# Patient Record
Sex: Female | Born: 1970 | Race: Black or African American | Hispanic: No | Marital: Single | Smoking: Current every day smoker
Health system: Southern US, Community
[De-identification: ages and names within clinical notes are randomized; demographics above are authoritative.]

## PROBLEM LIST (undated history)

## (undated) DIAGNOSIS — J449 Chronic obstructive pulmonary disease, unspecified: Secondary | ICD-10-CM

## (undated) DIAGNOSIS — K8020 Calculus of gallbladder without cholecystitis without obstruction: Secondary | ICD-10-CM

## (undated) DIAGNOSIS — F909 Attention-deficit hyperactivity disorder, unspecified type: Secondary | ICD-10-CM

## (undated) DIAGNOSIS — G629 Polyneuropathy, unspecified: Secondary | ICD-10-CM

## (undated) DIAGNOSIS — E781 Pure hyperglyceridemia: Secondary | ICD-10-CM

## (undated) DIAGNOSIS — F4310 Post-traumatic stress disorder, unspecified: Secondary | ICD-10-CM

## (undated) DIAGNOSIS — F32A Depression, unspecified: Secondary | ICD-10-CM

## (undated) DIAGNOSIS — F329 Major depressive disorder, single episode, unspecified: Secondary | ICD-10-CM

## (undated) DIAGNOSIS — F319 Bipolar disorder, unspecified: Secondary | ICD-10-CM

## (undated) DIAGNOSIS — E119 Type 2 diabetes mellitus without complications: Secondary | ICD-10-CM

## (undated) DIAGNOSIS — F419 Anxiety disorder, unspecified: Secondary | ICD-10-CM

## (undated) DIAGNOSIS — E785 Hyperlipidemia, unspecified: Secondary | ICD-10-CM

## (undated) HISTORY — DX: Post-traumatic stress disorder, unspecified: F43.10

## (undated) HISTORY — DX: Attention-deficit hyperactivity disorder, unspecified type: F90.9

---

## 1997-09-12 ENCOUNTER — Inpatient Hospital Stay (HOSPITAL_COMMUNITY): Admission: AD | Admit: 1997-09-12 | Discharge: 1997-09-12 | Payer: Self-pay | Admitting: Obstetrics

## 1998-03-04 ENCOUNTER — Inpatient Hospital Stay (HOSPITAL_COMMUNITY): Admission: AD | Admit: 1998-03-04 | Discharge: 1998-03-08 | Payer: Self-pay | Admitting: Obstetrics

## 1998-03-31 ENCOUNTER — Observation Stay (HOSPITAL_COMMUNITY): Admission: AD | Admit: 1998-03-31 | Discharge: 1998-04-01 | Payer: Self-pay | Admitting: Obstetrics

## 1998-04-13 ENCOUNTER — Inpatient Hospital Stay (HOSPITAL_COMMUNITY): Admission: AD | Admit: 1998-04-13 | Discharge: 1998-04-13 | Payer: Self-pay | Admitting: Obstetrics & Gynecology

## 1998-04-15 ENCOUNTER — Inpatient Hospital Stay (HOSPITAL_COMMUNITY): Admission: AD | Admit: 1998-04-15 | Discharge: 1998-04-17 | Payer: Self-pay | Admitting: Obstetrics

## 1998-05-22 ENCOUNTER — Emergency Department (HOSPITAL_COMMUNITY): Admission: EM | Admit: 1998-05-22 | Discharge: 1998-05-22 | Payer: Self-pay | Admitting: Emergency Medicine

## 1998-05-22 ENCOUNTER — Inpatient Hospital Stay (HOSPITAL_COMMUNITY): Admission: AD | Admit: 1998-05-22 | Discharge: 1998-05-25 | Payer: Self-pay | Admitting: Obstetrics

## 1998-05-23 ENCOUNTER — Encounter: Payer: Self-pay | Admitting: Obstetrics

## 1998-07-18 ENCOUNTER — Encounter: Admission: RE | Admit: 1998-07-18 | Discharge: 1998-07-18 | Payer: Self-pay | Admitting: Family Medicine

## 1998-09-12 ENCOUNTER — Encounter: Admission: RE | Admit: 1998-09-12 | Discharge: 1998-09-12 | Payer: Self-pay | Admitting: Family Medicine

## 2004-04-03 ENCOUNTER — Inpatient Hospital Stay (HOSPITAL_COMMUNITY): Admission: AD | Admit: 2004-04-03 | Discharge: 2004-04-03 | Payer: Self-pay | Admitting: Obstetrics & Gynecology

## 2004-04-14 ENCOUNTER — Ambulatory Visit: Payer: Self-pay | Admitting: Family Medicine

## 2004-04-29 ENCOUNTER — Ambulatory Visit: Payer: Self-pay | Admitting: Sports Medicine

## 2004-04-30 ENCOUNTER — Other Ambulatory Visit: Admission: RE | Admit: 2004-04-30 | Discharge: 2004-04-30 | Payer: Self-pay | Admitting: Family Medicine

## 2004-05-06 ENCOUNTER — Ambulatory Visit (HOSPITAL_COMMUNITY): Admission: RE | Admit: 2004-05-06 | Discharge: 2004-05-06 | Payer: Self-pay | Admitting: Family Medicine

## 2004-05-30 ENCOUNTER — Ambulatory Visit: Payer: Self-pay | Admitting: Sports Medicine

## 2004-06-17 ENCOUNTER — Ambulatory Visit: Payer: Self-pay | Admitting: Sports Medicine

## 2004-07-10 ENCOUNTER — Ambulatory Visit: Payer: Self-pay | Admitting: Family Medicine

## 2004-07-16 ENCOUNTER — Ambulatory Visit: Payer: Self-pay

## 2004-07-17 ENCOUNTER — Inpatient Hospital Stay (HOSPITAL_COMMUNITY): Admission: AD | Admit: 2004-07-17 | Discharge: 2004-07-17 | Payer: Self-pay | Admitting: *Deleted

## 2004-08-04 ENCOUNTER — Inpatient Hospital Stay (HOSPITAL_COMMUNITY): Admission: AD | Admit: 2004-08-04 | Discharge: 2004-08-04 | Payer: Self-pay | Admitting: Obstetrics and Gynecology

## 2004-08-04 ENCOUNTER — Ambulatory Visit: Payer: Self-pay | Admitting: Family Medicine

## 2004-09-02 ENCOUNTER — Observation Stay (HOSPITAL_COMMUNITY): Admission: AD | Admit: 2004-09-02 | Discharge: 2004-09-03 | Payer: Self-pay | Admitting: Family Medicine

## 2004-09-09 ENCOUNTER — Ambulatory Visit: Payer: Self-pay | Admitting: Family Medicine

## 2004-09-18 ENCOUNTER — Ambulatory Visit: Payer: Self-pay | Admitting: Sports Medicine

## 2004-10-09 ENCOUNTER — Ambulatory Visit: Payer: Self-pay | Admitting: Family Medicine

## 2004-10-09 ENCOUNTER — Inpatient Hospital Stay (HOSPITAL_COMMUNITY): Admission: AD | Admit: 2004-10-09 | Discharge: 2004-10-11 | Payer: Self-pay | Admitting: Obstetrics & Gynecology

## 2005-01-02 ENCOUNTER — Ambulatory Visit: Payer: Self-pay | Admitting: Sports Medicine

## 2005-11-11 ENCOUNTER — Encounter: Payer: Self-pay | Admitting: Obstetrics

## 2005-12-11 ENCOUNTER — Encounter (INDEPENDENT_AMBULATORY_CARE_PROVIDER_SITE_OTHER): Payer: Self-pay | Admitting: *Deleted

## 2005-12-11 LAB — CONVERTED CEMR LAB

## 2006-02-08 ENCOUNTER — Ambulatory Visit: Payer: Self-pay | Admitting: *Deleted

## 2006-02-08 ENCOUNTER — Inpatient Hospital Stay (HOSPITAL_COMMUNITY): Admission: AD | Admit: 2006-02-08 | Discharge: 2006-02-08 | Payer: Self-pay | Admitting: Family Medicine

## 2006-06-18 ENCOUNTER — Encounter (INDEPENDENT_AMBULATORY_CARE_PROVIDER_SITE_OTHER): Payer: Self-pay | Admitting: *Deleted

## 2006-06-18 ENCOUNTER — Ambulatory Visit: Payer: Self-pay | Admitting: Obstetrics & Gynecology

## 2006-06-18 ENCOUNTER — Inpatient Hospital Stay (HOSPITAL_COMMUNITY): Admission: AD | Admit: 2006-06-18 | Discharge: 2006-06-19 | Payer: Self-pay | Admitting: Obstetrics & Gynecology

## 2006-07-19 ENCOUNTER — Ambulatory Visit: Payer: Self-pay | Admitting: Family Medicine

## 2006-09-10 ENCOUNTER — Encounter (INDEPENDENT_AMBULATORY_CARE_PROVIDER_SITE_OTHER): Payer: Self-pay | Admitting: *Deleted

## 2006-10-07 ENCOUNTER — Ambulatory Visit: Payer: Self-pay | Admitting: Family Medicine

## 2006-12-28 ENCOUNTER — Ambulatory Visit: Payer: Self-pay | Admitting: Family Medicine

## 2007-03-28 ENCOUNTER — Ambulatory Visit: Payer: Self-pay | Admitting: Family Medicine

## 2007-04-08 ENCOUNTER — Emergency Department (HOSPITAL_COMMUNITY): Admission: EM | Admit: 2007-04-08 | Discharge: 2007-04-08 | Payer: Self-pay | Admitting: Emergency Medicine

## 2007-04-11 ENCOUNTER — Telehealth: Payer: Self-pay | Admitting: *Deleted

## 2007-04-12 ENCOUNTER — Ambulatory Visit: Payer: Self-pay | Admitting: Family Medicine

## 2007-04-12 DIAGNOSIS — S46819A Strain of other muscles, fascia and tendons at shoulder and upper arm level, unspecified arm, initial encounter: Secondary | ICD-10-CM | POA: Insufficient documentation

## 2007-04-19 ENCOUNTER — Encounter: Payer: Self-pay | Admitting: *Deleted

## 2007-06-27 ENCOUNTER — Ambulatory Visit: Payer: Self-pay | Admitting: Family Medicine

## 2007-09-20 ENCOUNTER — Ambulatory Visit: Payer: Self-pay | Admitting: Family Medicine

## 2008-07-22 ENCOUNTER — Emergency Department (HOSPITAL_COMMUNITY): Admission: EM | Admit: 2008-07-22 | Discharge: 2008-07-22 | Payer: Self-pay | Admitting: Emergency Medicine

## 2008-09-26 ENCOUNTER — Emergency Department (HOSPITAL_COMMUNITY): Admission: EM | Admit: 2008-09-26 | Discharge: 2008-09-26 | Payer: Self-pay | Admitting: Emergency Medicine

## 2008-10-16 ENCOUNTER — Telehealth: Payer: Self-pay | Admitting: Family Medicine

## 2008-10-17 ENCOUNTER — Telehealth: Payer: Self-pay | Admitting: Family Medicine

## 2008-10-18 ENCOUNTER — Ambulatory Visit: Payer: Self-pay | Admitting: Family Medicine

## 2008-10-18 DIAGNOSIS — F329 Major depressive disorder, single episode, unspecified: Secondary | ICD-10-CM

## 2008-10-18 DIAGNOSIS — R05 Cough: Secondary | ICD-10-CM

## 2008-10-18 DIAGNOSIS — F17200 Nicotine dependence, unspecified, uncomplicated: Secondary | ICD-10-CM

## 2008-10-18 DIAGNOSIS — R059 Cough, unspecified: Secondary | ICD-10-CM | POA: Insufficient documentation

## 2009-04-25 ENCOUNTER — Emergency Department (HOSPITAL_COMMUNITY): Admission: EM | Admit: 2009-04-25 | Discharge: 2009-04-25 | Payer: Self-pay | Admitting: Emergency Medicine

## 2009-05-06 ENCOUNTER — Other Ambulatory Visit: Payer: Self-pay

## 2009-05-06 ENCOUNTER — Other Ambulatory Visit: Payer: Self-pay | Admitting: Emergency Medicine

## 2009-05-06 ENCOUNTER — Ambulatory Visit: Payer: Self-pay | Admitting: Family Medicine

## 2009-05-06 DIAGNOSIS — R109 Unspecified abdominal pain: Secondary | ICD-10-CM | POA: Insufficient documentation

## 2009-05-07 ENCOUNTER — Inpatient Hospital Stay (HOSPITAL_COMMUNITY): Admission: AD | Admit: 2009-05-07 | Discharge: 2009-05-10 | Payer: Self-pay | Admitting: Psychiatry

## 2009-05-07 ENCOUNTER — Other Ambulatory Visit: Payer: Self-pay | Admitting: Emergency Medicine

## 2009-05-07 ENCOUNTER — Ambulatory Visit: Payer: Self-pay | Admitting: Psychiatry

## 2009-05-09 ENCOUNTER — Encounter: Payer: Self-pay | Admitting: Family Medicine

## 2009-05-13 ENCOUNTER — Encounter: Payer: Self-pay | Admitting: *Deleted

## 2009-06-02 ENCOUNTER — Emergency Department (HOSPITAL_COMMUNITY): Admission: EM | Admit: 2009-06-02 | Discharge: 2009-06-02 | Payer: Self-pay | Admitting: Emergency Medicine

## 2009-07-31 ENCOUNTER — Inpatient Hospital Stay (HOSPITAL_COMMUNITY): Admission: AD | Admit: 2009-07-31 | Discharge: 2009-08-01 | Payer: Self-pay | Admitting: Obstetrics & Gynecology

## 2010-03-26 ENCOUNTER — Emergency Department (HOSPITAL_COMMUNITY): Admission: EM | Admit: 2010-03-26 | Discharge: 2010-03-26 | Payer: Self-pay | Admitting: Emergency Medicine

## 2010-03-28 ENCOUNTER — Telehealth: Payer: Self-pay | Admitting: *Deleted

## 2010-03-28 ENCOUNTER — Emergency Department (HOSPITAL_COMMUNITY): Admission: EM | Admit: 2010-03-28 | Discharge: 2010-03-28 | Payer: Self-pay | Admitting: Family Medicine

## 2010-04-02 ENCOUNTER — Encounter: Payer: Self-pay | Admitting: Family Medicine

## 2010-04-02 ENCOUNTER — Ambulatory Visit: Payer: Self-pay | Admitting: Family Medicine

## 2010-04-02 DIAGNOSIS — R5383 Other fatigue: Secondary | ICD-10-CM

## 2010-04-02 DIAGNOSIS — E049 Nontoxic goiter, unspecified: Secondary | ICD-10-CM | POA: Insufficient documentation

## 2010-04-02 DIAGNOSIS — R5381 Other malaise: Secondary | ICD-10-CM

## 2010-04-04 ENCOUNTER — Telehealth: Payer: Self-pay | Admitting: *Deleted

## 2010-04-04 LAB — CONVERTED CEMR LAB
ALT: 22 units/L (ref 0–35)
AST: 20 units/L (ref 0–37)
CO2: 23 meq/L (ref 19–32)
Calcium: 9.4 mg/dL (ref 8.4–10.5)
Chloride: 106 meq/L (ref 96–112)
Platelets: 215 10*3/uL (ref 150–400)
RDW: 15.1 % (ref 11.5–15.5)
Sodium: 140 meq/L (ref 135–145)
TSH: 2.195 microintl units/mL (ref 0.350–4.500)
Total Protein: 6.5 g/dL (ref 6.0–8.3)
WBC: 6 10*3/uL (ref 4.0–10.5)
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
## 2010-08-12 NOTE — Progress Notes (Signed)
  Phone Note Outgoing Call   Call placed by: Loralee Pacas CMA,  April 04, 2010 3:36 PM Summary of Call: called and informed pt of nl tsh results and she can repeat in 6 months  Initial call taken by: Loralee Pacas CMA,  April 04, 2010 3:42 PM  Follow-up for Phone Call        thanks

## 2010-09-28 LAB — COMPREHENSIVE METABOLIC PANEL
ALT: 39 U/L — ABNORMAL HIGH (ref 0–35)
BUN: 9 mg/dL (ref 6–23)
CO2: 26 mEq/L (ref 19–32)
Calcium: 9.6 mg/dL (ref 8.4–10.5)
Creatinine, Ser: 1.12 mg/dL (ref 0.4–1.2)
GFR calc non Af Amer: 54 mL/min — ABNORMAL LOW (ref >60)
Glucose, Bld: 99 mg/dL (ref 70–99)
Sodium: 138 mEq/L (ref 135–145)
Total Protein: 6.7 g/dL (ref 6.0–8.3)

## 2010-09-28 LAB — DIFFERENTIAL
Eosinophils Absolute: 0.1 10*3/uL (ref 0.0–0.7)
Lymphocytes Relative: 28 % (ref 12–46)
Lymphs Abs: 2.9 10*3/uL (ref 0.7–4.0)
Monocytes Relative: 9 % (ref 3–12)
Neutro Abs: 6.2 10*3/uL (ref 1.7–7.7)
Neutrophils Relative %: 61 % (ref 43–77)

## 2010-09-28 LAB — WET PREP, GENITAL: Clue Cells Wet Prep HPF POC: NONE SEEN

## 2010-09-28 LAB — CBC
HCT: 37 % (ref 36.0–46.0)
Hemoglobin: 12 g/dL (ref 12.0–15.0)
MCHC: 32.5 g/dL (ref 30.0–36.0)
MCV: 89.2 fL (ref 78.0–100.0)
RBC: 4.15 MIL/uL (ref 3.87–5.11)
RDW: 16.2 % — ABNORMAL HIGH (ref 11.5–15.5)

## 2010-09-28 LAB — URINALYSIS, ROUTINE W REFLEX MICROSCOPIC
Bilirubin Urine: NEGATIVE
Ketones, ur: NEGATIVE mg/dL
Nitrite: POSITIVE — AB
Urobilinogen, UA: 0.2 mg/dL (ref 0.0–1.0)
pH: 5.5 (ref 5.0–8.0)

## 2010-09-28 LAB — GC/CHLAMYDIA PROBE AMP, GENITAL
Chlamydia, DNA Probe: NEGATIVE
GC Probe Amp, Genital: NEGATIVE

## 2010-09-28 LAB — POCT PREGNANCY, URINE: Preg Test, Ur: NEGATIVE

## 2010-09-28 LAB — URINE MICROSCOPIC-ADD ON

## 2010-10-16 LAB — RAPID URINE DRUG SCREEN, HOSP PERFORMED
Amphetamines: NOT DETECTED
Barbiturates: POSITIVE — AB
Cocaine: NOT DETECTED
Opiates: NOT DETECTED
Tetrahydrocannabinol: POSITIVE — AB

## 2010-10-16 LAB — BASIC METABOLIC PANEL
BUN: 11 mg/dL (ref 6–23)
CO2: 26 mEq/L (ref 19–32)
Calcium: 9.2 mg/dL (ref 8.4–10.5)
Chloride: 106 mEq/L (ref 96–112)
Creatinine, Ser: 1 mg/dL (ref 0.4–1.2)
GFR calc Af Amer: >60 mL/min (ref >60)
GFR calc non Af Amer: >60 mL/min (ref >60)
Glucose, Bld: 109 mg/dL — ABNORMAL HIGH (ref 70–99)
Potassium: 3.9 mEq/L (ref 3.5–5.1)
Sodium: 138 mEq/L (ref 135–145)

## 2010-10-16 LAB — CBC
HCT: 34.6 % — ABNORMAL LOW (ref 36.0–46.0)
Hemoglobin: 11.7 g/dL — ABNORMAL LOW (ref 12.0–15.0)
MCHC: 33.9 g/dL (ref 30.0–36.0)
MCV: 92.3 fL (ref 78.0–100.0)
Platelets: 246 10*3/uL (ref 150–400)
RBC: 3.75 MIL/uL — ABNORMAL LOW (ref 3.87–5.11)
RDW: 14.7 % (ref 11.5–15.5)
WBC: 6.6 10*3/uL (ref 4.0–10.5)

## 2010-10-16 LAB — DIFFERENTIAL
Basophils Absolute: 0.1 10*3/uL (ref 0.0–0.1)
Basophils Relative: 1 % (ref 0–1)
Eosinophils Absolute: 0.1 10*3/uL (ref 0.0–0.7)
Eosinophils Relative: 2 % (ref 0–5)
Lymphocytes Relative: 47 % — ABNORMAL HIGH (ref 12–46)
Lymphs Abs: 3.1 10*3/uL (ref 0.7–4.0)
Monocytes Absolute: 0.7 10*3/uL (ref 0.1–1.0)
Monocytes Relative: 11 % (ref 3–12)
Neutro Abs: 2.6 10*3/uL (ref 1.7–7.7)
Neutrophils Relative %: 40 % — ABNORMAL LOW (ref 43–77)

## 2010-10-23 LAB — DIFFERENTIAL
Basophils Relative: 0 % (ref 0–1)
Lymphocytes Relative: 12 % (ref 12–46)
Lymphs Abs: 1.7 10*3/uL (ref 0.7–4.0)
Monocytes Relative: 9 % (ref 3–12)
Neutro Abs: 11.5 10*3/uL — ABNORMAL HIGH (ref 1.7–7.7)
Neutrophils Relative %: 80 % — ABNORMAL HIGH (ref 43–77)

## 2010-10-23 LAB — URINALYSIS, ROUTINE W REFLEX MICROSCOPIC
Glucose, UA: NEGATIVE mg/dL
Protein, ur: 30 mg/dL — AB

## 2010-10-23 LAB — COMPREHENSIVE METABOLIC PANEL
ALT: 30 U/L (ref 0–35)
AST: 26 U/L (ref 0–37)
Albumin: 3.9 g/dL (ref 3.5–5.2)
Calcium: 9.3 mg/dL (ref 8.4–10.5)
GFR calc Af Amer: >60 mL/min (ref >60)
Sodium: 136 mEq/L (ref 135–145)
Total Protein: 7.1 g/dL (ref 6.0–8.3)

## 2010-10-23 LAB — CBC
MCHC: 34 g/dL (ref 30.0–36.0)
Platelets: 169 10*3/uL (ref 150–400)
RDW: 14.6 % (ref 11.5–15.5)

## 2010-10-23 LAB — URINE MICROSCOPIC-ADD ON

## 2010-10-23 LAB — POCT PREGNANCY, URINE: Preg Test, Ur: NEGATIVE

## 2010-11-28 NOTE — Discharge Summary (Signed)
NAME:  Stacy Pierce, Stacy Pierce NO.:  [id number]   MEDICAL RECORD NO.:  [id number]          PATIENT TYPE:  INP   LOCATION:  9151                          FACILITY:  [facility]   PHYSICIAN:  Conni Elliot, M.D.DATE OF BIRTH:  1971-05-16   DATE OF ADMISSION:  09/02/2004  DATE OF DISCHARGE:  09/03/2004                                 DISCHARGE SUMMARY   ADMISSION DIAGNOSES:  38.  40 year old gravida 4, para 3-0-0-3, at 35-3/7 weeks with cough,      vomiting, and diarrhea.  2.  Positive tobacco use.  3.  Failed one-hour Glucola.  4.  History of placenta previa.   DISCHARGE DIAGNOSES:  34.  40 year old gravida 4, para 3-0-0-3, at 35-4/7 weeks with resolved      vomiting and diarrhea.  2.  Acute bronchitis.  3.  Tobacco abuse.  4.  Failed one-hour Glucola.  5.  History of placenta previa.  6.  Reassuring fetal heart rate tracing.   DISCHARGE MEDICATIONS:  1.  Zithromax 250 mg one p.o. daily x4 more days.  2.  Prenatal vitamins one p.o. daily.   HISTORY OF PRESENT ILLNESS:  Ms. Stacy Pierce were admitted to [facility]  with symptoms of acute bronchitis with a history of tobacco use as well as  vomiting and diarrhea.  She had greater than 80 ketones in her urine, both  before and after 2 liters of IV fluids.  She was admitted for rehydration  and antibiotics.   HOSPITAL COURSE:  Problem 1.  Acute bronchitis.  Her chest x-ray was within  normal limits.  Her lung examination did show rhonchi bilaterally.  She was  not hypoxic.  She was started on 500 mg of IV Zithromax and she received  that dose once.  She remained afebrile throughout her hospitalization.   Problem 2.  Vomiting and diarrhea.  She did come in obvious dehydrated.  Her  ketones resolved after IV fluids for about 12 hours.  Prior to her discharge  on hospital day #1, she was taking solid p.o. without nausea or vomiting.   Problem 3.  History of a failed one-hour Glucola.  She is to have a three-  hour  scheduled.  She should be recommended an ADA diet to follow regardless  of whether or not she has the three-hour test since her one-hour was so  high.   Problem 4.  Tobacco use.  She was recommended smoking cessation.  The  patient states that she does want to try.   Problem 5.  History of placenta previa.  An ultrasound was not done today  since she had no complaints of vaginal bleeding, loss of fluid, or  contractions.  This will also need to be followed up as an outpatient.   CONDITION ON DISCHARGE:  The patient was discharged to home in stable  condition.   DISCHARGE INSTRUCTIONS:  The patient was told of the above regimen as well  as smoking cessation and low carbohydrate diet.  She was told to make an  appointment with Hansel Feinstein, M.D. if she does not already have one  scheduled.      LC/MEDQ  D:  09/03/2004  T:  09/04/2004  Job:  [id number]   cc:   Hansel Feinstein, M.D.

## 2011-04-17 ENCOUNTER — Emergency Department (HOSPITAL_COMMUNITY): Payer: Self-pay

## 2011-04-17 ENCOUNTER — Emergency Department (HOSPITAL_COMMUNITY)
Admission: EM | Admit: 2011-04-17 | Discharge: 2011-04-18 | Disposition: A | Discharge status: Home / Self Care | Payer: Self-pay | Attending: Emergency Medicine | Admitting: Emergency Medicine

## 2011-04-17 ENCOUNTER — Emergency Department (HOSPITAL_COMMUNITY)
Admission: EM | Admit: 2011-04-17 | Discharge: 2011-04-17 | Disposition: A | Discharge status: Home / Self Care | Payer: Self-pay | Attending: Emergency Medicine | Admitting: Emergency Medicine

## 2011-04-17 DIAGNOSIS — J40 Bronchitis, not specified as acute or chronic: Secondary | ICD-10-CM | POA: Insufficient documentation

## 2011-04-17 DIAGNOSIS — R05 Cough: Secondary | ICD-10-CM | POA: Insufficient documentation

## 2011-04-17 DIAGNOSIS — R509 Fever, unspecified: Secondary | ICD-10-CM | POA: Insufficient documentation

## 2011-04-17 DIAGNOSIS — R062 Wheezing: Secondary | ICD-10-CM | POA: Insufficient documentation

## 2011-04-17 DIAGNOSIS — R059 Cough, unspecified: Secondary | ICD-10-CM | POA: Insufficient documentation

## 2011-04-17 DIAGNOSIS — R0602 Shortness of breath: Secondary | ICD-10-CM | POA: Insufficient documentation

## 2011-08-14 ENCOUNTER — Other Ambulatory Visit: Payer: Self-pay | Admitting: Family Medicine

## 2011-11-07 HISTORY — PX: CHOLECYSTECTOMY: SHX55

## 2011-11-08 ENCOUNTER — Emergency Department (HOSPITAL_COMMUNITY): Payer: Medicaid Other

## 2011-11-08 ENCOUNTER — Emergency Department (HOSPITAL_COMMUNITY)
Admission: EM | Admit: 2011-11-08 | Discharge: 2011-11-08 | Disposition: A | Discharge status: Home / Self Care | Payer: Medicaid Other | Attending: Emergency Medicine | Admitting: Emergency Medicine

## 2011-11-08 ENCOUNTER — Encounter (HOSPITAL_COMMUNITY): Payer: Self-pay | Admitting: *Deleted

## 2011-11-08 DIAGNOSIS — K8020 Calculus of gallbladder without cholecystitis without obstruction: Secondary | ICD-10-CM | POA: Insufficient documentation

## 2011-11-08 DIAGNOSIS — R079 Chest pain, unspecified: Secondary | ICD-10-CM | POA: Insufficient documentation

## 2011-11-08 DIAGNOSIS — J449 Chronic obstructive pulmonary disease, unspecified: Secondary | ICD-10-CM | POA: Insufficient documentation

## 2011-11-08 DIAGNOSIS — R1011 Right upper quadrant pain: Secondary | ICD-10-CM | POA: Insufficient documentation

## 2011-11-08 DIAGNOSIS — J4489 Other specified chronic obstructive pulmonary disease: Secondary | ICD-10-CM | POA: Insufficient documentation

## 2011-11-08 DIAGNOSIS — K810 Acute cholecystitis: Secondary | ICD-10-CM

## 2011-11-08 LAB — DIFFERENTIAL
Eosinophils Absolute: 0.1 10*3/uL (ref 0.0–0.7)
Eosinophils Relative: 1 % (ref 0–5)
Lymphs Abs: 3.4 10*3/uL (ref 0.7–4.0)
Monocytes Relative: 9 % (ref 3–12)

## 2011-11-08 LAB — TROPONIN I: Troponin I: <0.3 ng/mL (ref <0.30)

## 2011-11-08 LAB — COMPREHENSIVE METABOLIC PANEL
BUN: 8 mg/dL (ref 6–23)
Calcium: 9.6 mg/dL (ref 8.4–10.5)
Creatinine, Ser: 0.82 mg/dL (ref 0.50–1.10)
GFR calc Af Amer: >90 mL/min (ref >90)
Glucose, Bld: 117 mg/dL — ABNORMAL HIGH (ref 70–99)
Total Protein: 7 g/dL (ref 6.0–8.3)

## 2011-11-08 LAB — LIPASE, BLOOD: Lipase: 27 U/L (ref 11–59)

## 2011-11-08 LAB — CBC
MCHC: 35.5 g/dL (ref 30.0–36.0)
RDW: 14 % (ref 11.5–15.5)

## 2011-11-08 LAB — CK TOTAL AND CKMB (NOT AT ARMC)
Relative Index: 1 (ref 0.0–2.5)
Total CK: 191 U/L — ABNORMAL HIGH (ref 7–177)

## 2011-11-08 MED ORDER — OXYCODONE-ACETAMINOPHEN 5-325 MG PO TABS: 2.0000 | ORAL_TABLET | ORAL | Status: DC | PRN

## 2011-11-08 MED ORDER — NICOTINE 21 MG/24HR TD PT24
21.0000 mg | MEDICATED_PATCH | Freq: Once | TRANSDERMAL | Status: DC
  Administered 2011-11-08: 21 mg via TRANSDERMAL

## 2011-11-08 MED ORDER — LORAZEPAM 2 MG/ML IJ SOLN
1.0000 mg | Freq: Once | INTRAMUSCULAR | Status: AC
  Administered 2011-11-08: 1 mg via INTRAVENOUS

## 2011-11-08 MED ORDER — ONDANSETRON HCL 4 MG/2ML IJ SOLN
INTRAMUSCULAR | Status: AC
  Administered 2011-11-08: 4 mg via INTRAVENOUS
  Filled 2011-11-08: qty 2

## 2011-11-08 MED ORDER — ALBUTEROL SULFATE (5 MG/ML) 0.5% IN NEBU
2.5000 mg | INHALATION_SOLUTION | Freq: Once | RESPIRATORY_TRACT | Status: AC
  Administered 2011-11-08: 2.5 mg via RESPIRATORY_TRACT
  Filled 2011-11-08: qty 0.5

## 2011-11-08 MED ORDER — HYDROMORPHONE HCL PF 1 MG/ML IJ SOLN
INTRAMUSCULAR | Status: AC
  Administered 2011-11-08: 0.5 mg via INTRAVENOUS
  Filled 2011-11-08: qty 1

## 2011-11-08 MED ORDER — HYDROMORPHONE HCL PF 1 MG/ML IJ SOLN
0.5000 mg | Freq: Once | INTRAMUSCULAR | Status: AC
  Administered 2011-11-08: 0.5 mg via INTRAVENOUS

## 2011-11-08 MED ORDER — ONDANSETRON HCL 4 MG PO TABS: 4.0000 mg | ORAL_TABLET | Freq: Four times a day (QID) | ORAL | Status: AC

## 2011-11-08 MED ORDER — NICOTINE 21 MG/24HR TD PT24
MEDICATED_PATCH | TRANSDERMAL | Status: AC
  Filled 2011-11-08: qty 1

## 2011-11-08 MED ORDER — LORAZEPAM 2 MG/ML IJ SOLN
INTRAMUSCULAR | Status: AC
  Filled 2011-11-08: qty 1

## 2011-11-08 MED ORDER — ONDANSETRON HCL 4 MG/2ML IJ SOLN
4.0000 mg | Freq: Once | INTRAMUSCULAR | Status: AC
  Administered 2011-11-08: 4 mg via INTRAVENOUS
  Filled 2011-11-08: qty 2

## 2011-11-08 MED ORDER — HYDROMORPHONE HCL PF 1 MG/ML IJ SOLN
1.0000 mg | Freq: Once | INTRAMUSCULAR | Status: AC
  Administered 2011-11-08: 1 mg via INTRAVENOUS
  Filled 2011-11-08: qty 1

## 2011-11-08 MED ORDER — ONDANSETRON HCL 4 MG/2ML IJ SOLN
4.0000 mg | Freq: Once | INTRAMUSCULAR | Status: AC
  Administered 2011-11-08: 4 mg via INTRAVENOUS

## 2011-11-08 NOTE — ED Notes (Signed)
Pt called out.  RN went into room and patient started yelling loudly at staff saying she needed to go smoke a cigarette and her mouth was dry. Explained to patient reasoning for keeping NPO and no smoking policy, offered nicotine patch and mouth swabs.  Pt continuing to yell at staff. Dr. Norlene Campbell into room and spoke with patient.  New orders for nicotine patch and ativan.  Gave pt mouth swabs and discussed plan of care.

## 2011-11-08 NOTE — Discharge Instructions (Signed)
Biliary Colic  Biliary colic is a steady or irregular pain in the upper abdomen. It is usually under the right side of the rib cage. It happens when gallstones interfere with the normal flow of bile from the gallbladder. Bile is a liquid that helps to digest fats. Bile is made in the liver and stored in the gallbladder. When you eat a meal, bile passes from the gallbladder through the cystic duct and the common bile duct into the small intestine. There, it mixes with partially digested food. If a gallstone blocks either of these ducts, the normal flow of bile is blocked. The muscle cells in the bile duct contract forcefully to try to move the stone. This causes the pain of biliary colic.  SYMPTOMS   A person with biliary colic usually complains of pain in the upper abdomen. This pain can be:   In the center of the upper abdomen just below the breastbone.   In the upper-right part of the abdomen, near the gallbladder and liver.   Spread back toward the right shoulder blade.   Nausea and vomiting.   The pain usually occurs after eating.   Biliary colic is usually triggered by the digestive system's demand for bile. The demand for bile is high after fatty meals. Symptoms can also occur when a person who has been fasting suddenly eats a very large meal. Most episodes of biliary colic pass after 1 to 5 hours. After the most intense pain passes, your abdomen may continue to ache mildly for about 24 hours.  DIAGNOSIS  After you describe your symptoms, your caregiver will perform a physical exam. He or she will pay attention to the upper right portion of your belly (abdomen). This is the area of your liver and gallbladder. An ultrasound will help your caregiver look for gallstones. Specialized scans of the gallbladder may also be done. Blood tests may be done, especially if you have fever or if your pain persists. PREVENTION  Biliary colic can be prevented by controlling the risk factors for gallstones.  Some of these risk factors, such as heredity, increasing age, and pregnancy are a normal part of life. Obesity and a high-fat diet are risk factors you can change through a healthy lifestyle. Women going through menopause who take hormone replacement therapy (estrogen) are also more likely to develop biliary colic. TREATMENT   Pain medication may be prescribed.   You may be encouraged to eat a fat-free diet.   If the first episode of biliary colic is severe, or episodes of colic keep retuning, surgery to remove the gallbladder (cholecystectomy) is usually recommended. This procedure can be done through small incisions using an instrument called a laparoscope. The procedure often requires a brief stay in the hospital. Some people can leave the hospital the same day. It is the most widely used treatment in people troubled by painful gallstones. It is effective and safe, with no complications in more than 90% of cases.   If surgery cannot be done, medication that dissolves gallstones may be used. This medication is expensive and can take months or years to work. Only small stones will dissolve.   Rarely, medication to dissolve gallstones is combined with a procedure called shock-wave lithotripsy. This procedure uses carefully aimed shock waves to break up gallstones. In many people treated with this procedure, gallstones form again within a few years.  PROGNOSIS  If gallstones block your cystic duct or common bile duct, you are at risk for repeated episodes of  biliary colic. There is also a 25% chance that you will develop a gallbladder infection(acute cholecystitis), or some other complication of gallstones within 10 to 20 years. If you have surgery, schedule it at a time that is convenient for you and at a time when you are not sick. HOME CARE INSTRUCTIONS   Drink plenty of clear fluids.   Avoid fatty, greasy or fried foods, or any foods that make your pain worse.   Take medications as directed.    SEEK MEDICAL CARE IF:   You develop a fever over 100.5 F (38.1 C).   Your pain gets worse over time.   You develop nausea that prevents you from eating and drinking.   You develop vomiting.  SEEK IMMEDIATE MEDICAL CARE IF:   You have continuous or severe belly (abdominal) pain which is not relieved with medications.   You develop nausea and vomiting which is not relieved with medications.   You have symptoms of biliary colic and you suddenly develop a fever and shaking chills. This may signal cholecystitis. Call your caregiver immediately.   You develop a yellow color to your skin or the white part of your eyes (jaundice).  Document Released: 11/30/2005 Document Revised: 06/18/2011 Document Reviewed: 02/09/2008 [facility] Patient Information 2012 [place], [place].  Cholelithiasis Cholelithiasis (also called gallstones) is a form of gallbladder disease where gallstones form in your gallbladder. The gallbladder is a non-essential organ that stores bile made in the liver, which helps digest fats. Gallstones begin as small crystals and slowly grow into stones. Gallstone pain occurs when the gallbladder spasms, and a gallstone is blocking the duct. Pain can also occur when a stone passes out of the duct.  Women are more likely to develop gallstones than men. Other factors that increase the risk of gallbladder disease are:  Having multiple pregnancies. Physicians sometimes advise removing diseased gallbladders before future pregnancies.   Obesity.   Diets heavy in fried foods and fat.   Increasing age (older than 90).   Prolonged use of medications containing female hormones.   Diabetes mellitus.   Rapid weight loss.   Family history of gallstones (heredity).  SYMPTOMS  Feeling sick to your stomach (nauseous).   Abdominal pain.   Yellowing of the skin (jaundice).   Sudden pain. It may persist from several minutes to several hours.   Worsening pain with deep breathing or  when jarred.   Fever.   Tenderness to the touch.  In some cases, when gallstones do not move into the bile duct, people have no pain or symptoms. These are called "silent" gallstones. TREATMENT In severe cases, emergency surgery may be required. HOME CARE INSTRUCTIONS   Only take over-the-counter or prescription medicines for pain, discomfort, or fever as directed by your caregiver.   Follow a low-fat diet until seen again. Fat causes the gallbladder to contract, which can result in pain.   Follow up as instructed. Attacks are almost always recurrent and surgery is usually required for permanent treatment.  SEEK IMMEDIATE MEDICAL CARE IF:   Your pain increases and is not controlled by medications.   You have an oral temperature above 102 F (38.9 C), not controlled by medication.   You develop nausea and vomiting.  MAKE SURE YOU:   Understand these instructions.   Will watch your condition.   Will get help right away if you are not doing well or get worse.  Document Released: 06/25/2005 Document Revised: 06/18/2011 Document Reviewed: 08/28/2010 [facility] Patient Information 2012 [place], [place].  Laparoscopic Cholecystectomy Laparoscopic cholecystectomy is surgery to remove the gallbladder. The gallbladder is located slightly to the right of center in the abdomen, behind the liver. It is a concentrating and storage sac for the bile produced in the liver. Bile aids in the digestion and absorption of fats. Gallbladder disease (cholecystitis) is an inflammation of your gallbladder. This condition is usually caused by a buildup of gallstones (cholelithiasis) in your gallbladder. Gallstones can block the flow of bile, resulting in inflammation and pain. In severe cases, emergency surgery may be required. When emergency surgery is not required, you will have time to prepare for the procedure. Laparoscopic surgery is an alternative to open surgery. Laparoscopic surgery usually has a  shorter recovery time. Your common bile duct may also need to be examined and explored. Your caregiver will discuss this with you if he or she feels this should be done. If stones are found in the common bile duct, they may be removed. LET YOUR CAREGIVER KNOW ABOUT:  Allergies to food or medicine.   Medicines taken, including vitamins, herbs, eyedrops, over-the-counter medicines, and creams.   Use of steroids (by mouth or creams).   Previous problems with anesthetics or numbing medicines.   History of bleeding problems or blood clots.   Previous surgery.   Other health problems, including diabetes and kidney problems.   Possibility of pregnancy, if this applies.  RISKS AND COMPLICATIONS All surgery is associated with risks. Some problems that may occur following this procedure include:  Infection.   Damage to the common bile duct, nerves, arteries, veins, or other internal organs such as the stomach or intestines.   Bleeding.   A stone may remain in the common bile duct.  BEFORE THE PROCEDURE  Do not take aspirin for 3 days prior to surgery or blood thinners for 1 week prior to surgery.   Do not eat or drink anything after midnight the night before surgery.   Let your caregiver know if you develop a cold or other infectious problem prior to surgery.   You should be present 60 minutes before the procedure or as directed.  PROCEDURE  You will be given medicine that makes you sleep (general anesthetic). When you are asleep, your surgeon will make several small cuts (incisions) in your abdomen. One of these incisions is used to insert a small, lighted scope (laparoscope) into the abdomen. The laparoscope helps the surgeon see into your abdomen. Carbon dioxide gas will be pumped into your abdomen. The gas allows more room for the surgeon to perform your surgery. Other operating instruments are inserted through the other incisions. Laparoscopic procedures may not be appropriate  when:  There is major scarring from previous surgery.   The gallbladder is extremely inflamed.   There are bleeding disorders or unexpected cirrhosis of the liver.   A pregnancy is near term.   Other conditions make the laparoscopic procedure impossible.  If your surgeon feels it is not safe to continue with a laparoscopic procedure, he or she will perform an open abdominal procedure. In this case, the surgeon will make an incision to open the abdomen. This gives the surgeon a larger view and field to work within. This may allow the surgeon to perform procedures that sometimes cannot be performed with a laparoscope alone. Open surgery has a longer recovery time. AFTER THE PROCEDURE  You will be taken to the recovery area where a nurse will watch and check your progress.   You may be allowed to go  home the same day.   Do not resume physical activities until directed by your caregiver.   You may resume a normal diet and activities as directed.  Document Released: 06/29/2005 Document Revised: 06/18/2011 Document Reviewed: 12/12/2010 [facility] Patient Information 2012 [place], [place].

## 2011-11-08 NOTE — ED Notes (Signed)
Pt called rn into room, emesis x1 noted and c/o chest pain. Currently awaiting EDP eval at this time

## 2011-11-08 NOTE — ED Provider Notes (Addendum)
History     CSN: [id number]  Arrival date & time 11/08/11  0040   First MD Initiated Contact with Patient 11/08/11 0054      Chief Complaint  Patient presents with  . Chest Pain    (Consider location/radiation/quality/duration/timing/severity/associated sxs/prior treatment) HPI 41 year old female presents to emergency department with complaint of one month of intermittent chest back and upper abdomen pain. Patient reports symptoms initially started after eating a barbecue a spicy peppers. Patient has had diaphoresis and nausea after eating along with the pain. Pain is mainly in right upper quadrant, radiates into her right shoulder and across her chest. Patient denies previous history of same. No fevers patient has had shortness of breath, but has history of COPD and is a smoker. No prior surgeries no family history of heart disease. Patient denies any leg swelling, not on birth control pills, no history of PE or DVT History reviewed. No pertinent past medical history.  History reviewed. No pertinent past surgical history.  No family history on file.  History  Substance Use Topics  . Smoking status: Current Everyday Smoker  . Smokeless tobacco: Not on file  . Alcohol Use: Yes    OB History    Grav Para Term Preterm Abortions TAB SAB Ect Mult Living                  Review of Systems  All other systems reviewed and are negative.    Allergies  Amoxicillin  Home Medications  No current outpatient prescriptions on file.  BP 120/84  Pulse 73  Temp(Src) 97.8 F (36.6 C) (Oral)  Resp 22  SpO2 99%  Physical Exam  Nursing note and vitals reviewed. Constitutional: She is oriented to person, place, and time. She appears well-developed and well-nourished.  HENT:  Head: Normocephalic and atraumatic.  Nose: Nose normal.  Mouth/Throat: Oropharynx is clear and moist.  Eyes: Conjunctivae and EOM are normal. Pupils are equal, round, and reactive to light.  Neck: Normal  range of motion. Neck supple. No JVD present. No tracheal deviation present. No thyromegaly present.  Cardiovascular: Normal rate, regular rhythm, normal heart sounds and intact distal pulses.  Exam reveals no gallop and no friction rub.   No murmur heard. Pulmonary/Chest: Effort normal. No stridor. No respiratory distress. She has wheezes. She has no rales. She exhibits no tenderness.  Abdominal: Soft. Bowel sounds are normal. She exhibits no distension and no mass. There is tenderness (significant tenderness in right upper quadrant with out rebound or guarding, tenderness in epigastrium as well). There is no rebound and no guarding.  Musculoskeletal: Normal range of motion. She exhibits no edema and no tenderness.  Lymphadenopathy:    She has no cervical adenopathy.  Neurological: She is oriented to person, place, and time. She exhibits normal muscle tone. Coordination normal.  Skin: Skin is dry. No rash noted. No erythema. No pallor.  Psychiatric: She has a normal mood and affect. Her behavior is normal. Judgment and thought content normal.    ED Course  Procedures (including critical care time)  Labs Reviewed  DIFFERENTIAL - Abnormal; Notable for the following:    Lymphocytes Relative 47 (*)    All other components within normal limits  COMPREHENSIVE METABOLIC PANEL - Abnormal; Notable for the following:    Potassium 3.1 (*)    Glucose, Bld 117 (*)    GFR calc non Af Amer 87 (*)    All other components within normal limits  CK TOTAL AND CKMB - Abnormal;  Notable for the following:    Total CK 191 (*)    All other components within normal limits  TROPONIN I  CBC  LIPASE, BLOOD   US Abdomen Complete  11/08/2011  *RADIOLOGY REPORT*  Clinical Data:  Chest pain, nausea, vomiting.  COMPLETE ABDOMINAL ULTRASOUND  Comparison:  None.  Findings:  Gallbladder:  Multiple echogenic foci without shadowing.  There is mild gallbladder wall thickening at 4 mm.  No pericholecystic fluid identified.   Per the sonographer, positive sonographic Murphy's sign.  Common bile duct:  Measures 2-3 mm, within normal limits.  Liver:  There is a 1 cm echogenic focus within the left hepatic lobe.  There appears to be increased through transmission. Otherwise, the liver demonstrates homogeneous echogenicity.  IVC:  Appears normal.  Pancreas:  No focal abnormality seen.  Spleen:  Within normal limits, measuring 5 cm.  Right Kidney:  Measures 11.2 cm.  No hydronephrosis or focal lesion.  Left Kidney:  Measures 10.3 cm.  No hydronephrosis or focal lesion.  Abdominal aorta:  No aneurysm identified.  The bifurcation is obscured by overlying bowel gas artifact.  IMPRESSION: Multiple echogenic foci within the gallbladder, may represent tumefactive sludge or nonshadowing stones.  There is gallbladder wall thickening and positive sonographic Murphy's sign, raising concern for acute cholecystitis.  Correlate with clinical presentation and lab values.  If clinically warranted, a HIDA scan could be performed.  Original Report Authenticated By: Waneta Martins, M.D.     Date: 11/08/2011  Rate: 67  Rhythm: normal sinus rhythm and sinus arrhythmia  QRS Axis: normal  Intervals: normal  ST/T Wave abnormalities: normal  Conduction Disutrbances:none  Narrative Interpretation:   Old EKG Reviewed: none available  1. Cholelithiases       MDM  41 year old female with right upper quadrant pain with what sounds to be referred pain in her chest and back. Suspect gallbladder disease. Will get labs, right upper quadrant ultrasound. Patient with wheezing on exam history of COPD we'll also check chest x-ray     3:57 AM Discussed case with Dr. Carolynne Edouard on call for surgery given ultrasound findings. He will see the patient in consult  Olivia Mackie, MD 11/08/11 0615  6:42 AM Pt seen by surgery, offered admission and surgery for her cholelithiasis.  Pt refusing, will leave AMA.  Will f/u outpatient with general surgery.  Olivia Mackie, MD 11/08/11 [phone]

## 2011-11-08 NOTE — ED Notes (Signed)
Patient transported to X-ray 

## 2011-11-08 NOTE — Consult Note (Signed)
Reason for Consult:abd pain Referring Physician: Dr. Jill Pierce is an 41 y.o. female.  HPI: 41 yo bf presents with ruq abd pain that she has been experiencing off and on for the past 2-3 weeks. She has had nausea but no vomiting. She has had intermittent sweats since she was 41 years old. U/Pierce showed stones and sludge in gallbladder and some wall thickening. She feels better now and does not want to stay to have surgery  History reviewed. No pertinent past medical history.  History reviewed. No pertinent past surgical history.  No family history on file.  Social History:  reports that she has been smoking.  She does not have any smokeless tobacco history on file. She reports that she drinks alcohol. Her drug history not on file.  Allergies:  Allergies  Allergen Reactions  . Amoxicillin Other (See Comments)    unknown    Medications: I have reviewed the patient'Pierce current medications.  Results for orders placed during the hospital encounter of 11/08/11 (from the past 48 hour(Pierce))  DIFFERENTIAL     Status: Abnormal   Collection Time   11/08/11 12:58 AM      Component Value Range Comment   Neutrophils Relative 43  43 - 77 (%)    Neutro Abs 3.1  1.7 - 7.7 (K/uL)    Lymphocytes Relative 47 (*) 12 - 46 (%)    Lymphs Abs 3.4  0.7 - 4.0 (K/uL)    Monocytes Relative 9  3 - 12 (%)    Monocytes Absolute 0.6  0.1 - 1.0 (K/uL)    Eosinophils Relative 1  0 - 5 (%)    Eosinophils Absolute 0.1  0.0 - 0.7 (K/uL)    Basophils Relative 0  0 - 1 (%)    Basophils Absolute 0.0  0.0 - 0.1 (K/uL)   COMPREHENSIVE METABOLIC PANEL     Status: Abnormal   Collection Time   11/08/11 12:58 AM      Component Value Range Comment   Sodium 136  135 - 145 (mEq/L)    Potassium 3.1 (*) 3.5 - 5.1 (mEq/L)    Chloride 101  96 - 112 (mEq/L)    CO2 24  19 - 32 (mEq/L)    Glucose, Bld 117 (*) 70 - 99 (mg/dL)    BUN 8  6 - 23 (mg/dL)    Creatinine, Ser 1.61  0.50 - 1.10 (mg/dL)    Calcium 9.6  8.4 - 10.5  (mg/dL)    Total Protein 7.0  6.0 - 8.3 (g/dL)    Albumin 3.9  3.5 - 5.2 (g/dL)    AST 15  0 - 37 (U/L)    ALT 8  0 - 35 (U/L)    Alkaline Phosphatase 89  39 - 117 (U/L)    Total Bilirubin 0.3  0.3 - 1.2 (mg/dL)    GFR calc non Af Amer 87 (*) >90 (mL/min)    GFR calc Af Amer >90  >90 (mL/min)   CK TOTAL AND CKMB     Status: Abnormal   Collection Time   11/08/11 12:58 AM      Component Value Range Comment   Total CK 191 (*) 7 - 177 (U/L)    CK, MB 2.0  0.3 - 4.0 (ng/mL)    Relative Index 1.0  0.0 - 2.5    TROPONIN I     Status: Normal   Collection Time   11/08/11 12:58 AM      Component  Value Range Comment   Troponin I <0.30  <0.30 (ng/mL)   CBC     Status: Normal   Collection Time   11/08/11 12:58 AM      Component Value Range Comment   WBC 7.2  4.0 - 10.5 (K/uL)    RBC 4.43  3.87 - 5.11 (MIL/uL)    Hemoglobin 13.7  12.0 - 15.0 (g/dL)    HCT 16.1  09.6 - 04.5 (%)    MCV 87.1  78.0 - 100.0 (fL)    MCH 30.9  26.0 - 34.0 (pg)    MCHC 35.5  30.0 - 36.0 (g/dL)    RDW 40.9  81.1 - 91.4 (%)    Platelets 190  150 - 400 (K/uL)   LIPASE, BLOOD     Status: Normal   Collection Time   11/08/11 12:58 AM      Component Value Range Comment   Lipase 27  11 - 59 (U/L)     Dg Chest 2 View  11/08/2011  *RADIOLOGY REPORT*  Clinical Data: Chest pain  CHEST - 2 VIEW  Comparison: 04/17/2011  Findings: Lungs are clear.  Cardiomediastinal contours within normal limits.  No pleural effusion or pneumothorax.  No acute osseous abnormality.  IMPRESSION: No acute process identified.  Original Report Authenticated By: Waneta Martins, M.D.   US Abdomen Complete  11/08/2011  *RADIOLOGY REPORT*  Clinical Data:  Chest pain, nausea, vomiting.  COMPLETE ABDOMINAL ULTRASOUND  Comparison:  None.  Findings:  Gallbladder:  Multiple echogenic foci without shadowing.  There is mild gallbladder wall thickening at 4 mm.  No pericholecystic fluid identified.  Per the sonographer, positive sonographic Murphy'Pierce sign.   Common bile duct:  Measures 2-3 mm, within normal limits.  Liver:  There is a 1 cm echogenic focus within the left hepatic lobe.  There appears to be increased through transmission. Otherwise, the liver demonstrates homogeneous echogenicity.  IVC:  Appears normal.  Pancreas:  No focal abnormality seen.  Spleen:  Within normal limits, measuring 5 cm.  Right Kidney:  Measures 11.2 cm.  No hydronephrosis or focal lesion.  Left Kidney:  Measures 10.3 cm.  No hydronephrosis or focal lesion.  Abdominal aorta:  No aneurysm identified.  The bifurcation is obscured by overlying bowel gas artifact.  IMPRESSION: Multiple echogenic foci within the gallbladder, may represent tumefactive sludge or nonshadowing stones.  There is gallbladder wall thickening and positive sonographic Murphy'Pierce sign, raising concern for acute cholecystitis.  Correlate with clinical presentation and lab values.  If clinically warranted, a HIDA scan could be performed.  Original Report Authenticated By: Waneta Martins, M.D.    Review of Systems  Constitutional: Positive for diaphoresis.  HENT: Negative.   Eyes: Negative.   Respiratory: Negative.   Cardiovascular: Negative.   Gastrointestinal: Positive for nausea and abdominal pain.  Genitourinary: Negative.   Musculoskeletal: Negative.   Skin: Negative.   Neurological: Negative.   Endo/Heme/Allergies: Negative.   Psychiatric/Behavioral: Negative.    Blood pressure 120/84, pulse 73, temperature 97.8 F (36.6 C), temperature source Oral, resp. rate 22, SpO2 99.00%. Physical Exam  Constitutional: She is oriented to person, place, and time. She appears well-developed and well-nourished.  HENT:  Head: Normocephalic and atraumatic.  Eyes: Conjunctivae and EOM are normal. Pupils are equal, round, and reactive to light.  Neck: Normal range of motion. Neck supple.  Cardiovascular: Normal rate, regular rhythm and normal heart sounds.   Respiratory: Effort normal and breath sounds  normal.  GI: Soft. Bowel sounds  are normal.       Mild tenderness RUQ. No guarding or mass  Musculoskeletal: Normal range of motion.  Neurological: She is alert and oriented to person, place, and time.  Skin: Skin is warm and dry.  Psychiatric: She has a normal mood and affect. Her behavior is normal.    Assessment/Plan: Possible cholecystitis with cholelithiasis. LFT'Pierce are normal. WBC normal. I have offered her to stay and have surgery to remove gallbladder. She will not stay so we will try to arrange this as an outpatient. I have discussed with her the risks and benefits of surgery including some of the technical aspects. All her questions were answered. She agrees to call if her symptoms worsen. She may need urine drug screen prior to surgery.  Stacy Pierce,Stacy Pierce 11/08/2011, 6:36 AM

## 2011-11-08 NOTE — ED Notes (Signed)
Patient transported to Ultrasound 

## 2011-11-08 NOTE — ED Notes (Signed)
The pt says she has had mid-chest pain for 3 weeks .  Worse today with nausea and sob

## 2011-11-09 ENCOUNTER — Inpatient Hospital Stay (HOSPITAL_COMMUNITY)
Admission: EM | Admit: 2011-11-09 | Discharge: 2011-11-10 | DRG: 419 | Disposition: A | Discharge status: Home / Self Care | Payer: Medicaid Other | Attending: General Surgery | Admitting: General Surgery

## 2011-11-09 ENCOUNTER — Emergency Department (HOSPITAL_COMMUNITY): Payer: Medicaid Other

## 2011-11-09 ENCOUNTER — Encounter (HOSPITAL_COMMUNITY): Admission: EM | Disposition: A | Discharge status: Home / Self Care | Payer: Self-pay | Source: Home / Self Care

## 2011-11-09 ENCOUNTER — Inpatient Hospital Stay (HOSPITAL_COMMUNITY): Payer: Medicaid Other

## 2011-11-09 ENCOUNTER — Emergency Department (HOSPITAL_COMMUNITY): Payer: Medicaid Other | Admitting: Anesthesiology

## 2011-11-09 ENCOUNTER — Encounter (HOSPITAL_COMMUNITY): Payer: Self-pay | Admitting: *Deleted

## 2011-11-09 ENCOUNTER — Encounter (HOSPITAL_COMMUNITY): Payer: Self-pay | Admitting: Anesthesiology

## 2011-11-09 DIAGNOSIS — K8000 Calculus of gallbladder with acute cholecystitis without obstruction: Secondary | ICD-10-CM

## 2011-11-09 DIAGNOSIS — K819 Cholecystitis, unspecified: Secondary | ICD-10-CM

## 2011-11-09 DIAGNOSIS — K810 Acute cholecystitis: Secondary | ICD-10-CM | POA: Diagnosis present

## 2011-11-09 HISTORY — DX: Depression, unspecified: F32.A

## 2011-11-09 HISTORY — PX: CHOLECYSTECTOMY: SHX55

## 2011-11-09 HISTORY — DX: Anxiety disorder, unspecified: F41.9

## 2011-11-09 HISTORY — DX: Calculus of gallbladder without cholecystitis without obstruction: K80.20

## 2011-11-09 HISTORY — DX: Major depressive disorder, single episode, unspecified: F32.9

## 2011-11-09 LAB — CBC
Hemoglobin: 14 g/dL (ref 12.0–15.0)
MCH: 30.6 pg (ref 26.0–34.0)
MCHC: 34.6 g/dL (ref 30.0–36.0)
Platelets: 193 10*3/uL (ref 150–400)
RBC: 4.57 MIL/uL (ref 3.87–5.11)

## 2011-11-09 LAB — URINE MICROSCOPIC-ADD ON

## 2011-11-09 LAB — COMPREHENSIVE METABOLIC PANEL
ALT: 9 U/L (ref 0–35)
AST: 12 U/L (ref 0–37)
Alkaline Phosphatase: 98 U/L (ref 39–117)
CO2: 23 mEq/L (ref 19–32)
Calcium: 9.4 mg/dL (ref 8.4–10.5)
Glucose, Bld: 132 mg/dL — ABNORMAL HIGH (ref 70–99)
Potassium: 3.2 mEq/L — ABNORMAL LOW (ref 3.5–5.1)
Sodium: 135 mEq/L (ref 135–145)
Total Protein: 7.1 g/dL (ref 6.0–8.3)

## 2011-11-09 LAB — URINALYSIS, ROUTINE W REFLEX MICROSCOPIC
Glucose, UA: NEGATIVE mg/dL
Leukocytes, UA: NEGATIVE
Specific Gravity, Urine: 1.015 (ref 1.005–1.030)
pH: 6 (ref 5.0–8.0)

## 2011-11-09 SURGERY — LAPAROSCOPIC CHOLECYSTECTOMY
Anesthesia: General | Wound class: Clean Contaminated

## 2011-11-09 MED ORDER — SUCCINYLCHOLINE CHLORIDE 20 MG/ML IJ SOLN
INTRAMUSCULAR | Status: DC | PRN
  Administered 2011-11-09: 100 mg via INTRAVENOUS

## 2011-11-09 MED ORDER — KETOROLAC TROMETHAMINE 30 MG/ML IJ SOLN
INTRAMUSCULAR | Status: AC
  Filled 2011-11-09: qty 1

## 2011-11-09 MED ORDER — NICOTINE 14 MG/24HR TD PT24
14.0000 mg | MEDICATED_PATCH | Freq: Every day | TRANSDERMAL | Status: DC
  Administered 2011-11-09 – 2011-11-10 (×3): 14 mg via TRANSDERMAL
  Filled 2011-11-09 (×2): qty 1

## 2011-11-09 MED ORDER — KETOROLAC TROMETHAMINE 30 MG/ML IJ SOLN: 15.0000 mg | Freq: Once | INTRAMUSCULAR | Status: DC | PRN

## 2011-11-09 MED ORDER — IOHEXOL 300 MG/ML  SOLN
INTRAMUSCULAR | Status: DC | PRN
  Administered 2011-11-09: 8 mL via INTRAVENOUS

## 2011-11-09 MED ORDER — HYDROMORPHONE HCL PF 1 MG/ML IJ SOLN
0.2500 mg | INTRAMUSCULAR | Status: DC | PRN
  Administered 2011-11-09: 0.5 mg via INTRAVENOUS
  Administered 2011-11-09: 0.25 mg via INTRAVENOUS
  Administered 2011-11-09: 0.5 mg via INTRAVENOUS
  Administered 2011-11-09: 0.25 mg via INTRAVENOUS

## 2011-11-09 MED ORDER — ACETAMINOPHEN 10 MG/ML IV SOLN: 1000.0000 mg | Freq: Once | INTRAVENOUS | Status: DC | PRN

## 2011-11-09 MED ORDER — SODIUM CHLORIDE 0.9 % IV BOLUS (SEPSIS)
1000.0000 mL | Freq: Once | INTRAVENOUS | Status: AC
  Administered 2011-11-09: 1000 mL via INTRAVENOUS

## 2011-11-09 MED ORDER — ALBUTEROL SULFATE (5 MG/ML) 0.5% IN NEBU
5.0000 mg | INHALATION_SOLUTION | Freq: Once | RESPIRATORY_TRACT | Status: AC
  Administered 2011-11-09: 5 mg via RESPIRATORY_TRACT
  Filled 2011-11-09: qty 1

## 2011-11-09 MED ORDER — HYDROCODONE-ACETAMINOPHEN 5-325 MG PO TABS
1.0000 | ORAL_TABLET | ORAL | Status: DC | PRN
  Administered 2011-11-09 – 2011-11-10 (×4): 2 via ORAL
  Filled 2011-11-09 (×4): qty 2

## 2011-11-09 MED ORDER — ENOXAPARIN SODIUM 40 MG/0.4ML ~~LOC~~ SOLN
40.0000 mg | SUBCUTANEOUS | Status: DC
  Filled 2011-11-09 (×2): qty 0.4

## 2011-11-09 MED ORDER — CIPROFLOXACIN IN D5W 400 MG/200ML IV SOLN
400.0000 mg | Freq: Two times a day (BID) | INTRAVENOUS | Status: DC
  Administered 2011-11-09 – 2011-11-10 (×2): 400 mg via INTRAVENOUS
  Filled 2011-11-09 (×3): qty 200

## 2011-11-09 MED ORDER — PROPOFOL 10 MG/ML IV EMUL
INTRAVENOUS | Status: DC | PRN
  Administered 2011-11-09: 140 mg via INTRAVENOUS

## 2011-11-09 MED ORDER — ONDANSETRON HCL 4 MG/2ML IJ SOLN
4.0000 mg | Freq: Once | INTRAMUSCULAR | Status: AC
  Administered 2011-11-09: 4 mg via INTRAVENOUS
  Filled 2011-11-09: qty 2

## 2011-11-09 MED ORDER — ACETAMINOPHEN 10 MG/ML IV SOLN
1000.0000 mg | Freq: Four times a day (QID) | INTRAVENOUS | Status: DC
  Administered 2011-11-09: 1000 mg via INTRAVENOUS

## 2011-11-09 MED ORDER — KCL IN DEXTROSE-NACL 20-5-0.45 MEQ/L-%-% IV SOLN
INTRAVENOUS | Status: DC
  Administered 2011-11-09 – 2011-11-10 (×2): via INTRAVENOUS
  Filled 2011-11-09: qty 1000

## 2011-11-09 MED ORDER — LACTATED RINGERS IV SOLN
INTRAVENOUS | Status: DC | PRN
  Administered 2011-11-09 (×2): via INTRAVENOUS

## 2011-11-09 MED ORDER — LIDOCAINE-EPINEPHRINE 1 %-1:100000 IJ SOLN
INTRAMUSCULAR | Status: DC | PRN
  Administered 2011-11-09: 10 mL

## 2011-11-09 MED ORDER — HYDROMORPHONE HCL PF 1 MG/ML IJ SOLN
INTRAMUSCULAR | Status: AC
  Filled 2011-11-09: qty 1

## 2011-11-09 MED ORDER — BUPIVACAINE HCL (PF) 0.25 % IJ SOLN
INTRAMUSCULAR | Status: DC | PRN
  Administered 2011-11-09: 10 mL

## 2011-11-09 MED ORDER — ONDANSETRON HCL 4 MG/2ML IJ SOLN
INTRAMUSCULAR | Status: DC | PRN
  Administered 2011-11-09 (×2): 4 mg via INTRAVENOUS

## 2011-11-09 MED ORDER — NEOSTIGMINE METHYLSULFATE 1 MG/ML IJ SOLN
INTRAMUSCULAR | Status: DC | PRN
  Administered 2011-11-09: 3 mg via INTRAVENOUS

## 2011-11-09 MED ORDER — ONDANSETRON HCL 4 MG/2ML IJ SOLN: 4.0000 mg | Freq: Once | INTRAMUSCULAR | Status: DC | PRN

## 2011-11-09 MED ORDER — HYDROMORPHONE HCL PF 1 MG/ML IJ SOLN
0.5000 mg | Freq: Once | INTRAMUSCULAR | Status: AC
  Administered 2011-11-09: 0.5 mg via INTRAVENOUS
  Filled 2011-11-09: qty 1

## 2011-11-09 MED ORDER — MORPHINE SULFATE 4 MG/ML IJ SOLN
4.0000 mg | Freq: Once | INTRAMUSCULAR | Status: DC
  Filled 2011-11-09: qty 1

## 2011-11-09 MED ORDER — ONDANSETRON HCL 4 MG/2ML IJ SOLN
4.0000 mg | INTRAMUSCULAR | Status: DC | PRN
  Administered 2011-11-09: 4 mg via INTRAVENOUS
  Filled 2011-11-09: qty 2

## 2011-11-09 MED ORDER — KETOROLAC TROMETHAMINE 30 MG/ML IJ SOLN
30.0000 mg | Freq: Once | INTRAMUSCULAR | Status: AC
  Administered 2011-11-09: 30 mg via INTRAVENOUS

## 2011-11-09 MED ORDER — ROCURONIUM BROMIDE 100 MG/10ML IV SOLN
INTRAVENOUS | Status: DC | PRN
  Administered 2011-11-09: 20 mg via INTRAVENOUS

## 2011-11-09 MED ORDER — SODIUM CHLORIDE 0.9 % IR SOLN
Status: DC | PRN
  Administered 2011-11-09: 1000 mL

## 2011-11-09 MED ORDER — CIPROFLOXACIN IN D5W 400 MG/200ML IV SOLN
400.0000 mg | INTRAVENOUS | Status: AC
  Administered 2011-11-09: 400 mg via INTRAVENOUS
  Filled 2011-11-09: qty 200

## 2011-11-09 MED ORDER — MIDAZOLAM HCL 5 MG/5ML IJ SOLN
INTRAMUSCULAR | Status: DC | PRN
  Administered 2011-11-09: 2 mg via INTRAVENOUS

## 2011-11-09 MED ORDER — POTASSIUM CHLORIDE 2 MEQ/ML IV SOLN
INTRAVENOUS | Status: DC
  Administered 2011-11-09: 16:00:00 via INTRAVENOUS
  Filled 2011-11-09 (×4): qty 1000

## 2011-11-09 MED ORDER — SODIUM CHLORIDE 0.9 % IR SOLN
Status: DC | PRN
  Administered 2011-11-09: 2000 mL

## 2011-11-09 MED ORDER — HYDROMORPHONE HCL PF 1 MG/ML IJ SOLN
1.0000 mg | Freq: Once | INTRAMUSCULAR | Status: AC
  Administered 2011-11-09: 1 mg via INTRAVENOUS
  Filled 2011-11-09: qty 1

## 2011-11-09 MED ORDER — FENTANYL CITRATE 0.05 MG/ML IJ SOLN
INTRAMUSCULAR | Status: DC | PRN
  Administered 2011-11-09: 150 ug via INTRAVENOUS
  Administered 2011-11-09 (×2): 50 ug via INTRAVENOUS

## 2011-11-09 MED ORDER — LACTATED RINGERS IV SOLN
INTRAVENOUS | Status: DC
  Administered 2011-11-09: 11:00:00 via INTRAVENOUS

## 2011-11-09 MED ORDER — GLYCOPYRROLATE 0.2 MG/ML IJ SOLN
INTRAMUSCULAR | Status: DC | PRN
  Administered 2011-11-09: .6 mg via INTRAVENOUS

## 2011-11-09 MED ORDER — LIDOCAINE HCL (CARDIAC) 20 MG/ML IV SOLN
INTRAVENOUS | Status: DC | PRN
  Administered 2011-11-09: 50 mg via INTRAVENOUS

## 2011-11-09 MED ORDER — MORPHINE SULFATE 2 MG/ML IJ SOLN
2.0000 mg | INTRAMUSCULAR | Status: DC | PRN
  Administered 2011-11-10: 2 mg via INTRAVENOUS
  Filled 2011-11-09: qty 1

## 2011-11-09 MED ORDER — ALBUTEROL SULFATE (5 MG/ML) 0.5% IN NEBU
2.5000 mg | INHALATION_SOLUTION | RESPIRATORY_TRACT | Status: DC | PRN
  Administered 2011-11-10: 2.5 mg via RESPIRATORY_TRACT
  Filled 2011-11-09: qty 0.5

## 2011-11-09 MED ORDER — MORPHINE SULFATE 2 MG/ML IJ SOLN: 1.0000 mg | INTRAMUSCULAR | Status: DC | PRN

## 2011-11-09 SURGICAL SUPPLY — 51 items
APPLIER CLIP ROT 10 11.4 M/L (STAPLE) ×2
BLADE SURG ROTATE 9660 (MISCELLANEOUS) IMPLANT
CANISTER SUCTION 2500CC (MISCELLANEOUS) ×2 IMPLANT
CATH REDDICK CHOLANGI 4FR 50CM (CATHETERS) ×2 IMPLANT
CHLORAPREP W/TINT 26ML (MISCELLANEOUS) ×4 IMPLANT
CLIP APPLIE ROT 10 11.4 M/L (STAPLE) ×1 IMPLANT
CLOTH BEACON ORANGE TIMEOUT ST (SAFETY) ×2 IMPLANT
COVER SURGICAL LIGHT HANDLE (MISCELLANEOUS) ×2 IMPLANT
DECANTER SPIKE VIAL GLASS SM (MISCELLANEOUS) IMPLANT
DERMABOND ADVANCED (GAUZE/BANDAGES/DRESSINGS) ×4
DERMABOND ADVANCED .7 DNX12 (GAUZE/BANDAGES/DRESSINGS) ×4 IMPLANT
DRAIN CHANNEL 19F RND (DRAIN) ×2 IMPLANT
DRAPE C-ARM 42X72 X-RAY (DRAPES) ×2 IMPLANT
ELECT CAUTERY BLADE 6.4 (BLADE) ×2 IMPLANT
ELECT REM PT RETURN 9FT ADLT (ELECTROSURGICAL) ×2
ELECTRODE REM PT RTRN 9FT ADLT (ELECTROSURGICAL) ×1 IMPLANT
EVACUATOR SILICONE 100CC (DRAIN) ×2 IMPLANT
GLOVE BIO SURGEON STRL SZ 6.5 (GLOVE) ×2 IMPLANT
GLOVE BIO SURGEON STRL SZ7 (GLOVE) ×2 IMPLANT
GLOVE BIOGEL PI IND STRL 7.0 (GLOVE) ×2 IMPLANT
GLOVE BIOGEL PI IND STRL 7.5 (GLOVE) ×1 IMPLANT
GLOVE BIOGEL PI INDICATOR 7.0 (GLOVE) ×2
GLOVE BIOGEL PI INDICATOR 7.5 (GLOVE) ×1
GLOVE SURG SS PI 7.5 STRL IVOR (GLOVE) ×4 IMPLANT
GOWN PREVENTION PLUS XLARGE (GOWN DISPOSABLE) ×2 IMPLANT
GOWN STRL NON-REIN LRG LVL3 (GOWN DISPOSABLE) ×8 IMPLANT
IV CATH 14GX2 1/4 (CATHETERS) ×2 IMPLANT
KIT BASIN OR (CUSTOM PROCEDURE TRAY) ×2 IMPLANT
KIT ROOM TURNOVER OR (KITS) ×2 IMPLANT
NS IRRIG 1000ML POUR BTL (IV SOLUTION) ×2 IMPLANT
PAD ARMBOARD 7.5X6 YLW CONV (MISCELLANEOUS) ×4 IMPLANT
PENCIL BUTTON HOLSTER BLD 10FT (ELECTRODE) ×2 IMPLANT
POUCH SPECIMEN RETRIEVAL 10MM (ENDOMECHANICALS) ×2 IMPLANT
SCISSORS LAP 5X35 DISP (ENDOMECHANICALS) IMPLANT
SET IRRIG TUBING LAPAROSCOPIC (IRRIGATION / IRRIGATOR) ×2 IMPLANT
SLEEVE ENDOPATH XCEL 5M (ENDOMECHANICALS) ×2 IMPLANT
SPECIMEN JAR SMALL (MISCELLANEOUS) ×2 IMPLANT
SPONGE GAUZE 4X4 12PLY (GAUZE/BANDAGES/DRESSINGS) ×2 IMPLANT
SUT ETHILON 2 0 FS 18 (SUTURE) ×2 IMPLANT
SUT MNCRL AB 4-0 PS2 18 (SUTURE) ×4 IMPLANT
SUT VICRYL 0 UR6 27IN ABS (SUTURE) ×6 IMPLANT
TAPE CLOTH SURG 4X10 WHT LF (GAUZE/BANDAGES/DRESSINGS) ×2 IMPLANT
TOWEL OR 17X24 6PK STRL BLUE (TOWEL DISPOSABLE) ×2 IMPLANT
TOWEL OR 17X26 10 PK STRL BLUE (TOWEL DISPOSABLE) ×2 IMPLANT
TRAY FOLEY CATH 14FR (SET/KITS/TRAYS/PACK) ×2 IMPLANT
TRAY LAPAROSCOPIC (CUSTOM PROCEDURE TRAY) ×2 IMPLANT
TROCAR BALLN 12MMX100 BLUNT (TROCAR) ×2 IMPLANT
TROCAR HASSON GELL 12X100 (TROCAR) ×2 IMPLANT
TROCAR XCEL NON-BLD 11X100MML (ENDOMECHANICALS) ×2 IMPLANT
TROCAR XCEL NON-BLD 5MMX100MML (ENDOMECHANICALS) ×2 IMPLANT
WATER STERILE IRR 1000ML POUR (IV SOLUTION) IMPLANT

## 2011-11-09 NOTE — Brief Op Note (Signed)
11/09/2011  2:17 PM  PATIENT:  Stacy Pierce  41 y.o. female  PRE-OPERATIVE DIAGNOSIS:  Gallstones  POST-OPERATIVE DIAGNOSIS:  Gall Stones  PROCEDURE:  Procedure(s) (LRB): LAPAROSCOPIC CHOLECYSTECTOMY (N/A)  SURGEON:  Surgeon(s) and Role:    * Lodema Pilot, DO - Primary  PHYSICIAN ASSISTANT:   ASSISTANTS: none   ANESTHESIA:   general  EBL:  Total I/O In: 2400 [I.V.:2400] Out: 250 [Urine:250]  BLOOD ADMINISTERED:none  DRAINS: 66F blake in GB fossa   LOCAL MEDICATIONS USED:  MARCAINE    and LIDOCAINE   SPECIMEN:  Source of Specimen:  gallbladder  DISPOSITION OF SPECIMEN:  PATHOLOGY  COUNTS:  YES  TOURNIQUET:  * No tourniquets in log *  DICTATION: .Other Dictation: Dictation Number [id number]  PLAN OF CARE: Admit to inpatient   PATIENT DISPOSITION:  PACU - hemodynamically stable.   Delay start of Pharmacological VTE agent (>24hrs) due to surgical blood loss or risk of bleeding: no

## 2011-11-09 NOTE — Op Note (Signed)
NAMEMarland Kitchen  TAYLEY, MUDRICK NO.:  [id number]  MEDICAL RECORD NO.:  [id number]  LOCATION:  5124                         FACILITY:  [facility]  PHYSICIAN:  Lodema Pilot, MD       DATE OF BIRTH:  1971-05-28  DATE OF PROCEDURE:  11/09/2011 DATE OF DISCHARGE:                              OPERATIVE REPORT   PROCEDURE:  Laparoscopic cholecystectomy with intraoperative cholangiogram.  PREOPERATIVE DIAGNOSIS:  Acute cholecystitis.  POSTOP DIAGNOSIS:  Acute cholecystitis.  SURGEON:  Lodema Pilot, MD  ASSISTANT:  None.  ANESTHESIA:  General endotracheal anesthesia with 26 mL of 1% lidocaine with epinephrine and 0.25% Marcaine in a 50:50 mixture.  FLUIDS:  1400 mL crystalloid.  ESTIMATED BLOOD LOSS:  Minimal.  DRAINS:  An 19-French Blake drain in the gallbladder fossa and exited through the right lateral abdominal trocar sites.  SPECIMENS:  No contents.  COMPLICATIONS:  None apparent.  FINDINGS:  Very edematous and thickened gallbladder but normal cholangiogram with normal appearing anatomy.  INDICATION FOR PROCEDURE:  Ms. Mccown is a 41 year old female with 3- week history of right upper quadrant pain which was unresponsive at terms.  She had gallbladder wall thickening and concern for acute cholecystitis.  She was seen yesterday and offered cholecystectomy.  She went home to deal with some family issues and came back today with persistent pain.  OPERATIVE DETAILS:  Ms. Garza was seen and evaluated in preoperative area, and the risks and benefits of the procedure were again discussed in lay terms.  Informed consent was obtained.  She was on therapeutic antibiotics from the emergency room, and she has taken operating room, placed on table in supine position and general endotracheal anesthesia obtained.  Foley catheter was placed and her abdomen was prepped and draped in a standard surgical fashion.  A supraumbilical midline incision was made in the skin and  dissection carried down through subcutaneous tissue using blunt dissection.  Fascia was elevated and sharply incised and peritoneum entered directly.  An 2 Vicryl sutures were placed and a 12 mm Hasson port was placed, and the pneumoperitoneum was obtained and laparoscope was introduced.  There was no evidence of bowel injury upon entry.  Gallbladder was thickened with some ascitic type fluid in the abdomen.  An 11 mm epigastric trocar was placed under direct visualization and two 5 mm right lateral abdominal trocars were placed under direct visualization.  The gallbladder was retracted with cephalad.  Gallbladder was all with Green and very edematous and thickened and consistent with acute cholecystitis.  Unfortunately, because the edema and some of the dissection of the around the triangle of CLO was actually fairly easy.  Peritoneum was taken down using blunt dissection, and the cystic artery was easily identified in its usual anatomic position on the medial aspect of the gallbladder.  Clips were placed on the on the cystic artery, but it was not divided at this time. Cystic duct was skeletonized and a critical view of safety was obtained visualizing a single cystic duct entering the gallbladder with the liver parenchyma through the triangle of CLO with a single cystic artery coursing up onto the gallbladder.  The duct was skeletonized, a clip  was placed on the gallbladder side and cholangiogram catheter, placed through the abdominal wall through a 14-gauge angiocatheter and a small cystic ductotomy was made and a cholangiogram was performed which demonstrated a normal common bile duct with free flow of bile into the duodenum, normal right and left hepatic ducts, and no evidence of common bile duct stones.  The catheter was removed and the duct was clipped with 3 clips on the stay side, and cystic duct was transected.  The artery which was already clipped and was divided as well.   The gallbladder was removed from the gallbladder fossa using Bovie electrocautery.  Some of this was just done bluntly as it was so edematous and with it the Bovie was not very effective.  The gallbladder was then completely removed from the gallbladder fossa and placed in EndoCatch bag.  Prior to removing the gallbladder the clips were noted to be inspected and noted to be in good position.  Gallbladder fossa was hemostatic without any evidence of bleeding.  No evidence of bile and no evidence of bowel injury.  The gallbladder was removed from the umbilical fashion, although the incision and fascial incision had to be enlarged slightly to accommodate the removal.  Gallbladder was removed and passed off the table, sent to pathology for permanent sectioning. The Hasson trocar was placed back in the abdomen and the right upper quadrant was irrigated with 2 L of irrigation till the irrigation returned clear.  The clips appeared to be in good position.  The gallbladder fossa was noted be hemostatic.  The 19-French Harrison Mons drain was placed in the gallbladder fossa and exited through the right lateral abdominal wall.  It was sutured and placed with 2-0 nylon drain stitch. The right upper quadrant trocar was removed under direct visualization and the umbilical fascia was approximated with multiple 0 Vicryl sutures in open fashion, they were secured and the abdomen was reinsufflated through the epigastric trocar, and a right upper quadrant was hemostatic without any evidence of bleeding or bile or bowel injury.  The umbilical fascia closure was noted be adequate without any evidence of bowel injury and the gas was removed and the epigastric trocar was removed. She was very thin at the anterior fascia and the epigastric trocar was easily approximated as well with a single 0 Vicryl suture and the skin edges were anesthetized with 20 mL of 1% lidocaine with epinephrine and 0.25% Marcaine in a 50:50  mixture.  The skin edges were approximated with 4-0 Monocryl subcuticular suture.  Skin was washed and dried and Dermabond was applied.  All sponge and instrument counts were correct in the case.  The patient tolerated the procedure well without apparent complication.          ______________________________ Lodema Pilot, MD     BL/MEDQ  D:  11/09/2011  T:  11/09/2011  Job:  [id number]

## 2011-11-09 NOTE — ED Notes (Signed)
Pt belongings given to security to be locked up.

## 2011-11-09 NOTE — ED Notes (Signed)
OR called ready for patient upstairs, paging security to come lock up pt's belongings

## 2011-11-09 NOTE — ED Provider Notes (Signed)
History     CSN: [id number]  Arrival date & time 11/09/11  1308   First MD Initiated Contact with Patient 11/09/11 0622      Chief Complaint  Patient presents with  . Cholelithiasis    (Consider location/radiation/quality/duration/timing/severity/associated sxs/prior treatment) HPI Comments: Patient was seen in the ED yesterday for the same symptoms.  At that time an abdominal ultrasound was done which showed cholelithiasis and concern for cholecystitis.  She was evaluated by Surgery in the ED, but ended up leaving AMA.  She reports that she wanted to eat and was not allowed to eat in the ED and also had to get home to her children.  She reports that she is currently having RUQ pain radiating to her back.  Pain worse with eating.  She is nauseous, but denies vomiting.  She reports that she had a fever of 101F this morning.  She last look ibuprofen 4 hours ago.  She currently smokes and has a history of COPD.  She reports some wheezing and SOB at this time.  Patient is a 41 y.o. female presenting with abdominal pain. The history is provided by the patient and medical records.  Abdominal Pain The primary symptoms of the illness include abdominal pain, fever and nausea. The primary symptoms of the illness do not include shortness of breath, vomiting, diarrhea, hematemesis, hematochezia or dysuria.  The patient states that she believes she is currently not pregnant. The patient has not had a change in bowel habit. Additional symptoms associated with the illness include back pain. Symptoms associated with the illness do not include chills, diaphoresis, constipation, urgency, hematuria or frequency. Significant associated medical issues include gallstones.    Past Medical History  Diagnosis Date  . Gallstones   . Depression   . Anxiety     History reviewed. No pertinent past surgical history.  History reviewed. No pertinent family history.  History  Substance Use Topics  . Smoking  status: Current Everyday Smoker -- 0.5 packs/day  . Smokeless tobacco: Not on file  . Alcohol Use: Yes    OB History    Grav Para Term Preterm Abortions TAB SAB Ect Mult Living                  Review of Systems  Constitutional: Positive for fever. Negative for chills and diaphoresis.  Respiratory: Positive for wheezing. Negative for shortness of breath.   Cardiovascular: Negative for chest pain.  Gastrointestinal: Positive for nausea and abdominal pain. Negative for vomiting, diarrhea, constipation, blood in stool, hematochezia and hematemesis.  Genitourinary: Negative for dysuria, urgency, frequency and hematuria.  Musculoskeletal: Positive for back pain.  Neurological: Negative for dizziness, syncope and light-headedness.    Allergies  Amoxicillin  Home Medications   Current Outpatient Rx  Name Route Sig Dispense Refill  . ONDANSETRON HCL 4 MG PO TABS Oral Take 1 tablet (4 mg total) by mouth every 6 (six) hours. 12 tablet 0  . OXYCODONE-ACETAMINOPHEN 5-325 MG PO TABS Oral Take 2 tablets by mouth every 4 (four) hours as needed for pain. 20 tablet 0    BP 118/83  Pulse 90  Temp(Src) 98.2 F (36.8 C) (Oral)  Resp 16  SpO2 97%  Physical Exam  Nursing note and vitals reviewed. Constitutional: She appears well-developed and well-nourished. She appears distressed.       Uncomfortable appearing  HENT:  Head: Normocephalic and atraumatic.  Mouth/Throat: Oropharynx is clear and moist.  Cardiovascular: Normal rate, regular rhythm and normal heart sounds.  Pulmonary/Chest: Effort normal. No respiratory distress. She has wheezes.  Abdominal: Soft. Normal appearance and bowel sounds are normal. She exhibits no mass. There is tenderness in the right upper quadrant and epigastric area. There is no rigidity, no rebound, no guarding, no tenderness at McBurney's point and negative Murphy's sign.  Neurological: She is alert.  Skin: Skin is warm and dry. She is not diaphoretic.    Psychiatric: She has a normal mood and affect.    ED Course  Procedures (including critical care time)   Labs Reviewed  CBC  COMPREHENSIVE METABOLIC PANEL  LIPASE, BLOOD  PREGNANCY, URINE  URINALYSIS, ROUTINE W REFLEX MICROSCOPIC   Dg Chest 2 View  11/08/2011  *RADIOLOGY REPORT*  Clinical Data: Chest pain  CHEST - 2 VIEW  Comparison: 04/17/2011  Findings: Lungs are clear.  Cardiomediastinal contours within normal limits.  No pleural effusion or pneumothorax.  No acute osseous abnormality.  IMPRESSION: No acute process identified.  Original Report Authenticated By: Waneta Martins, M.D.   US Abdomen Complete  11/08/2011  *RADIOLOGY REPORT*  Clinical Data:  Chest pain, nausea, vomiting.  COMPLETE ABDOMINAL ULTRASOUND  Comparison:  None.  Findings:  Gallbladder:  Multiple echogenic foci without shadowing.  There is mild gallbladder wall thickening at 4 mm.  No pericholecystic fluid identified.  Per the sonographer, positive sonographic Murphy's sign.  Common bile duct:  Measures 2-3 mm, within normal limits.  Liver:  There is a 1 cm echogenic focus within the left hepatic lobe.  There appears to be increased through transmission. Otherwise, the liver demonstrates homogeneous echogenicity.  IVC:  Appears normal.  Pancreas:  No focal abnormality seen.  Spleen:  Within normal limits, measuring 5 cm.  Right Kidney:  Measures 11.2 cm.  No hydronephrosis or focal lesion.  Left Kidney:  Measures 10.3 cm.  No hydronephrosis or focal lesion.  Abdominal aorta:  No aneurysm identified.  The bifurcation is obscured by overlying bowel gas artifact.  IMPRESSION: Multiple echogenic foci within the gallbladder, may represent tumefactive sludge or nonshadowing stones.  There is gallbladder wall thickening and positive sonographic Murphy's sign, raising concern for acute cholecystitis.  Correlate with clinical presentation and lab values.  If clinically warranted, a HIDA scan could be performed.  Original Report  Authenticated By: Waneta Martins, M.D.     No diagnosis found.  8:08 AM Reassessed patient.  She reports that her nausea has improved.  She is requesting more pain medication.  Will order more pain medication.  She also reports that her SOB has improved.  Dr. Judd Lien discussed case with Surgery.  They report that they will come evaluate patient in the ED.   MDM  Patient returns to the ED with RUQ abdominal pain and nausea.  Abdominal ultrasound done when she was in the ED yesterday showed concern for cholecystitis.  Patient with mild leukocytosis and normal LFT's.  Patient evaluated and admitted by surgery for elective cholecystectomy.          Pascal Lux Bryant, PA-C 11/09/11 1815

## 2011-11-09 NOTE — Anesthesia Postprocedure Evaluation (Signed)
  Anesthesia Post-op Note  Patient: Stacy Pierce  Procedure(s) Performed: Procedure(s) (LRB): LAPAROSCOPIC CHOLECYSTECTOMY (N/A)  Patient Location: PACU  Anesthesia Type: General  Level of Consciousness: awake  Airway and Oxygen Therapy: Patient Spontanous Breathing  Post-op Pain: mild  Post-op Assessment: Post-op Vital signs reviewed, Patient's Cardiovascular Status Stable, Respiratory Function Stable, Patent Airway, No signs of Nausea or vomiting and Pain level controlled  Post-op Vital Signs: stable  Complications: No apparent anesthesia complications

## 2011-11-09 NOTE — H&P (Signed)
Pt seen and evaluated.  She has symptoms c/w symptomatic cholelithiasis and acute cholecystitis but PUD possible as well.  She denies reflux and says that tums does not help her symptoms.  Given focal RUQ tenderness, history, and US findings we have offered her cholecystectomy.  The risks of infection, bleeding, pain, persistent symptoms, scarring, injury to bowel or bile ducts, retained stone, diarrhea, need for additional procedures, and need for open surgery discussed with the patient. I also discussed with her the risks of negative exploration and she expressed understanding and desires to proceed with cholecystectomy.

## 2011-11-09 NOTE — ED Notes (Signed)
Patient was seen in the ED last night and diagnosed with gallstones; patient states that the EDP wanted to admit her to the hospital last night in order to perform surgery to treat her gallstones, but patient had to leave last night in order to pick up her kids.  Patient states that she has returned this morning to have the surgery; patient reporting pain upper and lower abdominal pain that radiates through her back.  Rates pain 10/10 on the numerical pain scale; describes pain as "cramping" and "aching".  Reports nausea; denies vomiting and diarrhea. Patient alert and oriented x4; PERRL present.  Upon arrival to room, patient changed into gown.  Will continue to monitor.

## 2011-11-09 NOTE — H&P (Signed)
Stacy Pierce 1971-05-15 [id number]  Reason for admission: acute cholecystitis BMW:UXLK is a 41 yo bf presents with ruq abd pain that she has been experiencing off and on for the past 2-3 weeks. She has had nausea but no vomiting. She has had intermittent sweats since she was 40 years old. U/S showed stones and sludge in gallbladder and some wall thickening. She says that all this started about 3 months ago after eating a hot pepper.  She says since then she has had intermittent pain, specifically after she eats.  She has had some reflux but Tums doesn't seem to help.  This episode started 3 days ago with pain in the RUQ radiating to her back.  She came to ED yesterday and was evaluated by Dr. Carolynne Edouard, but went home to take care of her kids.  She came back today and we have been asked to evaluate her.   History reviewed. No pertinent past medical history.   History reviewed. No pertinent past surgical history.   No family history on file.   Social History: reports that she has been smoking. She does not have any smokeless tobacco history  on file. She reports that she drinks alcohol. She smokes synthetic marijuana  Allergies:  Allergies   Allergen  Reactions   .  Amoxicillin  Other (See Comments)     unknown    Medications: I have reviewed the patient's current medications.  Results for orders placed during the hospital encounter of 11/08/11 (from the past 48 hour(s))   DIFFERENTIAL Status: Abnormal    Collection Time    11/08/11 12:58 AM   Component  Value  Range  Comment    Neutrophils Relative  43  43 - 77 (%)     Neutro Abs  3.1  1.7 - 7.7 (K/uL)     Lymphocytes Relative  47 (*)  12 - 46 (%)     Lymphs Abs  3.4  0.7 - 4.0 (K/uL)     Monocytes Relative  9  3 - 12 (%)     Monocytes Absolute  0.6  0.1 - 1.0 (K/uL)     Eosinophils Relative  1  0 - 5 (%)     Eosinophils Absolute  0.1  0.0 - 0.7 (K/uL)     Basophils Relative  0  0 - 1 (%)     Basophils Absolute  0.0  0.0 - 0.1 (K/uL)      COMPREHENSIVE METABOLIC PANEL Status: Abnormal    Collection Time    11/08/11 12:58 AM   Component  Value  Range  Comment    Sodium  136  135 - 145 (mEq/L)     Potassium  3.1 (*)  3.5 - 5.1 (mEq/L)     Chloride  101  96 - 112 (mEq/L)     CO2  24  19 - 32 (mEq/L)     Glucose, Bld  117 (*)  70 - 99 (mg/dL)     BUN  8  6 - 23 (mg/dL)     Creatinine, Ser  4.40  0.50 - 1.10 (mg/dL)     Calcium  9.6  8.4 - 10.5 (mg/dL)     Total Protein  7.0  6.0 - 8.3 (g/dL)     Albumin  3.9  3.5 - 5.2 (g/dL)     AST  15  0 - 37 (U/L)     ALT  8  0 - 35 (U/L)     Alkaline Phosphatase  89  39 - 117 (U/L)     Total Bilirubin  0.3  0.3 - 1.2 (mg/dL)     GFR calc non Af Amer  87 (*)  >90 (mL/min)     GFR calc Af Amer  >90  >90 (mL/min)    CK TOTAL AND CKMB Status: Abnormal    Collection Time    11/08/11 12:58 AM   Component  Value  Range  Comment    Total CK  191 (*)  7 - 177 (U/L)     CK, MB  2.0  0.3 - 4.0 (ng/mL)     Relative Index  1.0  0.0 - 2.5    TROPONIN I Status: Normal    Collection Time    11/08/11 12:58 AM   Component  Value  Range  Comment    Troponin I  <0.30  <0.30 (ng/mL)    CBC Status: Normal    Collection Time    11/08/11 12:58 AM   Component  Value  Range  Comment    WBC  7.2  4.0 - 10.5 (K/uL)     RBC  4.43  3.87 - 5.11 (MIL/uL)     Hemoglobin  13.7  12.0 - 15.0 (g/dL)     HCT  29.5  62.1 - 46.0 (%)     MCV  87.1  78.0 - 100.0 (fL)     MCH  30.9  26.0 - 34.0 (pg)     MCHC  35.5  30.0 - 36.0 (g/dL)     RDW  30.8  65.7 - 15.5 (%)     Platelets  190  150 - 400 (K/uL)    LIPASE, BLOOD Status: Normal    Collection Time    11/08/11 12:58 AM   Component  Value  Range  Comment    Lipase  27  11 - 59 (U/L)     Dg Chest 2 View  11/08/2011 *RADIOLOGY REPORT* Clinical Data: Chest pain CHEST - 2 VIEW Comparison: 04/17/2011 Findings: Lungs are clear. Cardiomediastinal contours within normal limits. No pleural effusion or pneumothorax. No acute osseous abnormality. IMPRESSION: No acute  process identified. Original Report Authenticated By: Waneta Martins, M.D.  US Abdomen Complete  11/08/2011 *RADIOLOGY REPORT* Clinical Data: Chest pain, nausea, vomiting. COMPLETE ABDOMINAL ULTRASOUND Comparison: None. Findings: Gallbladder: Multiple echogenic foci without shadowing. There is mild gallbladder wall thickening at 4 mm. No pericholecystic fluid identified. Per the sonographer, positive sonographic Murphy's sign. Common bile duct: Measures 2-3 mm, within normal limits. Liver: There is a 1 cm echogenic focus within the left hepatic lobe. There appears to be increased through transmission. Otherwise, the liver demonstrates homogeneous echogenicity. IVC: Appears normal. Pancreas: No focal abnormality seen. Spleen: Within normal limits, measuring 5 cm. Right Kidney: Measures 11.2 cm. No hydronephrosis or focal lesion. Left Kidney: Measures 10.3 cm. No hydronephrosis or focal lesion. Abdominal aorta: No aneurysm identified. The bifurcation is obscured by overlying bowel gas artifact. IMPRESSION: Multiple echogenic foci within the gallbladder, may represent tumefactive sludge or nonshadowing stones. There is gallbladder wall thickening and positive sonographic Murphy's sign, raising concern for acute cholecystitis. Correlate with clinical presentation and lab values. If clinically warranted, a HIDA scan could be performed. Original Report Authenticated By: Waneta Martins, M.D.   Review of Systems  Constitutional: Positive for diaphoresis.  HENT: Negative.  Eyes: Negative.  Respiratory: Negative.  Cardiovascular: Negative.  Gastrointestinal: Positive for nausea and abdominal pain.  Genitourinary: Negative.  Musculoskeletal: Negative.  Skin: Negative.  Neurological:  Negative.  Endo/Heme/Allergies: Negative.  Psychiatric/Behavioral: Negative.   Filed Vitals:   11/09/11 0719  BP: 116/81  Pulse: 90  Temp:   Resp: 18    Physical Exam  Constitutional: She is oriented to person,  place, and time. She appears well-developed and well-nourished.  HENT:  Head: Normocephalic and atraumatic.  Eyes: Conjunctivae and EOM are normal. Pupils are equal, round, and reactive to light.  Neck: Normal range of motion. Neck supple.  Cardiovascular: Normal rate, regular rhythm.  Normal s1, s2.  No murmurs, gallops, or rubs.  She has palpable radial and pedal pulses bilaterally. Respiratory: CTAB, no wheezes, rhonchi, or rales noted.  Respiratory effort is nonlabored. GI: Soft. Bowel sounds are normal. ND, tenderness RUQ with minimal voluntary guarding.  + murphy's sign.  No masses, organomegaly.  She has a small reducible umbilical hernia  Musculoskeletal: Normal range of motion.  Neurological: She is alert and oriented to person, place, and time.  Skin: Skin is warm and dry.  Psychiatric: She has a normal mood and affect. Her behavior is normal.   Assessment/Plan:  1. Possible cholecystitis with cholelithiasis.   Plan: 1. We will admit the patient and take her to the operating room to undergo a cholecystectomy.  I have d./w the patient laparoscopy vs open cholecystectomy, along with risks and complications, including but not limited to, bleeding, infection, bile leak, abscess, CBD injury, or the need for an ERCP.  I have also discussed potential outcomes with the patient.  She understands and wishes to proceed.

## 2011-11-09 NOTE — ED Notes (Signed)
Pt states she was in ED last night, dx with gallstones and was going to be admitted for surgery but could not stay.  C/o abdominal pain, back pain, and chills

## 2011-11-09 NOTE — Transfer of Care (Signed)
Immediate Anesthesia Transfer of Care Note  Patient: Stacy Pierce  Procedure(s) Performed: Procedure(s) (LRB): LAPAROSCOPIC CHOLECYSTECTOMY (N/A)  Patient Location: PACU  Anesthesia Type: General  Level of Consciousness: awake and alert   Airway & Oxygen Therapy: Patient Spontanous Breathing and Patient connected to nasal cannula oxygen  Post-op Assessment: Report given to PACU RN and Post -op Vital signs reviewed and stable  Post vital signs: Reviewed and stable  Complications: No apparent anesthesia complications

## 2011-11-09 NOTE — Anesthesia Procedure Notes (Signed)
Procedure Name: Intubation Date/Time: 11/09/2011 12:41 PM Performed by: Gwenyth Allegra Pre-anesthesia Checklist: Patient identified, Timeout performed, Emergency Drugs available, Suction available and Patient being monitored Patient Re-evaluated:Patient Re-evaluated prior to inductionOxygen Delivery Method: Circle system utilized Preoxygenation: Pre-oxygenation with 100% oxygen Intubation Type: IV induction, Rapid sequence and Cricoid Pressure applied Laryngoscope Size: Mac and 3 Grade View: Grade I Tube type: Oral Tube size: 7.5 mm Number of attempts: 1 Airway Equipment and Method: Stylet Placement Confirmation: ETT inserted through vocal cords under direct vision,  breath sounds checked- equal and bilateral and positive ETCO2 Secured at: 21 cm Tube secured with: Tape Dental Injury: Teeth and Oropharynx as per pre-operative assessment

## 2011-11-09 NOTE — ED Provider Notes (Signed)
Medical screening examination/treatment/procedure(s) were performed by non-physician practitioner and as supervising physician I was immediately available for consultation/collaboration.  Sunnie Nielsen, MD 11/09/11 2300

## 2011-11-09 NOTE — ED Notes (Signed)
Pt c/o nausea, order obtained from admitting.

## 2011-11-09 NOTE — Anesthesia Preprocedure Evaluation (Signed)
Anesthesia Evaluation  Patient identified by MRN, date of birth, ID band Patient awake    Reviewed: Allergy & Precautions, H&P , NPO status , Patient's Chart, lab work & pertinent test results, reviewed documented beta blocker date and time   Airway Mallampati: II      Dental  (+) Loose   Pulmonary  breath sounds clear to auscultation        Cardiovascular Rhythm:Regular Rate:Normal     Neuro/Psych    GI/Hepatic   Endo/Other    Renal/GU      Musculoskeletal   Abdominal   Peds  Hematology   Anesthesia Other Findings   Reproductive/Obstetrics                           Anesthesia Physical Anesthesia Plan  ASA: II  Anesthesia Plan: General   Post-op Pain Management:    Induction: Intravenous  Airway Management Planned: Oral ETT  Additional Equipment:   Intra-op Plan:   Post-operative Plan: Extubation in OR  Informed Consent: I have reviewed the patients History and Physical, chart, labs and discussed the procedure including the risks, benefits and alternatives for the proposed anesthesia with the patient or authorized representative who has indicated his/her understanding and acceptance.   Dental advisory given  Plan Discussed with:   Anesthesia Plan Comments: (Smoker/COPD Acute Cholecystitis  Plan GA)        Anesthesia Quick Evaluation

## 2011-11-10 ENCOUNTER — Encounter (HOSPITAL_COMMUNITY): Payer: Self-pay | Admitting: General Surgery

## 2011-11-10 LAB — CBC
HCT: 32.5 % — ABNORMAL LOW (ref 36.0–46.0)
Hemoglobin: 11.3 g/dL — ABNORMAL LOW (ref 12.0–15.0)
MCHC: 34.8 g/dL (ref 30.0–36.0)
RBC: 3.75 MIL/uL — ABNORMAL LOW (ref 3.87–5.11)

## 2011-11-10 LAB — COMPREHENSIVE METABOLIC PANEL
ALT: 19 U/L (ref 0–35)
AST: 28 U/L (ref 0–37)
Calcium: 8.5 mg/dL (ref 8.4–10.5)
Creatinine, Ser: 0.7 mg/dL (ref 0.50–1.10)
GFR calc Af Amer: >90 mL/min (ref >90)
Glucose, Bld: 104 mg/dL — ABNORMAL HIGH (ref 70–99)
Sodium: 138 mEq/L (ref 135–145)
Total Protein: 5.6 g/dL — ABNORMAL LOW (ref 6.0–8.3)

## 2011-11-10 MED ORDER — HYDROCODONE-ACETAMINOPHEN 5-325 MG PO TABS: 1.0000 | ORAL_TABLET | ORAL | Status: AC | PRN

## 2011-11-10 MED ORDER — SIMETHICONE 40 MG/0.6ML PO SUSP
40.0000 mg | Freq: Four times a day (QID) | ORAL | Status: DC | PRN
  Administered 2011-11-10: 40 mg via ORAL
  Filled 2011-11-10: qty 0.6

## 2011-11-10 MED FILL — Hydromorphone HCl Inj 1 MG/ML: INTRAMUSCULAR | Qty: 1 | Status: AC

## 2011-11-10 NOTE — Discharge Summary (Signed)
Patient ID: Stacy Pierce MRN: [id number] DOB/AGE: 01/25/1971 41 y.o.  Admit date: 11/09/2011 Discharge date: 11/10/2011  Procedures: laparoscopic cholecystectomy with 19 F blake drain placement  Consults: None  Reason for Admission: This is a 41 yo female who presented to the [facility] with RUQ abdominal pain and findings concerning for cholecystitis on her abd u/s.  Admission Diagnoses:  1. Acute cholecystitis  Hospital Course: The patient was admitted and taken to the OR where she underwent a lap chole.  She had a 32 F blake drain placed.  The patient tolerated the procedure well.  Her diet was advanced as tolerated postoperatively.  She did have some gas pains and was given simethicone for this.  Her drain remained serous at time of discharge.  Her pain was well controlled and she was felt stable for discharge home on POD# 1.  PE: Abd: soft, appropriately tender, JP with serous output, +BS, ND  Discharge Diagnoses:  Principal Problem:  *Acute cholecystitis s/p lap chole  Discharge Medications: Medication List  As of 11/10/2011  9:16 AM   STOP taking these medications         oxyCODONE-acetaminophen 5-325 MG per tablet         TAKE these medications         HYDROcodone-acetaminophen 5-325 MG per tablet   Commonly known as: NORCO   Take 1-2 tablets by mouth every 4 (four) hours as needed.      ondansetron 4 MG tablet   Commonly known as: ZOFRAN   Take 1 tablet (4 mg total) by mouth every 6 (six) hours.            Discharge Instructions: Follow-up Information    Follow up with Tracye Szuch DAVID, DO. Schedule an appointment as soon as possible for a visit in 3 weeks.   Contact information:   [address] [place] [address] [phone]       Follow up with Maryan Puls, CMA on 11/13/2011. (10:00am for drain removal)    Contact information:   [address] [place] [address]           Signed: Letha Cape 11/10/2011, 9:16 AM  She feels better.  No evidence of postop complication.  She should be okay for discharge today and f/u later this week in clinic for drain removal.

## 2011-11-10 NOTE — Progress Notes (Signed)
DC home with husband. Verbally understood jp drain care and dc instructions. Supplies sent home from measuring jp drainage along with teaching sheet. Demostrated care of drain.

## 2011-11-10 NOTE — Progress Notes (Signed)
UR complete 

## 2011-11-10 NOTE — Discharge Instructions (Signed)
[facility] ______[facility] LAPAROSCOPIC SURGERY: POST OP INSTRUCTIONS Always review your discharge instruction sheet given to you by the facility where your surgery was performed. IF YOU HAVE DISABILITY OR FAMILY LEAVE FORMS, YOU MUST BRING THEM TO THE OFFICE FOR PROCESSING.   DO NOT GIVE THEM TO YOUR DOCTOR.  1. A prescription for pain medication may be given to you upon discharge.  Take your pain medication as prescribed, if needed.  If narcotic pain medicine is not needed, then you may take acetaminophen (Tylenol) or ibuprofen (Advil) as needed. 2. Take your usually prescribed medications unless otherwise directed. 3. If you need a refill on your pain medication, please contact your pharmacy.  They will contact our office to request authorization. Prescriptions will not be filled after 5pm or on week-ends. 4. You should follow a light diet the first few days after arrival home, such as soup and crackers, etc.  Be sure to include lots of fluids daily. 5. Most patients will experience some swelling and bruising in the area of the incisions.  Ice packs will help.  Swelling and bruising can take several days to resolve.  6. It is common to experience some constipation if taking pain medication after surgery.  Increasing fluid intake and taking a stool softener (such as Colace) will usually help or prevent this problem from occurring.  A mild laxative (Milk of Magnesia or Miralax) should be taken according to package instructions if there are no bowel movements after 48 hours. 7. Unless discharge instructions indicate otherwise, you may remove your bandages 24-48 hours after surgery, and you may shower at that time.  You may have steri-strips (small skin tapes) in place directly over the incision.  These strips should be left on the skin for 7-10 days.  If your surgeon used skin glue on the incision, you may shower in 24 hours.  The glue will flake off over the next 2-3 weeks.  Any sutures or  staples will be removed at the office during your follow-up visit. 8. ACTIVITIES:  You may resume regular (light) daily activities beginning the next day--such as daily self-care, walking, climbing stairs--gradually increasing activities as tolerated.  You may have sexual intercourse when it is comfortable.  Refrain from any heavy lifting or straining until approved by your doctor. a. You may drive when you are no longer taking prescription pain medication, you can comfortably wear a seatbelt, and you can safely maneuver your car and apply brakes. b. RETURN TO WORK:  __________________________________________________________ 9. You should see your doctor in the office for a follow-up appointment approximately 2-3 weeks after your surgery.  Make sure that you call for this appointment within a day or two after you arrive home to insure a convenient appointment time. 10. OTHER INSTRUCTIONS: __________________________________________________________________________________________________________________________ __________________________________________________________________________________________________________________________ WHEN TO CALL YOUR DOCTOR: 1. Fever over 101.0 2. Inability to urinate 3. Continued bleeding from incision. 4. Increased pain, redness, or drainage from the incision. 5. Increasing abdominal pain  The clinic staff is available to answer your questions during regular business hours.  Please don't hesitate to call and ask to speak to one of the nurses for clinical concerns.  If you have a medical emergency, go to the nearest emergency room or call 911.  A surgeon from [facility] is always on call at the hospital. [address], [address], [place], [place]  [address] ? [address] [address], [place], [place]   [address] [phone] ? [phone] ? FAX [phone] Web site:   [email or web address]  Bulb Drain Home Care A bulb drain is a small, plastic  reservoir which creates a gentle suction. It is used to remove excess fluid from a surgical wound. The color and amount of fluid will vary. Immediately after surgery, the fluid is bright red. It may gradually change to a yellow color. When the amount decreases to about 1 or 2 tablespoons (15 to 30 cc) per 24 hours, your caregiver will usually remove it. DAILY CARE  Keep the bulb compressed at all times, except while emptying it. The compression creates suction.   Keep sites where the tubes enter the skin dry and covered with a light bandage (dressing).   Tape the tubes to your skin, 1 to 2 inches below the insertion sites, to keep from pulling on your stitches. Tubes are stitched in place and will not slip out.   Pin the bulb to your shirt (not to your pants) with a safety pin.   For the first few days after surgery, there usually is more fluid in the bulb. Empty the bulb whenever it becomes half full because the bulb does not create enough suction if it is too full. Include this amount in your 24 hour totals.   When the amount of drainage decreases, empty the bulb at the same time every day. Write down the amounts and the 24 hour totals. Your caregiver will want to know them. This helps your caregiver know when the tubes can be removed.   Once you are home, it is no longer necessary to strip the tubes as may have been done in the hospital. If there is drainage around the tube sites, change dressings and keep the area dry. If you see a clot in the tube, leave it alone. However, if the tube does not appear to be draining, let your caregiver know.  TO EMPTY THE BULB  Open the stopper to release suction.   Holding the stopper out of the way, pour drainage into the measuring cup that was sent home with you.   Measure and write down the amount. If there are 2 bulbs, note the amount of drainage from bulb 1 or bulb 2 and keep the totals separate. Your caregiver will want to know which tube is draining  more.   Compress the bulb by folding it in half.   Replace the stopper.   Check the tape that holds the tube to your skin, and pin the bulb to your shirt.  SEEK MEDICAL CARE IF:  The drainage develops a bad odor.   You have an oral temperature above 102 F (38.9 C).   The amount of drainage from your wound suddenly increases or decreases.   You accidentally pull out your drain.   You have any other questions or concerns.  MAKE SURE YOU:   Understand these instructions.   Will watch your condition.   Will get help right away if you are not doing well or get worse.  Document Released: 06/26/2000 Document Revised: 06/18/2011 Document Reviewed: 06/29/2005 [employer] Patient Information 2012 [employer]. 

## 2011-11-13 ENCOUNTER — Ambulatory Visit (INDEPENDENT_AMBULATORY_CARE_PROVIDER_SITE_OTHER): Payer: Medicaid Other | Admitting: General Surgery

## 2011-11-13 VITALS — BP 108/74 | HR 68 | Temp 99.0°F | Resp 18 | Ht 62.0 in | Wt 117.2 lb

## 2011-11-13 DIAGNOSIS — Z4889 Encounter for other specified surgical aftercare: Secondary | ICD-10-CM

## 2011-11-13 DIAGNOSIS — Z5189 Encounter for other specified aftercare: Secondary | ICD-10-CM

## 2011-11-13 NOTE — Progress Notes (Signed)
Patient came in to office for drain removal s/p laparoscopic cholecystectomy.  Prior to drain removal output 15 cc's of serosanguineous fluid.  Removed drain, placed gauze at drain site, patient tolerated well. Patient has follow up appointment 11/27/11 @ 9:45 w/Dr. Biagio Quint.  She is doing well on regular diet. Drain removed. No issues

## 2011-11-24 ENCOUNTER — Ambulatory Visit: Payer: Medicaid Other | Admitting: Family Medicine

## 2011-11-27 ENCOUNTER — Encounter (INDEPENDENT_AMBULATORY_CARE_PROVIDER_SITE_OTHER): Payer: Medicaid Other | Admitting: General Surgery

## 2011-12-04 ENCOUNTER — Encounter (INDEPENDENT_AMBULATORY_CARE_PROVIDER_SITE_OTHER): Payer: Self-pay | Admitting: General Surgery

## 2012-03-05 ENCOUNTER — Encounter (HOSPITAL_COMMUNITY): Payer: Self-pay | Admitting: *Deleted

## 2012-03-05 ENCOUNTER — Emergency Department (HOSPITAL_COMMUNITY)
Admission: EM | Admit: 2012-03-05 | Discharge: 2012-03-05 | Disposition: A | Discharge status: Home / Self Care | Payer: Medicaid Other | Attending: Emergency Medicine | Admitting: Emergency Medicine

## 2012-03-05 DIAGNOSIS — Z880 Allergy status to penicillin: Secondary | ICD-10-CM | POA: Insufficient documentation

## 2012-03-05 DIAGNOSIS — K029 Dental caries, unspecified: Secondary | ICD-10-CM | POA: Insufficient documentation

## 2012-03-05 DIAGNOSIS — K0889 Other specified disorders of teeth and supporting structures: Secondary | ICD-10-CM

## 2012-03-05 MED ORDER — CLINDAMYCIN HCL 300 MG PO CAPS: 300.0000 mg | ORAL_CAPSULE | Freq: Four times a day (QID) | ORAL | Status: AC

## 2012-03-05 MED ORDER — OXYCODONE-ACETAMINOPHEN 5-325 MG PO TABS: 1.0000 | ORAL_TABLET | ORAL | Status: AC | PRN

## 2012-03-05 NOTE — ED Notes (Signed)
Pt sitting up rocking in bed. Upset that she has not seen MD and she has been here "an hour"  Attempted to explain delay, family member at bedside.  Pt states pain is on right side, back of head x 2 days. Rates pain at 10/10 call bell at bedside

## 2012-03-05 NOTE — ED Notes (Signed)
Pt c/o HA x 2 days, photophobia.  C/o right sided HA pain.  Denies n/v, SOB, CP.

## 2012-03-05 NOTE — ED Notes (Signed)
Pt d/c'd to registration desk to look for medicaid card that she left her when she had her gallbladder surgery a few weeks ago. Family at side

## 2012-03-05 NOTE — ED Provider Notes (Signed)
History     CSN: [id number]  Arrival date & time 03/05/12  0600   First MD Initiated Contact with Patient 03/05/12 0701      Chief Complaint  Patient presents with  . Headache    (Consider location/radiation/quality/duration/timing/severity/associated sxs/prior treatment) HPI Comments: 41 y/o female with sudden onset intermittent right sided pain near her right ear worsening over the past 3 days. Tried taking ibuprofen without relief. Describes the pain as throbbing, rated 10/10 worse when she touches that side of her face and in her mouth. Cannot recall if she is having sensitivity to cold in her mouth. Denies any actual headache or ear ache. Denies visual disturbance, photophobia, lightheadedness, dizziness, fever, chills, neck pain. Does not have a dentist.  Patient is a 41 y.o. female presenting with headaches. The history is provided by the patient.  Headache  Pertinent negatives include no fever.    Past Medical History  Diagnosis Date  . Gallstones   . Depression   . Anxiety     Past Surgical History  Procedure Date  . Cholecystectomy 11/07/2011  . Cholecystectomy 11/09/2011    Procedure: LAPAROSCOPIC CHOLECYSTECTOMY;  Surgeon: Lodema Pilot, DO;  Location: [facility] OR;  Service: General;  Laterality: N/A;  Lap. Chole. with IOC    History reviewed. No pertinent family history.  History  Substance Use Topics  . Smoking status: Current Everyday Smoker -- 0.5 packs/day    Types: Cigarettes  . Smokeless tobacco: Never Used  . Alcohol Use: Yes    OB History    Grav Para Term Preterm Abortions TAB SAB Ect Mult Living                  Review of Systems  Constitutional: Negative for fever and chills.  HENT: Positive for dental problem. Negative for ear pain and neck pain.   Eyes: Negative for photophobia and visual disturbance.  Neurological: Positive for headaches (right sided head pain).    Allergies  Amoxicillin  Home Medications  No current outpatient  prescriptions on file.  BP 97/64  Temp 98.3 F (36.8 C) (Oral)  Resp 18  SpO2 100%  Physical Exam  Constitutional: She is oriented to person, place, and time. She appears well-developed and well-nourished. No distress.  HENT:  Head: Normocephalic and atraumatic. No trismus in the jaw.  Right Ear: Tympanic membrane, external ear and ear canal normal. No tenderness. No mastoid tenderness.  Left Ear: Tympanic membrane, external ear and ear canal normal. No tenderness. No mastoid tenderness.  Mouth/Throat: Oropharynx is clear and moist and mucous membranes are normal. No oral lesions. Abnormal dentition. Dental caries (right upper third molar) present. No dental abscesses.       Tenderness on right maxillary region near TMJ.  Eyes: Conjunctivae and EOM are normal. Pupils are equal, round, and reactive to light.  Neck: Normal range of motion. Neck supple. No rigidity.  Cardiovascular: Normal rate, regular rhythm and normal heart sounds.   Pulmonary/Chest: Effort normal and breath sounds normal.  Neurological: She is alert and oriented to person, place, and time. She displays a negative Romberg sign.  Skin: Skin is warm and dry. No rash noted.  Psychiatric: She has a normal mood and affect. Her speech is normal and behavior is normal.    ED Course  Procedures (including critical care time)  Labs Reviewed - No data to display No results found.   1. Dental caries   2. Pain, dental       MDM  41  y/o female with right sided pain near her ear exacerbated with palpation on right upper 3rd molar. Evidence of dental caries. Will manage pain and prescribe abx. Discussed f/u with dentist within 48 hours.        Trevor Mace, PA-C 03/05/12 [phone]

## 2012-03-06 NOTE — ED Provider Notes (Signed)
Medical screening examination/treatment/procedure(s) were conducted as a shared visit with non-physician practitioner(s) and myself.  I personally evaluated the patient during the encounter.  40yF with HA and R ear pain. Likely from dental process. Multiple dental caries and mild r sided facial swelling. No drainable collection noted. R TM, ext aud canal, mastoid region unremarkable. Posterior pharynx clear, handling secretions and submental tissues soft. Plan symptomatic tx and dental referral. Return precautions discussed.  Raeford Razor, MD 03/06/12 [phone]

## 2012-03-22 ENCOUNTER — Emergency Department (HOSPITAL_COMMUNITY)
Admission: EM | Admit: 2012-03-22 | Discharge: 2012-03-23 | Disposition: A | Discharge status: Home / Self Care | Payer: Medicaid Other | Attending: Emergency Medicine | Admitting: Emergency Medicine

## 2012-03-22 ENCOUNTER — Encounter (HOSPITAL_COMMUNITY): Payer: Self-pay | Admitting: *Deleted

## 2012-03-22 ENCOUNTER — Emergency Department (HOSPITAL_COMMUNITY): Payer: Medicaid Other

## 2012-03-22 DIAGNOSIS — R1013 Epigastric pain: Secondary | ICD-10-CM | POA: Insufficient documentation

## 2012-03-22 DIAGNOSIS — R079 Chest pain, unspecified: Secondary | ICD-10-CM | POA: Insufficient documentation

## 2012-03-22 DIAGNOSIS — Z9089 Acquired absence of other organs: Secondary | ICD-10-CM | POA: Insufficient documentation

## 2012-03-22 DIAGNOSIS — F3289 Other specified depressive episodes: Secondary | ICD-10-CM | POA: Insufficient documentation

## 2012-03-22 DIAGNOSIS — R112 Nausea with vomiting, unspecified: Secondary | ICD-10-CM | POA: Insufficient documentation

## 2012-03-22 DIAGNOSIS — F411 Generalized anxiety disorder: Secondary | ICD-10-CM | POA: Insufficient documentation

## 2012-03-22 DIAGNOSIS — J449 Chronic obstructive pulmonary disease, unspecified: Secondary | ICD-10-CM | POA: Insufficient documentation

## 2012-03-22 DIAGNOSIS — F17200 Nicotine dependence, unspecified, uncomplicated: Secondary | ICD-10-CM | POA: Insufficient documentation

## 2012-03-22 DIAGNOSIS — R109 Unspecified abdominal pain: Secondary | ICD-10-CM

## 2012-03-22 DIAGNOSIS — Z880 Allergy status to penicillin: Secondary | ICD-10-CM | POA: Insufficient documentation

## 2012-03-22 DIAGNOSIS — F329 Major depressive disorder, single episode, unspecified: Secondary | ICD-10-CM | POA: Insufficient documentation

## 2012-03-22 DIAGNOSIS — J4489 Other specified chronic obstructive pulmonary disease: Secondary | ICD-10-CM | POA: Insufficient documentation

## 2012-03-22 HISTORY — DX: Chronic obstructive pulmonary disease, unspecified: J44.9

## 2012-03-22 LAB — CBC WITH DIFFERENTIAL/PLATELET
Eosinophils Absolute: 0.1 10*3/uL (ref 0.0–0.7)
Eosinophils Relative: 1 % (ref 0–5)
HCT: 38.9 % (ref 36.0–46.0)
Lymphocytes Relative: 38 % (ref 12–46)
Lymphs Abs: 3.4 10*3/uL (ref 0.7–4.0)
MCH: 29.7 pg (ref 26.0–34.0)
MCV: 86.3 fL (ref 78.0–100.0)
Monocytes Absolute: 0.8 10*3/uL (ref 0.1–1.0)
Monocytes Relative: 9 % (ref 3–12)
Platelets: 191 10*3/uL (ref 150–400)
RBC: 4.51 MIL/uL (ref 3.87–5.11)
WBC: 9.1 10*3/uL (ref 4.0–10.5)

## 2012-03-22 LAB — COMPREHENSIVE METABOLIC PANEL
ALT: 17 U/L (ref 0–35)
BUN: 9 mg/dL (ref 6–23)
CO2: 23 mEq/L (ref 19–32)
Calcium: 9.9 mg/dL (ref 8.4–10.5)
Creatinine, Ser: 0.74 mg/dL (ref 0.50–1.10)
GFR calc Af Amer: >90 mL/min (ref >90)
GFR calc non Af Amer: >90 mL/min (ref >90)
Glucose, Bld: 91 mg/dL (ref 70–99)
Total Protein: 7.1 g/dL (ref 6.0–8.3)

## 2012-03-22 MED ORDER — HYDROMORPHONE HCL PF 1 MG/ML IJ SOLN
1.0000 mg | Freq: Once | INTRAMUSCULAR | Status: AC
  Administered 2012-03-22: 1 mg via INTRAVENOUS
  Filled 2012-03-22: qty 1

## 2012-03-22 MED ORDER — SODIUM CHLORIDE 0.9 % IV SOLN
Freq: Once | INTRAVENOUS | Status: AC
  Administered 2012-03-22: 23:00:00 via INTRAVENOUS

## 2012-03-22 MED ORDER — PANTOPRAZOLE SODIUM 40 MG IV SOLR
40.0000 mg | Freq: Once | INTRAVENOUS | Status: AC
  Administered 2012-03-22: 40 mg via INTRAVENOUS
  Filled 2012-03-22: qty 40

## 2012-03-22 MED ORDER — ONDANSETRON HCL 4 MG/2ML IJ SOLN
4.0000 mg | Freq: Once | INTRAMUSCULAR | Status: AC
  Administered 2012-03-22: 4 mg via INTRAVENOUS
  Filled 2012-03-22: qty 2

## 2012-03-22 NOTE — ED Notes (Signed)
Per PTAR- pt was walking and began having chest pain. Pain is reproducible with palpation, inspiration, and movement. Pt has been anxious with PTAR. VSS. Pain is located in generalized chest and back.

## 2012-03-22 NOTE — ED Provider Notes (Addendum)
History     CSN: [id number]  Arrival date & time 03/22/12  2203   First MD Initiated Contact with Patient 03/22/12 2226      Chief Complaint  Patient presents with  . Chest Pain    (Consider location/radiation/quality/duration/timing/severity/associated sxs/prior treatment) Patient is a 41 y.o. female presenting with chest pain. The history is provided by the patient.  Chest Pain   She had onset this morning of sharp left and mid chest pain and epigastric pain with radiation through to the back. There is associated nausea and vomiting. Pain is worse with a deep breath in she does notice some dyspnea. She's also had some diaphoresis. She's concerned about her chest pain because her husband had a heart attack recently and required resuscitation several times. Her pain is severe and she rates it at 9/10. It has been waxing and waning during the course of the day. Nothing makes it better nothing makes it worse. She is status post cholecystectomy in April of this year. She denies alcohol consumption but she does smoke about one quarter pack of cigarettes a day. She denies history of hypertension, diabetes, hyperlipidemia.  Past Medical History  Diagnosis Date  . Gallstones   . Depression   . Anxiety   . COPD (chronic obstructive pulmonary disease)     Past Surgical History  Procedure Date  . Cholecystectomy 11/07/2011  . Cholecystectomy 11/09/2011    Procedure: LAPAROSCOPIC CHOLECYSTECTOMY;  Surgeon: Lodema Pilot, DO;  Location: [facility] OR;  Service: General;  Laterality: N/A;  Lap. Chole. with IOC    No family history on file.  History  Substance Use Topics  . Smoking status: Current Everyday Smoker -- 0.5 packs/day    Types: Cigarettes  . Smokeless tobacco: Never Used  . Alcohol Use: Yes    OB History    Grav Para Term Preterm Abortions TAB SAB Ect Mult Living                  Review of Systems  Cardiovascular: Positive for chest pain.  All other systems reviewed and are  negative.    Allergies  Amoxicillin  Home Medications   Current Outpatient Rx  Name Route Sig Dispense Refill  . BUDESONIDE-FORMOTEROL FUMARATE 160-4.5 MCG/ACT IN AERO Inhalation Inhale 2 puffs into the lungs daily as needed. For shortness of breath      BP 111/81  Pulse 82  Temp 97.1 F (36.2 C) (Oral)  Resp 19  SpO2 94%  Physical Exam  Nursing note and vitals reviewed. 41year old female, resting comfortably and in no acute distress. Vital signs are normal. Oxygen saturation is 94%, which is normal. Head is normocephalic and atraumatic. PERRLA, EOMI. no scleral icterus present. Oropharynx is clear. Neck is nontender and supple without adenopathy or JVD. Back is nontender and there is no CVA tenderness. Lungs are clear without rales, wheezes, or rhonchi. Chest has mild tenderness in the midsternal area. Heart has regular rate and rhythm without murmur. Abdomen is soft, flat, with moderate tenderness in the epigastric area without rebound or guarding. There are no masses or hepatosplenomegaly and peristalsis is hypoactive. Extremities have no cyanosis or edema, full range of motion is present. Skin is warm and dry without rash. Neurologic: Mental status is normal, cranial nerves are intact, there are no motor or sensory deficits.   ED Course  Procedures (including critical care time)  Results for orders placed during the hospital encounter of 03/22/12  CBC WITH DIFFERENTIAL  Component Value Range   WBC 9.1  4.0 - 10.5 K/uL   RBC 4.51  3.87 - 5.11 MIL/uL   Hemoglobin 13.4  12.0 - 15.0 g/dL   HCT 96.0  45.4 - 09.8 %   MCV 86.3  78.0 - 100.0 fL   MCH 29.7  26.0 - 34.0 pg   MCHC 34.4  30.0 - 36.0 g/dL   RDW 11.9  14.7 - 82.9 %   Platelets 191  150 - 400 K/uL   Neutrophils Relative 52  43 - 77 %   Neutro Abs 4.8  1.7 - 7.7 K/uL   Lymphocytes Relative 38  12 - 46 %   Lymphs Abs 3.4  0.7 - 4.0 K/uL   Monocytes Relative 9  3 - 12 %   Monocytes Absolute 0.8  0.1 -  1.0 K/uL   Eosinophils Relative 1  0 - 5 %   Eosinophils Absolute 0.1  0.0 - 0.7 K/uL   Basophils Relative 0  0 - 1 %   Basophils Absolute 0.0  0.0 - 0.1 K/uL  COMPREHENSIVE METABOLIC PANEL      Component Value Range   Sodium 137  135 - 145 mEq/L   Potassium 3.6  3.5 - 5.1 mEq/L   Chloride 103  96 - 112 mEq/L   CO2 23  19 - 32 mEq/L   Glucose, Bld 91  70 - 99 mg/dL   BUN 9  6 - 23 mg/dL   Creatinine, Ser 5.62  0.50 - 1.10 mg/dL   Calcium 9.9  8.4 - 13.0 mg/dL   Total Protein 7.1  6.0 - 8.3 g/dL   Albumin 4.2  3.5 - 5.2 g/dL   AST 17  0 - 37 U/L   ALT 17  0 - 35 U/L   Alkaline Phosphatase 93  39 - 117 U/L   Total Bilirubin 0.5  0.3 - 1.2 mg/dL   GFR calc non Af Amer >90  >90 mL/min   GFR calc Af Amer >90  >90 mL/min  LIPASE, BLOOD      Component Value Range   Lipase 20  11 - 59 U/L  TROPONIN I      Component Value Range   Troponin I <0.30  <0.30 ng/mL   Dg Chest 2 View  03/22/2012  *RADIOLOGY REPORT*  Clinical Data: Mid chest pain.  CHEST - 2 VIEW  Comparison: None.  Findings: Hyperinflation suggesting emphysematous changes. The heart size and pulmonary vascularity are normal. The lungs appear clear and expanded without focal air space disease or consolidation. No blunting of the costophrenic angles.  No pneumothorax.  Mediastinal contours appear intact.  Surgical clips in the right upper quadrant.  No significant changes since the previous study.  IMPRESSION: Emphysematous changes in the lungs.  No evidence of active pulmonary disease.   Original Report Authenticated By: Marlon Pel, M.D.       Date: 03/22/2012  Rate: 87  Rhythm: normal sinus rhythm and sinus arrhythmia  QRS Axis: normal  Intervals: PR shortened  ST/T Wave abnormalities: normal  Conduction Disutrbances:none  Narrative Interpretation: Normal sinus rhythm with sinus arrhythmia. Short PR interval. No prior ECG available for comparison.  Old EKG Reviewed: none available    1. ABDOMINAL PAIN        MDM  Chest and upper abdominal pain which seems far more likely to be of GI origin than cardiac origin. EKG shows no acute changes and she has no significant cardiac risk factors other  than cigarette smoking. Cardiac markers will be checked, however the main thrust of evaluation will be for GI causes of pain. I am concerned about possibility of pancreatitis. She will be treated with IV narcotics and antiemetics and proton pump inhibitor.  Laboratory workup was unremarkable including normal lipase and troponin. She feels significantly better after hydromorphone, ondansetron, and pantoprazole. Exam shows persistent but much less significant epigastric tenderness. She will be given a GI cocktail and reassessed. At this point, I believe that she can be safely discharged with prescription for proton pump inhibitor.  Dione Booze, MD 03/23/12 0014  She got significant relief from a GI cocktail. Prescription was given for Prilosec OTC and she is to followup with [facility].  Dione Booze, MD 03/23/12 Earle Gell

## 2012-03-23 MED ORDER — METOCLOPRAMIDE HCL 10 MG PO TABS: 10.0000 mg | ORAL_TABLET | Freq: Four times a day (QID) | ORAL | Status: DC | PRN

## 2012-03-23 MED ORDER — OMEPRAZOLE 20 MG PO CPDR: 20.0000 mg | DELAYED_RELEASE_CAPSULE | Freq: Every day | ORAL | Status: DC

## 2012-03-23 MED ORDER — GI COCKTAIL ~~LOC~~
30.0000 mL | Freq: Once | ORAL | Status: DC
  Filled 2012-03-23: qty 30

## 2012-03-23 NOTE — ED Notes (Signed)
See paper charting as well

## 2012-12-25 ENCOUNTER — Emergency Department (HOSPITAL_COMMUNITY)
Admission: EM | Admit: 2012-12-25 | Discharge: 2012-12-25 | Disposition: A | Discharge status: Home / Self Care | Payer: Medicaid Other | Attending: Emergency Medicine | Admitting: Emergency Medicine

## 2012-12-25 ENCOUNTER — Encounter (HOSPITAL_COMMUNITY): Payer: Self-pay | Admitting: Emergency Medicine

## 2012-12-25 DIAGNOSIS — F329 Major depressive disorder, single episode, unspecified: Secondary | ICD-10-CM | POA: Insufficient documentation

## 2012-12-25 DIAGNOSIS — Z79899 Other long term (current) drug therapy: Secondary | ICD-10-CM | POA: Insufficient documentation

## 2012-12-25 DIAGNOSIS — R21 Rash and other nonspecific skin eruption: Secondary | ICD-10-CM | POA: Insufficient documentation

## 2012-12-25 DIAGNOSIS — F3289 Other specified depressive episodes: Secondary | ICD-10-CM | POA: Insufficient documentation

## 2012-12-25 DIAGNOSIS — J449 Chronic obstructive pulmonary disease, unspecified: Secondary | ICD-10-CM | POA: Insufficient documentation

## 2012-12-25 DIAGNOSIS — J4489 Other specified chronic obstructive pulmonary disease: Secondary | ICD-10-CM | POA: Insufficient documentation

## 2012-12-25 DIAGNOSIS — F411 Generalized anxiety disorder: Secondary | ICD-10-CM | POA: Insufficient documentation

## 2012-12-25 DIAGNOSIS — F17200 Nicotine dependence, unspecified, uncomplicated: Secondary | ICD-10-CM | POA: Insufficient documentation

## 2012-12-25 DIAGNOSIS — Z8719 Personal history of other diseases of the digestive system: Secondary | ICD-10-CM | POA: Insufficient documentation

## 2012-12-25 MED ORDER — DIPHENHYDRAMINE HCL 25 MG PO CAPS
25.0000 mg | ORAL_CAPSULE | Freq: Once | ORAL | Status: AC
  Administered 2012-12-25: 25 mg via ORAL
  Filled 2012-12-25: qty 1

## 2012-12-25 MED ORDER — PREDNISONE 20 MG PO TABS: 60.0000 mg | ORAL_TABLET | Freq: Every day | ORAL | Status: DC

## 2012-12-25 MED ORDER — FAMOTIDINE 20 MG PO TABS
20.0000 mg | ORAL_TABLET | Freq: Once | ORAL | Status: AC
  Administered 2012-12-25: 20 mg via ORAL
  Filled 2012-12-25: qty 1

## 2012-12-25 MED ORDER — PREDNISONE 20 MG PO TABS
60.0000 mg | ORAL_TABLET | Freq: Once | ORAL | Status: AC
  Administered 2012-12-25: 60 mg via ORAL
  Filled 2012-12-25: qty 3

## 2012-12-25 NOTE — ED Notes (Signed)
Patient stated that the rash started 5 days ago when her medications were changed. Patient has been taking Benadryl with no relief.

## 2012-12-25 NOTE — ED Provider Notes (Signed)
History    This chart was scribed for Stacy Pierce, non-physician practitioner working with Richardean Canal, MD by Leone Payor, ED Scribe. This patient was seen in room TR07C/TR07C and the patient's care was started at 1424.   CSN: [id number]  Arrival date & time 12/25/12  1424   First MD Initiated Contact with Patient 12/25/12 1608      Chief Complaint  Patient presents with  . Rash     The history is provided by the patient. No language interpreter was used.    Stacy Pierce is a 42 y.o. female who presents to the Emergency Department complaining of an ongoing, constant, gradually worsening rash to the body starting 4-5 days ago. States her medication has been changed to Abilify and Wellbutrin about the same time as onset of symptoms. She has associated itching and burning to touch. She has applied hydrocortisone and hemorrhoid cream with mild relief. The rash is located to the bilateral arms, bilateral legs, abdomen, and back. She denies swelling of lips, tongue, throat. She also has had some nausea and chills.  She denies fever, vomiting, SOB.  She denies any new lotions, detergents, or soaps.   Past Medical History  Diagnosis Date  . Gallstones   . Depression   . Anxiety   . COPD (chronic obstructive pulmonary disease)     Past Surgical History  Procedure Laterality Date  . Cholecystectomy  11/07/2011  . Cholecystectomy  11/09/2011    Procedure: LAPAROSCOPIC CHOLECYSTECTOMY;  Surgeon: Lodema Pilot, DO;  Location: [facility] OR;  Service: General;  Laterality: N/A;  Lap. Chole. with IOC    No family history on file.  History  Substance Use Topics  . Smoking status: Current Every Day Smoker -- 0.50 packs/day    Types: Cigarettes  . Smokeless tobacco: Never Used  . Alcohol Use: Yes    OB History   Grav Para Term Preterm Abortions TAB SAB Ect Mult Living                  Review of Systems  Constitutional: Positive for chills. Negative for fever.  HENT:  Negative for trouble swallowing.   Respiratory: Negative for shortness of breath.   Gastrointestinal: Positive for nausea. Negative for vomiting.  Skin: Positive for rash.  All other systems reviewed and are negative.    Allergies  Amoxicillin  Home Medications   Current Outpatient Rx  Name  Route  Sig  Dispense  Refill  . ARIPiprazole (ABILIFY) 5 MG tablet   Oral   Take 5 mg by mouth daily.         Marland Kitchen buPROPion (WELLBUTRIN XL) 150 MG 24 hr tablet   Oral   Take 150 mg by mouth daily.         . hydrocortisone cream 1 %   Topical   Apply 1 application topically 2 (two) times daily.         Marland Kitchen ibuprofen (ADVIL,MOTRIN) 200 MG tablet   Oral   Take 400 mg by mouth every 6 (six) hours as needed for pain.           BP 139/89  Pulse 88  Temp(Src) 98.9 F (37.2 C) (Oral)  Resp 18  SpO2 100%  Physical Exam  Nursing note and vitals reviewed. Constitutional: She appears well-developed and well-nourished.  HENT:  Head: Normocephalic and atraumatic.  Mouth/Throat: Oropharynx is clear and moist.  No swelling of the lips, tongue, or throat noted.   Eyes: EOM  are normal. Pupils are equal, round, and reactive to light.  Neck: Normal range of motion. Neck supple.  Cardiovascular: Normal rate, regular rhythm and normal heart sounds.   Pulmonary/Chest: Effort normal and breath sounds normal. She has no wheezes.  Musculoskeletal: Normal range of motion.  Neurological: She is alert.  Skin: Skin is warm and dry. Rash noted. There is erythema.  Erythematous, papular, diffuse rash located to bilateral upper and lower extremities, abdomen, and back. No drainage noted.     Psychiatric: She has a normal mood and affect. Her behavior is normal.    ED Course  Procedures (including critical care time)  DIAGNOSTIC STUDIES: Oxygen Saturation is 100% on RA, normal by my interpretation.    COORDINATION OF CARE: 5:48 PM Discussed treatment plan with pt at bedside and pt agreed to  plan.    Labs Reviewed - No data to display No results found.   No diagnosis found.    MDM  Patient presenting with a rash of the extremities, abdomen, and back after starting on new medication.  Feel that rash is most likely an Allergic Reaction.  Patient instructed to stop the medication and discuss with physician that prescribed the medication tomorrow morning.  No signs of angioedema.  Patient given Benadryl, Pepcid, and Prednisone in the ED.  Patient stable for discharge.  Return precautions given.  I personally performed the services described in this documentation, which was scribed in my presence. The recorded information has been reviewed and is accurate.   Pascal Lux Beauregard, PA-C 12/26/12 [phone]

## 2012-12-25 NOTE — ED Notes (Signed)
Patient stated that the itching is getting better.

## 2012-12-25 NOTE — ED Notes (Signed)
Pt. Stated, I've had a rash for about 2 weeks and I think its cause they switched my medication.  Nothing Ive tried has help.

## 2012-12-30 NOTE — ED Provider Notes (Signed)
Medical screening examination/treatment/procedure(s) were performed by non-physician practitioner and as supervising physician I was immediately available for consultation/collaboration.   Brylen Wagar H Tag Wurtz, MD 12/30/12 0924 

## 2013-01-12 ENCOUNTER — Encounter (HOSPITAL_COMMUNITY): Payer: Self-pay | Admitting: Emergency Medicine

## 2013-01-12 ENCOUNTER — Emergency Department (HOSPITAL_COMMUNITY)
Admission: EM | Admit: 2013-01-12 | Discharge: 2013-01-12 | Disposition: A | Discharge status: Home / Self Care | Payer: Medicaid Other | Attending: Emergency Medicine | Admitting: Emergency Medicine

## 2013-01-12 DIAGNOSIS — F329 Major depressive disorder, single episode, unspecified: Secondary | ICD-10-CM | POA: Insufficient documentation

## 2013-01-12 DIAGNOSIS — Z8719 Personal history of other diseases of the digestive system: Secondary | ICD-10-CM | POA: Insufficient documentation

## 2013-01-12 DIAGNOSIS — J449 Chronic obstructive pulmonary disease, unspecified: Secondary | ICD-10-CM | POA: Insufficient documentation

## 2013-01-12 DIAGNOSIS — IMO0002 Reserved for concepts with insufficient information to code with codable children: Secondary | ICD-10-CM | POA: Insufficient documentation

## 2013-01-12 DIAGNOSIS — F17200 Nicotine dependence, unspecified, uncomplicated: Secondary | ICD-10-CM | POA: Insufficient documentation

## 2013-01-12 DIAGNOSIS — J4489 Other specified chronic obstructive pulmonary disease: Secondary | ICD-10-CM | POA: Insufficient documentation

## 2013-01-12 DIAGNOSIS — L509 Urticaria, unspecified: Secondary | ICD-10-CM | POA: Insufficient documentation

## 2013-01-12 DIAGNOSIS — Z79899 Other long term (current) drug therapy: Secondary | ICD-10-CM | POA: Insufficient documentation

## 2013-01-12 DIAGNOSIS — F3289 Other specified depressive episodes: Secondary | ICD-10-CM | POA: Insufficient documentation

## 2013-01-12 DIAGNOSIS — L299 Pruritus, unspecified: Secondary | ICD-10-CM | POA: Insufficient documentation

## 2013-01-12 DIAGNOSIS — R21 Rash and other nonspecific skin eruption: Secondary | ICD-10-CM | POA: Insufficient documentation

## 2013-01-12 DIAGNOSIS — T7840XA Allergy, unspecified, initial encounter: Secondary | ICD-10-CM

## 2013-01-12 DIAGNOSIS — T450X5A Adverse effect of antiallergic and antiemetic drugs, initial encounter: Secondary | ICD-10-CM | POA: Insufficient documentation

## 2013-01-12 MED ORDER — FAMOTIDINE IN NACL 20-0.9 MG/50ML-% IV SOLN
20.0000 mg | Freq: Once | INTRAVENOUS | Status: AC
  Administered 2013-01-12: 20 mg via INTRAVENOUS
  Filled 2013-01-12: qty 50

## 2013-01-12 MED ORDER — SODIUM CHLORIDE 0.9 % IV BOLUS (SEPSIS)
1000.0000 mL | Freq: Once | INTRAVENOUS | Status: AC
  Administered 2013-01-12: 1000 mL via INTRAVENOUS

## 2013-01-12 MED ORDER — ONDANSETRON 4 MG PO TBDP
4.0000 mg | ORAL_TABLET | Freq: Once | ORAL | Status: AC
  Administered 2013-01-12: 4 mg via ORAL
  Filled 2013-01-12: qty 1

## 2013-01-12 MED ORDER — DEXAMETHASONE SODIUM PHOSPHATE 10 MG/ML IJ SOLN
10.0000 mg | Freq: Once | INTRAMUSCULAR | Status: AC
  Administered 2013-01-12: 10 mg via INTRAVENOUS
  Filled 2013-01-12: qty 1

## 2013-01-12 MED ORDER — ALBUTEROL SULFATE (5 MG/ML) 0.5% IN NEBU
2.5000 mg | INHALATION_SOLUTION | Freq: Once | RESPIRATORY_TRACT | Status: AC
  Administered 2013-01-12: 2.5 mg via RESPIRATORY_TRACT
  Filled 2013-01-12: qty 0.5

## 2013-01-12 MED ORDER — FAMOTIDINE 20 MG PO TABS: 20.0000 mg | ORAL_TABLET | Freq: Two times a day (BID) | ORAL | Status: DC

## 2013-01-12 MED ORDER — PREDNISONE 20 MG PO TABS: 40.0000 mg | ORAL_TABLET | Freq: Every day | ORAL | Status: DC

## 2013-01-12 NOTE — ED Notes (Addendum)
Per EMS, pt began having an allergic reaction to her Lithium x2 days ago, urticaria to neck and groin especially. Pt discontinued Lithium and began taking OTC Benadryl and Zyrtec.  Pt states itching and urticaria diminishes for short periods of time, but will continue after medication runs "out of her system."  Pt also had a reaction to her Abilify x2 weeks ago.  No wheezing noted at this time.  Pt ambulatory to ED.

## 2013-01-12 NOTE — ED Notes (Signed)
50 mg Benadryl PO given en route, by EMS

## 2013-01-12 NOTE — ED Provider Notes (Signed)
History    CSN: [id number] Arrival date & time 01/12/13  0415  First MD Initiated Contact with Patient 01/12/13 [phone]     Chief Complaint  Patient presents with  . Allergic Reaction   (Consider location/radiation/quality/duration/timing/severity/associated sxs/prior Treatment) HPI  Stacy Pierce is a 42 y.o. female complaining of pruritic hives all over body starting 2 days ago. Patient recently started lithium, she had 2 doses on June 27 and developed the rash, she was instructed to DC the lithium and start taking Benadryl 50 mg every 4 hours and also Zyrtec daily she has been doing that S. distress as instructed for the last 2 days, but she states that the rash is not getting better additionally she had a single episode of nonbloody, nonbilious, no coffee ground emesis at 8 PM last night. Patient endorses a chest tightness and shortness of breath.  Past Medical History  Diagnosis Date  . Gallstones   . Depression   . Anxiety   . COPD (chronic obstructive pulmonary disease)    Past Surgical History  Procedure Laterality Date  . Cholecystectomy  11/07/2011  . Cholecystectomy  11/09/2011    Procedure: LAPAROSCOPIC CHOLECYSTECTOMY;  Surgeon: Lodema Pilot, DO;  Location: [facility] OR;  Service: General;  Laterality: N/A;  Lap. Chole. with IOC   No family history on file. History  Substance Use Topics  . Smoking status: Current Every Day Smoker -- 0.50 packs/day    Types: Cigarettes  . Smokeless tobacco: Never Used  . Alcohol Use: Yes   OB History   Grav Para Term Preterm Abortions TAB SAB Ect Mult Living                 Review of Systems  Constitutional:       Negative except as described in HPI  HENT:       Negative except as described in HPI  Respiratory:       Negative except as described in HPI  Cardiovascular:       Negative except as described in HPI  Gastrointestinal:       Negative except as described in HPI  Genitourinary:       Negative except as described in HPI   Musculoskeletal:       Negative except as described in HPI  Skin:       Negative except as described in HPI  Neurological:       Negative except as described in HPI  All other systems reviewed and are negative.    Allergies  Abilify; Lithium; and Amoxicillin  Home Medications   Current Outpatient Rx  Name  Route  Sig  Dispense  Refill  . ARIPiprazole (ABILIFY) 5 MG tablet   Oral   Take 5 mg by mouth daily.         Marland Kitchen buPROPion (WELLBUTRIN XL) 150 MG 24 hr tablet   Oral   Take 150 mg by mouth daily.         . hydrocortisone cream 1 %   Topical   Apply 1 application topically 2 (two) times daily.         Marland Kitchen ibuprofen (ADVIL,MOTRIN) 200 MG tablet   Oral   Take 400 mg by mouth every 6 (six) hours as needed for pain.         . predniSONE (DELTASONE) 20 MG tablet   Oral   Take 3 tablets (60 mg total) by mouth daily.   12 tablet   0  SpO2 98% Physical Exam  Nursing note and vitals reviewed. Constitutional: She is oriented to person, place, and time. She appears well-developed and well-nourished. No distress.  HENT:  Head: Normocephalic.  Mouth/Throat: Oropharynx is clear and moist.  No angioedema, posterior pharynx is not edematous.  Eyes: Conjunctivae and EOM are normal. Pupils are equal, round, and reactive to light.  Neck: Normal range of motion.  Cardiovascular: Normal rate.   Pulmonary/Chest: Effort normal and breath sounds normal. No stridor. No respiratory distress. She has no wheezes. She has no rales. She exhibits no tenderness.  No stridor, patient speaking in complete sentences, there is good air movement in all fields with no wheezing.   Abdominal: Soft. Bowel sounds are normal. She exhibits no distension and no mass. There is no tenderness. There is no rebound and no guarding.  Musculoskeletal: Normal range of motion.  Neurological: She is alert and oriented to person, place, and time.  Skin:  hive-like welts to all extremities, torso and face   Psychiatric: She has a normal mood and affect.    ED Course  Procedures (including critical care time) Labs Reviewed  LITHIUM LEVEL - Abnormal; Notable for the following:    Lithium Lvl <0.25 (*)    All other components within normal limits   No results found. 1. Allergic reaction caused by a drug     MDM   Filed Vitals:   01/12/13 0539  SpO2: 98%     Stacy Pierce is a 42 y.o. female with allergic urticaria likely secondary to lithium. Nonresponsive to by mouth Benadryl and Zyrtec. Patient is given Decadron and Pepcid via IV. There is significant improvement in rash and sensation of pruritus. We'll discharge her home on prednisone burst.   Medications  dexamethasone (DECADRON) injection 10 mg (not administered)  famotidine (PEPCID) IVPB 20 mg (not administered)  sodium chloride 0.9 % bolus 1,000 mL (not administered)  ondansetron (ZOFRAN-ODT) disintegrating tablet 4 mg (not administered)  albuterol (PROVENTIL) (5 MG/ML) 0.5% nebulizer solution 2.5 mg (not administered)    Pt is hemodynamically stable, appropriate for, and amenable to discharge at this time. Pt verbalized understanding and agrees with care plan. Outpatient follow-up and specific return precautions discussed.    New Prescriptions   FAMOTIDINE (PEPCID) 20 MG TABLET    Take 1 tablet (20 mg total) by mouth 2 (two) times daily.   PREDNISONE (DELTASONE) 20 MG TABLET    Take 2 tablets (40 mg total) by mouth daily.     Wynetta Emery, PA-C 01/13/13 [phone]

## 2013-01-12 NOTE — ED Notes (Signed)
RUE:AV40<JW> Expected date:01/12/13<BR> Expected time: 4:02 AM<BR> Means of arrival:Ambulance<BR> Comments:<BR> medication reaction

## 2013-01-18 NOTE — ED Provider Notes (Signed)
Medical screening examination/treatment/procedure(s) were performed by non-physician practitioner and as supervising physician I was immediately available for consultation/collaboration.  Mahki Spikes, MD 01/18/13 1549 

## 2013-02-02 ENCOUNTER — Other Ambulatory Visit (HOSPITAL_COMMUNITY): Payer: Self-pay | Admitting: Internal Medicine

## 2013-02-02 DIAGNOSIS — Z1231 Encounter for screening mammogram for malignant neoplasm of breast: Secondary | ICD-10-CM

## 2013-02-09 ENCOUNTER — Ambulatory Visit (HOSPITAL_COMMUNITY)
Admission: RE | Admit: 2013-02-09 | Discharge: 2013-02-09 | Disposition: A | Discharge status: Home / Self Care | Payer: Medicaid Other | Source: Ambulatory Visit | Attending: Internal Medicine | Admitting: Internal Medicine

## 2013-02-09 DIAGNOSIS — Z1231 Encounter for screening mammogram for malignant neoplasm of breast: Secondary | ICD-10-CM | POA: Insufficient documentation

## 2013-03-22 ENCOUNTER — Emergency Department (HOSPITAL_COMMUNITY)
Admission: EM | Admit: 2013-03-22 | Discharge: 2013-03-22 | Disposition: A | Discharge status: Home / Self Care | Payer: Medicaid Other | Attending: Emergency Medicine | Admitting: Emergency Medicine

## 2013-03-22 ENCOUNTER — Encounter (HOSPITAL_COMMUNITY): Payer: Self-pay

## 2013-03-22 DIAGNOSIS — T445X5A Adverse effect of predominantly beta-adrenoreceptor agonists, initial encounter: Secondary | ICD-10-CM

## 2013-03-22 DIAGNOSIS — T44905A Adverse effect of unspecified drugs primarily affecting the autonomic nervous system, initial encounter: Secondary | ICD-10-CM | POA: Insufficient documentation

## 2013-03-22 DIAGNOSIS — J4489 Other specified chronic obstructive pulmonary disease: Secondary | ICD-10-CM | POA: Insufficient documentation

## 2013-03-22 DIAGNOSIS — R6883 Chills (without fever): Secondary | ICD-10-CM | POA: Insufficient documentation

## 2013-03-22 DIAGNOSIS — Z8719 Personal history of other diseases of the digestive system: Secondary | ICD-10-CM | POA: Insufficient documentation

## 2013-03-22 DIAGNOSIS — F411 Generalized anxiety disorder: Secondary | ICD-10-CM | POA: Insufficient documentation

## 2013-03-22 DIAGNOSIS — J449 Chronic obstructive pulmonary disease, unspecified: Secondary | ICD-10-CM | POA: Insufficient documentation

## 2013-03-22 DIAGNOSIS — F17200 Nicotine dependence, unspecified, uncomplicated: Secondary | ICD-10-CM | POA: Insufficient documentation

## 2013-03-22 HISTORY — DX: Bipolar disorder, unspecified: F31.9

## 2013-03-22 NOTE — ED Notes (Signed)
Per EMS, they were called to Pt's dentist office after she began having chills and increased heart rate of 90 prior to a procedure.  Pt had been given Lidocaine w/ Epi.  EMS sts Pt was very anxious upon arrival and c/o dizziness after standing.  EMS sts dizziness resolved en route.  Pt, currently, c/o anxiety due to ED visit and germs.  Vitals are stable.  NAD noted.

## 2013-03-22 NOTE — ED Notes (Signed)
Bed: WA17 Expected date:  Expected time:  Means of arrival:  Comments: About to undergo dental tx, shivering, nauseous

## 2013-03-22 NOTE — ED Provider Notes (Addendum)
CSN: [id number]     Arrival date & time 03/22/13  1247 History   First MD Initiated Contact with Patient 03/22/13 1314     Chief Complaint  Patient presents with  . Chills  . Anxiety   (Consider location/radiation/quality/duration/timing/severity/associated sxs/prior Treatment) HPI Comments: Patient was at her dentist office today where she was going to be getting a deep tooth cleaning requiring her mouth to be numbed with lidocaine with epi. She was fine when she arrived at the dentist office and when she was in the exam room but after she received the injections she started to shake and had feelings of chills over her body and her heart rate increased from 108-144. She attempted to get up and walk to the waiting room and she felt very dizzy, lightheaded and nauseated. By the time she arrived in the emergency room her symptoms had resolved. She now has no complaints. During all of this she denied chest pain, shortness of breath, abdominal pain. She states because of prior history she is nervous around men and states she became more anxious when the female dentist came into the room. She has had panic attacks in the past but states this felt nothing like a panic attack  The history is provided by the patient.    Past Medical History  Diagnosis Date  . Gallstones   . Depression   . Anxiety   . COPD (chronic obstructive pulmonary disease)    Past Surgical History  Procedure Laterality Date  . Cholecystectomy  11/07/2011  . Cholecystectomy  11/09/2011    Procedure: LAPAROSCOPIC CHOLECYSTECTOMY;  Surgeon: Lodema Pilot, DO;  Location: [facility] OR;  Service: General;  Laterality: N/A;  Lap. Chole. with IOC   No family history on file. History  Substance Use Topics  . Smoking status: Current Every Day Smoker -- 0.50 packs/day    Types: Cigarettes  . Smokeless tobacco: Never Used  . Alcohol Use: Yes   OB History   Grav Para Term Preterm Abortions TAB SAB Ect Mult Living                 Review  of Systems  Respiratory: Negative for cough and shortness of breath.   Skin: Negative for rash.  All other systems reviewed and are negative.    Allergies  Abilify; Lithium; Wellbutrin; and Amoxicillin  Home Medications  No current outpatient prescriptions on file. BP 131/88  Pulse 77  Temp(Src) 98.5 F (36.9 C) (Oral)  Resp 18  SpO2 99% Physical Exam  Nursing note and vitals reviewed. Constitutional: She is oriented to person, place, and time. She appears well-developed and well-nourished. No distress.  Slightly anxious on exam  HENT:  Head: Normocephalic and atraumatic.  Mouth/Throat: Oropharynx is clear and moist.  Eyes: Conjunctivae and EOM are normal. Pupils are equal, round, and reactive to light.  Neck: Normal range of motion. Neck supple.  Cardiovascular: Normal rate, regular rhythm and intact distal pulses.   No murmur heard. Pulmonary/Chest: Effort normal and breath sounds normal. No respiratory distress. She has no wheezes. She has no rales.  Abdominal: Soft. She exhibits no distension. There is no tenderness. There is no rebound and no guarding.  Musculoskeletal: Normal range of motion. She exhibits no edema and no tenderness.  Neurological: She is alert and oriented to person, place, and time.  Skin: Skin is warm and dry. No rash noted. No erythema.  Psychiatric: She has a normal mood and affect. Her behavior is normal.    ED  Course  Procedures (including critical care time) Labs Review Labs Reviewed - No data to display Imaging Review No results found.  MDM   1. Epinephrine adverse reaction, initial encounter     Patient sent in from the dentist office today because of sudden onset of chills, racing heart and dizziness upon standing. This occurred after getting multiple lidocaine with epi injections in her mouth. She denied feeling anxious, chest pain or shortness of breath. She states she had no allergic reaction symptoms such as her throat closing up  swelling or hives or itching. Feel that patient most likely had some epi injected into an artery which caused her immediate symptoms. However her symptoms are now completely resolved she has no complaints at this time has normal vital signs and is able to ambulate without difficulty. Did not feel any need to keep the patient here longer and we'll discharge    Gwyneth Sprout, MD 03/22/13 1356  Gwyneth Sprout, MD 03/22/13 1357

## 2013-03-22 NOTE — ED Notes (Signed)
MD at bedside. 

## 2013-03-27 ENCOUNTER — Encounter: Payer: Self-pay | Admitting: *Deleted

## 2013-04-05 ENCOUNTER — Encounter: Payer: Medicaid Other | Admitting: Obstetrics & Gynecology

## 2013-04-25 ENCOUNTER — Emergency Department (HOSPITAL_COMMUNITY)
Admission: EM | Admit: 2013-04-25 | Discharge: 2013-04-25 | Disposition: A | Discharge status: Home / Self Care | Payer: Medicaid Other | Attending: Emergency Medicine | Admitting: Emergency Medicine

## 2013-04-25 ENCOUNTER — Encounter (HOSPITAL_COMMUNITY): Payer: Self-pay | Admitting: Emergency Medicine

## 2013-04-25 ENCOUNTER — Emergency Department (HOSPITAL_COMMUNITY): Payer: Medicaid Other

## 2013-04-25 DIAGNOSIS — IMO0002 Reserved for concepts with insufficient information to code with codable children: Secondary | ICD-10-CM | POA: Insufficient documentation

## 2013-04-25 DIAGNOSIS — F319 Bipolar disorder, unspecified: Secondary | ICD-10-CM | POA: Insufficient documentation

## 2013-04-25 DIAGNOSIS — Z79899 Other long term (current) drug therapy: Secondary | ICD-10-CM | POA: Insufficient documentation

## 2013-04-25 DIAGNOSIS — J4489 Other specified chronic obstructive pulmonary disease: Secondary | ICD-10-CM | POA: Insufficient documentation

## 2013-04-25 DIAGNOSIS — J449 Chronic obstructive pulmonary disease, unspecified: Secondary | ICD-10-CM | POA: Insufficient documentation

## 2013-04-25 DIAGNOSIS — F411 Generalized anxiety disorder: Secondary | ICD-10-CM | POA: Insufficient documentation

## 2013-04-25 DIAGNOSIS — Y929 Unspecified place or not applicable: Secondary | ICD-10-CM | POA: Insufficient documentation

## 2013-04-25 DIAGNOSIS — Z8719 Personal history of other diseases of the digestive system: Secondary | ICD-10-CM | POA: Insufficient documentation

## 2013-04-25 DIAGNOSIS — S46911A Strain of unspecified muscle, fascia and tendon at shoulder and upper arm level, right arm, initial encounter: Secondary | ICD-10-CM

## 2013-04-25 DIAGNOSIS — X500XXA Overexertion from strenuous movement or load, initial encounter: Secondary | ICD-10-CM | POA: Insufficient documentation

## 2013-04-25 DIAGNOSIS — Z881 Allergy status to other antibiotic agents status: Secondary | ICD-10-CM | POA: Insufficient documentation

## 2013-04-25 DIAGNOSIS — F17200 Nicotine dependence, unspecified, uncomplicated: Secondary | ICD-10-CM | POA: Insufficient documentation

## 2013-04-25 DIAGNOSIS — Y93E8 Activity, other personal hygiene: Secondary | ICD-10-CM | POA: Insufficient documentation

## 2013-04-25 DIAGNOSIS — Z888 Allergy status to other drugs, medicaments and biological substances status: Secondary | ICD-10-CM | POA: Insufficient documentation

## 2013-04-25 MED ORDER — IBUPROFEN 800 MG PO TABS: 800.0000 mg | ORAL_TABLET | Freq: Three times a day (TID) | ORAL | Status: DC | PRN

## 2013-04-25 NOTE — Discharge Instructions (Signed)
Your shoulder x-rays were normal.  Use ice and heat on your shoulder.  Return here as needed.  Followup with the orthopedist provided
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
## 2013-04-26 NOTE — ED Provider Notes (Signed)
Medical screening examination/treatment/procedure(s) were performed by non-physician practitioner and as supervising physician I was immediately available for consultation/collaboration.   Laray Anger, DO 04/26/13 2140

## 2013-08-14 ENCOUNTER — Ambulatory Visit (INDEPENDENT_AMBULATORY_CARE_PROVIDER_SITE_OTHER): Payer: Medicaid Other | Admitting: Obstetrics & Gynecology

## 2013-08-14 ENCOUNTER — Encounter: Payer: Self-pay | Admitting: Obstetrics & Gynecology

## 2013-08-14 ENCOUNTER — Other Ambulatory Visit (HOSPITAL_COMMUNITY)
Admission: RE | Admit: 2013-08-14 | Discharge: 2013-08-14 | Disposition: A | Discharge status: Home / Self Care | Payer: Medicaid Other | Source: Ambulatory Visit | Attending: Obstetrics & Gynecology | Admitting: Obstetrics & Gynecology

## 2013-08-14 VITALS — BP 147/83 | HR 86 | Ht 62.5 in | Wt 159.4 lb

## 2013-08-14 DIAGNOSIS — Z01419 Encounter for gynecological examination (general) (routine) without abnormal findings: Secondary | ICD-10-CM

## 2013-08-14 DIAGNOSIS — F411 Generalized anxiety disorder: Secondary | ICD-10-CM

## 2013-08-14 DIAGNOSIS — Z30433 Encounter for removal and reinsertion of intrauterine contraceptive device: Secondary | ICD-10-CM

## 2013-08-14 DIAGNOSIS — Z1151 Encounter for screening for human papillomavirus (HPV): Secondary | ICD-10-CM | POA: Insufficient documentation

## 2013-08-14 DIAGNOSIS — F419 Anxiety disorder, unspecified: Secondary | ICD-10-CM

## 2013-08-14 DIAGNOSIS — Z124 Encounter for screening for malignant neoplasm of cervix: Secondary | ICD-10-CM | POA: Insufficient documentation

## 2013-08-14 LAB — POCT PREGNANCY, URINE: Preg Test, Ur: NEGATIVE

## 2013-08-14 MED ORDER — LEVONORGESTREL 20 MCG/24HR IU IUD
INTRAUTERINE_SYSTEM | Freq: Once | INTRAUTERINE | Status: AC
  Administered 2013-08-14: 16:00:00 via INTRAUTERINE

## 2013-08-14 NOTE — Patient Instructions (Addendum)
Intrauterine Device Insertion, Care After Refer to this sheet in the next few weeks. These instructions provide you with information on caring for yourself after your procedure. Your health care provider may also give you more specific instructions. Your treatment has been planned according to current medical practices, but problems sometimes occur. Call your health care provider if you have any problems or questions after your procedure. WHAT TO EXPECT AFTER THE PROCEDURE Insertion of the IUD may cause some discomfort, such as cramping. The cramping should improve after the IUD is in place. You may have bleeding after the procedure. This is normal. It varies from light spotting for a few days to menstrual-like bleeding. When the IUD is in place, a string will extend past the cervix into the vagina for 1 2 inches. The strings should not bother you or your partner. If they do, talk to your health care provider.  HOME CARE INSTRUCTIONS   Check your intrauterine device (IUD) to make sure it is in place before you resume sexual activity. You should be able to feel the strings. If you cannot feel the strings, something may be wrong. The IUD may have fallen out of the uterus, or the uterus may have been punctured (perforated) during placement. Also, if the strings are getting longer, it may mean that the IUD is being forced out of the uterus. You no longer have full protection from pregnancy if any of these problems occur.  You may resume sexual intercourse if you are not having problems with the IUD. The copper IUD is considered immediately effective, and the hormone IUD works right away if inserted within 7 days of your period starting. You will need to use a backup method of birth control for 7 days if the IUD in inserted at any other time in your cycle.  Continue to check that the IUD is still in place by feeling for the strings after every menstrual period.  You may need to take pain medicine such as  acetaminophen or ibuprofen. Only take medicines as directed by your health care provider. SEEK MEDICAL CARE IF:   You have bleeding that is heavier or lasts longer than a normal menstrual cycle.  You have a fever.  You have increasing cramps or abdominal pain not relieved with medicine.  You have abdominal pain that does not seem to be related to the same area of earlier cramping and pain.  You are lightheaded, unusually weak, or faint.  You have abnormal vaginal discharge or smells.  You have pain during sexual intercourse.  You cannot feel the IUD strings, or the IUD string has gotten longer.  You feel the IUD at the opening of the cervix in the vagina.  You think you are pregnant, or you miss your menstrual period.  The IUD string is hurting your sex partner. MAKE SURE YOU:  Understand these instructions.  Will watch your condition.  Will get help right away if you are not doing well or get worse. Document Released: 02/25/2011 Document Revised: 04/19/2013 Document Reviewed: 12/18/2012 [facility] Patient Information 2014 [place].  Preventive Care for Adults, Female A healthy lifestyle and preventive care can promote health and wellness. Preventive health guidelines for women include the following key practices.  A routine yearly physical is a good way to check with your health care provider about your health and preventive screening. It is a chance to share any concerns and updates on your health and to receive a thorough exam.  Visit your dentist  for a routine exam and preventive care every 6 months. Brush your teeth twice a day and floss once a day. Good oral hygiene prevents tooth decay and gum disease.  The frequency of eye exams is based on your age, health, family medical history, use of contact lenses, and other factors. Follow your health care provider's recommendations for frequency of eye exams.  Eat a healthy diet. Foods like vegetables, fruits, whole  grains, low-fat dairy products, and lean protein foods contain the nutrients you need without too many calories. Decrease your intake of foods high in solid fats, added sugars, and salt. Eat the right amount of calories for you.Get information about a proper diet from your health care provider, if necessary.  Regular physical exercise is one of the most important things you can do for your health. Most adults should get at least 150 minutes of moderate-intensity exercise (any activity that increases your heart rate and causes you to sweat) each week. In addition, most adults need muscle-strengthening exercises on 2 or more days a week.  Maintain a healthy weight. The body mass index (BMI) is a screening tool to identify possible weight problems. It provides an estimate of body fat based on height and weight. Your health care provider can find your BMI, and can help you achieve or maintain a healthy weight.For adults 20 years and older:  A BMI below 18.5 is considered underweight.  A BMI of 18.5 to 24.9 is normal.  A BMI of 25 to 29.9 is considered overweight.  A BMI of 30 and above is considered obese.  Maintain normal blood lipids and cholesterol levels by exercising and minimizing your intake of saturated fat. Eat a balanced diet with plenty of fruit and vegetables. Blood tests for lipids and cholesterol should begin at age 31 and be repeated every 5 years. If your lipid or cholesterol levels are high, you are over 50, or you are at high risk for heart disease, you may need your cholesterol levels checked more frequently.Ongoing high lipid and cholesterol levels should be treated with medicines if diet and exercise are not working.  If you smoke, find out from your health care provider how to quit. If you do not use tobacco, do not start.  Lung cancer screening is recommended for adults aged 98 80 years who are at high risk for developing lung cancer because of a history of smoking. A yearly  low-dose CT scan of the lungs is recommended for people who have at least a 30-pack-year history of smoking and are a current smoker or have quit within the past 15 years. A pack year of smoking is smoking an average of 1 pack of cigarettes a day for 1 year (for example: 1 pack a day for 30 years or 2 packs a day for 15 years). Yearly screening should continue until the smoker has stopped smoking for at least 15 years. Yearly screening should be stopped for people who develop a health problem that would prevent them from having lung cancer treatment.  If you are pregnant, do not drink alcohol. If you are breastfeeding, be very cautious about drinking alcohol. If you are not pregnant and choose to drink alcohol, do not have more than 1 drink per day. One drink is considered to be 12 ounces (355 mL) of beer, 5 ounces (148 mL) of wine, or 1.5 ounces (44 mL) of liquor.  Avoid use of street drugs. Do not share needles with anyone. Ask for help if you need  support or instructions about stopping the use of drugs.  High blood pressure causes heart disease and increases the risk of stroke. Your blood pressure should be checked at least every 1 to 2 years. Ongoing high blood pressure should be treated with medicines if weight loss and exercise do not work.  If you are 40 45 years old, ask your health care provider if you should take aspirin to prevent strokes.  Diabetes screening involves taking a blood sample to check your fasting blood sugar level. This should be done once every 3 years, after age 50, if you are within normal weight and without risk factors for diabetes. Testing should be considered at a younger age or be carried out more frequently if you are overweight and have at least 1 risk factor for diabetes.  Breast cancer screening is essential preventive care for women. You should practice "breast self-awareness." This means understanding the normal appearance and feel of your breasts and may include  breast self-examination. Any changes detected, no matter how small, should be reported to a health care provider. Women in their 47s and 30s should have a clinical breast exam (CBE) by a health care provider as part of a regular health exam every 1 to 3 years. After age 45, women should have a CBE every year. Starting at age 71, women should consider having a mammogram (breast X-ray test) every year. Women who have a family history of breast cancer should talk to their health care provider about genetic screening. Women at a high risk of breast cancer should talk to their health care providers about having an MRI and a mammogram every year.  Breast cancer gene (BRCA)-related cancer risk assessment is recommended for women who have family members with BRCA-related cancers. BRCA-related cancers include breast, ovarian, tubal, and peritoneal cancers. Having family members with these cancers may be associated with an increased risk for harmful changes (mutations) in the breast cancer genes BRCA1 and BRCA2. Results of the assessment will determine the need for genetic counseling and BRCA1 and BRCA2 testing.  The Pap test is a screening test for cervical cancer. A Pap test can show cell changes on the cervix that might become cervical cancer if left untreated. A Pap test is a procedure in which cells are obtained and examined from the lower end of the uterus (cervix).  Women should have a Pap test starting at age 16.  Between ages 38 and 42, Pap tests should be repeated every 2 years.  Beginning at age 80, you should have a Pap test every 3 years as long as the past 3 Pap tests have been normal.  Some women have medical problems that increase the chance of getting cervical cancer. Talk to your health care provider about these problems. It is especially important to talk to your health care provider if a new problem develops soon after your last Pap test. In these cases, your health care provider may recommend  more frequent screening and Pap tests.  The above recommendations are the same for women who have or have not gotten the vaccine for human papillomavirus (HPV).  If you had a hysterectomy for a problem that was not cancer or a condition that could lead to cancer, then you no longer need Pap tests. Even if you no longer need a Pap test, a regular exam is a good idea to make sure no other problems are starting.  If you are between ages 41 and 19 years, and you have had  normal Pap tests going back 10 years, you no longer need Pap tests. Even if you no longer need a Pap test, a regular exam is a good idea to make sure no other problems are starting.  If you have had past treatment for cervical cancer or a condition that could lead to cancer, you need Pap tests and screening for cancer for at least 20 years after your treatment.  If Pap tests have been discontinued, risk factors (such as a new sexual partner) need to be reassessed to determine if screening should be resumed.  The HPV test is an additional test that may be used for cervical cancer screening. The HPV test looks for the virus that can cause the cell changes on the cervix. The cells collected during the Pap test can be tested for HPV. The HPV test could be used to screen women aged 29 years and older, and should be used in women of any age who have unclear Pap test results. After the age of 74, women should have HPV testing at the same frequency as a Pap test.  Colorectal cancer can be detected and often prevented. Most routine colorectal cancer screening begins at the age of 44 years and continues through age 64 years. However, your health care provider may recommend screening at an earlier age if you have risk factors for colon cancer. On a yearly basis, your health care provider may provide home test kits to check for hidden blood in the stool. Use of a small camera at the end of a tube, to directly examine the colon (sigmoidoscopy or  colonoscopy), can detect the earliest forms of colorectal cancer. Talk to your health care provider about this at age 71, when routine screening begins. Direct exam of the colon should be repeated every 5 10 years through age 57 years, unless early forms of pre-cancerous polyps or small growths are found.  People who are at an increased risk for hepatitis B should be screened for this virus. You are considered at high risk for hepatitis B if:  You were born in a country where hepatitis B occurs often. Talk with your health care provider about which countries are considered high risk.  Your parents were born in a high-risk country and you have not received a shot to protect against hepatitis B (hepatitis B vaccine).  You have HIV or AIDS.  You use needles to inject street drugs.  You live with, or have sex with, someone who has Hepatitis B.  You get hemodialysis treatment.  You take certain medicines for conditions like cancer, organ transplantation, and autoimmune conditions.  Hepatitis C blood testing is recommended for all people born from [birth year] through 1967 and any individual with known risks for hepatitis C.  Practice safe sex. Use condoms and avoid high-risk sexual practices to reduce the spread of sexually transmitted infections (STIs). STIs include gonorrhea, chlamydia, syphilis, trichomonas, herpes, HPV, and human immunodeficiency virus (HIV). Herpes, HIV, and HPV are viral illnesses that have no cure. They can result in disability, cancer, and death. Sexually active women aged 21 years and younger should be checked for chlamydia. Older women with new or multiple partners should also be tested for chlamydia. Testing for other STIs is recommended if you are sexually active and at increased risk.  Osteoporosis is a disease in which the bones lose minerals and strength with aging. This can result in serious bone fractures or breaks. The risk of osteoporosis can be identified using a bone  density scan. Women ages 68 years and over and women at risk for fractures or osteoporosis should discuss screening with their health care providers. Ask your health care provider whether you should take a calcium supplement or vitamin D to reduce the rate of osteoporosis.  Menopause can be associated with physical symptoms and risks. Hormone replacement therapy is available to decrease symptoms and risks. You should talk to your health care provider about whether hormone replacement therapy is right for you.  Use sunscreen. Apply sunscreen liberally and repeatedly throughout the day. You should seek shade when your shadow is shorter than you. Protect yourself by wearing long sleeves, pants, a wide-brimmed hat, and sunglasses year round, whenever you are outdoors.  Once a month, do a whole body skin exam, using a mirror to look at the skin on your back. Tell your health care provider of new moles, moles that have irregular borders, moles that are larger than a pencil eraser, or moles that have changed in shape or color.  Stay current with required vaccines (immunizations).  Influenza vaccine. All adults should be immunized every year.  Tetanus, diphtheria, and acellular pertussis (Td, Tdap) vaccine. Pregnant women should receive 1 dose of Tdap vaccine during each pregnancy. The dose should be obtained regardless of the length of time since the last dose. Immunization is preferred during the 27th 36th week of gestation. An adult who has not previously received Tdap or who does not know her vaccine status should receive 1 dose of Tdap. This initial dose should be followed by tetanus and diphtheria toxoids (Td) booster doses every 10 years. Adults with an unknown or incomplete history of completing a 3-dose immunization series with Td-containing vaccines should begin or complete a primary immunization series including a Tdap dose. Adults should receive a Td booster every 10 years.  Varicella vaccine. An  adult without evidence of immunity to varicella should receive 2 doses or a second dose if she has previously received 1 dose. Pregnant females who do not have evidence of immunity should receive the first dose after pregnancy. This first dose should be obtained before leaving the health care facility. The second dose should be obtained 4 8 weeks after the first dose.  Human papillomavirus (HPV) vaccine. Females aged 13 26 years who have not received the vaccine previously should obtain the 3-dose series. The vaccine is not recommended for use in pregnant females. However, pregnancy testing is not needed before receiving a dose. If a female is found to be pregnant after receiving a dose, no treatment is needed. In that case, the remaining doses should be delayed until after the pregnancy. Immunization is recommended for any person with an immunocompromised condition through the age of 90 years if she did not get any or all doses earlier. During the 3-dose series, the second dose should be obtained 4 8 weeks after the first dose. The third dose should be obtained 24 weeks after the first dose and 16 weeks after the second dose.  Zoster vaccine. One dose is recommended for adults aged 73 years or older unless certain conditions are present.  Measles, mumps, and rubella (MMR) vaccine. Adults born before [birth year] generally are considered immune to measles and mumps. Adults born in [birth year] or later should have 1 or more doses of MMR vaccine unless there is a contraindication to the vaccine or there is laboratory evidence of immunity to each of the three diseases. A routine second dose of MMR vaccine should be obtained at least 28  days after the first dose for students attending postsecondary schools, health care workers, or international travelers. People who received inactivated measles vaccine or an unknown type of measles vaccine during 1963 1967 should receive 2 doses of MMR vaccine. People who received inactivated  mumps vaccine or an unknown type of mumps vaccine before 1979 and are at high risk for mumps infection should consider immunization with 2 doses of MMR vaccine. For females of childbearing age, rubella immunity should be determined. If there is no evidence of immunity, females who are not pregnant should be vaccinated. If there is no evidence of immunity, females who are pregnant should delay immunization until after pregnancy. Unvaccinated health care workers born before 2 who lack laboratory evidence of measles, mumps, or rubella immunity or laboratory confirmation of disease should consider measles and mumps immunization with 2 doses of MMR vaccine or rubella immunization with 1 dose of MMR vaccine.  Pneumococcal 13-valent conjugate (PCV13) vaccine. When indicated, a person who is uncertain of her immunization history and has no record of immunization should receive the PCV13 vaccine. An adult aged 81 years or older who has certain medical conditions and has not been previously immunized should receive 1 dose of PCV13 vaccine. This PCV13 should be followed with a dose of pneumococcal polysaccharide (PPSV23) vaccine. The PPSV23 vaccine dose should be obtained at least 8 weeks after the dose of PCV13 vaccine. An adult aged 67 years or older who has certain medical conditions and previously received 1 or more doses of PPSV23 vaccine should receive 1 dose of PCV13. The PCV13 vaccine dose should be obtained 1 or more years after the last PPSV23 vaccine dose.  Pneumococcal polysaccharide (PPSV23) vaccine. When PCV13 is also indicated, PCV13 should be obtained first. All adults aged 33 years and older should be immunized. An adult younger than age 53 years who has certain medical conditions should be immunized. Any person who resides in a nursing home or long-term care facility should be immunized. An adult smoker should be immunized. People with an immunocompromised condition and certain other conditions should  receive both PCV13 and PPSV23 vaccines. People with human immunodeficiency virus (HIV) infection should be immunized as soon as possible after diagnosis. Immunization during chemotherapy or radiation therapy should be avoided. Routine use of PPSV23 vaccine is not recommended for American Indians, [place] Natives, or people younger than 65 years unless there are medical conditions that require PPSV23 vaccine. When indicated, people who have unknown immunization and have no record of immunization should receive PPSV23 vaccine. One-time revaccination 5 years after the first dose of PPSV23 is recommended for people aged 36 64 years who have chronic kidney failure, nephrotic syndrome, asplenia, or immunocompromised conditions. People who received 1 2 doses of PPSV23 before age 14 years should receive another dose of PPSV23 vaccine at age 7 years or later if at least 5 years have passed since the previous dose. Doses of PPSV23 are not needed for people immunized with PPSV23 at or after age 30 years.  Meningococcal vaccine. Adults with asplenia or persistent complement component deficiencies should receive 2 doses of quadrivalent meningococcal conjugate (MenACWY-D) vaccine. The doses should be obtained at least 2 months apart. Microbiologists working with certain meningococcal bacteria, [employer] recruits, people at risk during an outbreak, and people who travel to or live in countries with a high rate of meningitis should be immunized. A first-year college student up through age 68 years who is living in a residence hall should receive a dose if she  did not receive a dose on or after her 16th birthday. Adults who have certain high-risk conditions should receive one or more doses of vaccine.  Hepatitis A vaccine. Adults who wish to be protected from this disease, have certain high-risk conditions, work with hepatitis A-infected animals, work in hepatitis A research labs, or travel to or work in countries with a high  rate of hepatitis A should be immunized. Adults who were previously unvaccinated and who anticipate close contact with an international adoptee during the first 60 days after arrival in the [place] from a country with a high rate of hepatitis A should be immunized.  Hepatitis B vaccine. Adults who wish to be protected from this disease, have certain high-risk conditions, may be exposed to blood or other infectious body fluids, are household contacts or sex partners of hepatitis B positive people, are clients or workers in certain care facilities, or travel to or work in countries with a high rate of hepatitis B should be immunized.  Haemophilus influenzae type b (Hib) vaccine. A previously unvaccinated person with asplenia or sickle cell disease or having a scheduled splenectomy should receive 1 dose of Hib vaccine. Regardless of previous immunization, a recipient of a hematopoietic stem cell transplant should receive a 3-dose series 6 12 months after her successful transplant. Hib vaccine is not recommended for adults with HIV infection. Preventive Services / Frequency Ages 67 to 39years  Blood pressure check.** / Every 1 to 2 years.  Lipid and cholesterol check.** / Every 5 years beginning at age 26.  Clinical breast exam.** / Every 3 years for women in their 67s and 13s.  BRCA-related cancer risk assessment.** / For women who have family members with a BRCA-related cancer (breast, ovarian, tubal, or peritoneal cancers).  Pap test.** / Every 2 years from ages 67 through 75. Every 3 years starting at age 59 through age 41 or 63 with a history of 3 consecutive normal Pap tests.  HPV screening.** / Every 3 years from ages 8 through ages 30 to 92 with a history of 3 consecutive normal Pap tests.  Hepatitis C blood test.** / For any individual with known risks for hepatitis C.  Skin self-exam. / Monthly.  Influenza vaccine. / Every year.  Tetanus, diphtheria, and acellular pertussis  (Tdap, Td) vaccine.** / Consult your health care provider. Pregnant women should receive 1 dose of Tdap vaccine during each pregnancy. 1 dose of Td every 10 years.  Varicella vaccine.** / Consult your health care provider. Pregnant females who do not have evidence of immunity should receive the first dose after pregnancy.  HPV vaccine. / 3 doses over 6 months, if 39 and younger. The vaccine is not recommended for use in pregnant females. However, pregnancy testing is not needed before receiving a dose.  Measles, mumps, rubella (MMR) vaccine.** / You need at least 1 dose of MMR if you were born in 1957 or later. You may also need a 2nd dose. For females of childbearing age, rubella immunity should be determined. If there is no evidence of immunity, females who are not pregnant should be vaccinated. If there is no evidence of immunity, females who are pregnant should delay immunization until after pregnancy.  Pneumococcal 13-valent conjugate (PCV13) vaccine.** / Consult your health care provider.  Pneumococcal polysaccharide (PPSV23) vaccine.** / 1 to 2 doses if you smoke cigarettes or if you have certain conditions.  Meningococcal vaccine.** / 1 dose if you are age 87 to 5 years and a Advertising account [employer]  college student living in a residence hall, or have one of several medical conditions, you need to get vaccinated against meningococcal disease. You may also need additional booster doses.  Hepatitis A vaccine.** / Consult your health care provider.  Hepatitis B vaccine.** / Consult your health care provider.  Haemophilus influenzae type b (Hib) vaccine.** / Consult your health care provider. Ages 26 to 64years  Blood pressure check.** / Every 1 to 2 years.  Lipid and cholesterol check.** / Every 5 years beginning at age 60 years.  Lung cancer screening. / Every year if you are aged 56 80 years and have a 30-pack-year history of smoking and currently smoke or have quit within the past 15 years.  Yearly screening is stopped once you have quit smoking for at least 15 years or develop a health problem that would prevent you from having lung cancer treatment.  Clinical breast exam.** / Every year after age 29 years.  BRCA-related cancer risk assessment.** / For women who have family members with a BRCA-related cancer (breast, ovarian, tubal, or peritoneal cancers).  Mammogram.** / Every year beginning at age 73 years and continuing for as long as you are in good health. Consult with your health care provider.  Pap test.** / Every 3 years starting at age 65 years through age 64 or 55 years with a history of 3 consecutive normal Pap tests.  HPV screening.** / Every 3 years from ages 75 years through ages 43 to 61 years with a history of 3 consecutive normal Pap tests.  Fecal occult blood test (FOBT) of stool. / Every year beginning at age 86 years and continuing until age 59 years. You may not need to do this test if you get a colonoscopy every 10 years.  Flexible sigmoidoscopy or colonoscopy.** / Every 5 years for a flexible sigmoidoscopy or every 10 years for a colonoscopy beginning at age 37 years and continuing until age 16 years.  Hepatitis C blood test.** / For all people born from [birth year] through 1967 and any individual with known risks for hepatitis C.  Skin self-exam. / Monthly.  Influenza vaccine. / Every year.  Tetanus, diphtheria, and acellular pertussis (Tdap/Td) vaccine.** / Consult your health care provider. Pregnant women should receive 1 dose of Tdap vaccine during each pregnancy. 1 dose of Td every 10 years.  Varicella vaccine.** / Consult your health care provider. Pregnant females who do not have evidence of immunity should receive the first dose after pregnancy.  Zoster vaccine.** / 1 dose for adults aged 26 years or older.  Measles, mumps, rubella (MMR) vaccine.** / You need at least 1 dose of MMR if you were born in 1957 or later. You may also need a 2nd dose. For  females of childbearing age, rubella immunity should be determined. If there is no evidence of immunity, females who are not pregnant should be vaccinated. If there is no evidence of immunity, females who are pregnant should delay immunization until after pregnancy.  Pneumococcal 13-valent conjugate (PCV13) vaccine.** / Consult your health care provider.  Pneumococcal polysaccharide (PPSV23) vaccine.** / 1 to 2 doses if you smoke cigarettes or if you have certain conditions.  Meningococcal vaccine.** / Consult your health care provider.  Hepatitis A vaccine.** / Consult your health care provider.  Hepatitis B vaccine.** / Consult your health care provider.  Haemophilus influenzae type b (Hib) vaccine.** / Consult your health care provider. Ages 12 years and over  Blood pressure check.** / Every 1 to 2 years.  Lipid and cholesterol check.** / Every 5 years beginning at age 8 years.  Lung cancer screening. / Every year if you are aged 62 80 years and have a 30-pack-year history of smoking and currently smoke or have quit within the past 15 years. Yearly screening is stopped once you have quit smoking for at least 15 years or develop a health problem that would prevent you from having lung cancer treatment.  Clinical breast exam.** / Every year after age 70 years.  BRCA-related cancer risk assessment.** / For women who have family members with a BRCA-related cancer (breast, ovarian, tubal, or peritoneal cancers).  Mammogram.** / Every year beginning at age 3 years and continuing for as long as you are in good health. Consult with your health care provider.  Pap test.** / Every 3 years starting at age 79 years through age 38 or 18 years with 3 consecutive normal Pap tests. Testing can be stopped between 65 and 70 years with 3 consecutive normal Pap tests and no abnormal Pap or HPV tests in the past 10 years.  HPV screening.** / Every 3 years from ages 51 years through ages 78 or 34 years  with a history of 3 consecutive normal Pap tests. Testing can be stopped between 65 and 70 years with 3 consecutive normal Pap tests and no abnormal Pap or HPV tests in the past 10 years.  Fecal occult blood test (FOBT) of stool. / Every year beginning at age 27 years and continuing until age 31 years. You may not need to do this test if you get a colonoscopy every 10 years.  Flexible sigmoidoscopy or colonoscopy.** / Every 5 years for a flexible sigmoidoscopy or every 10 years for a colonoscopy beginning at age 13 years and continuing until age 28 years.  Hepatitis C blood test.** / For all people born from [birth year] through 1967 and any individual with known risks for hepatitis C.  Osteoporosis screening.** / A one-time screening for women ages 35 years and over and women at risk for fractures or osteoporosis.  Skin self-exam. / Monthly.  Influenza vaccine. / Every year.  Tetanus, diphtheria, and acellular pertussis (Tdap/Td) vaccine.** / 1 dose of Td every 10 years.  Varicella vaccine.** / Consult your health care provider.  Zoster vaccine.** / 1 dose for adults aged 26 years or older.  Pneumococcal 13-valent conjugate (PCV13) vaccine.** / Consult your health care provider.  Pneumococcal polysaccharide (PPSV23) vaccine.** / 1 dose for all adults aged 38 years and older.  Meningococcal vaccine.** / Consult your health care provider.  Hepatitis A vaccine.** / Consult your health care provider.  Hepatitis B vaccine.** / Consult your health care provider.  Haemophilus influenzae type b (Hib) vaccine.** / Consult your health care provider. ** Family history and personal history of risk and conditions may change your health care provider's recommendations. Document Released: 08/25/2001 Document Revised: 04/19/2013 Document Reviewed: 11/24/2010 [facility] Patient Information 2014 [place], [place].  Thank you for enrolling in [place]. Please follow the instructions below to securely access your  online medical record. MyChart allows you to send messages to your doctor, view your test results, manage appointments, and more.   How Do I Sign Up? 1. In your Internet browser, go to [employer] and enter [email or web address]. 2. Click on the Sign Up Now link in the Sign In box. You will see the New Member Sign Up page. 3. Enter your MyChart Access Code exactly as it appears below. You will not need to use this  code after you've completed the sign-up process. If you do not sign up before the expiration date, you must request a new code.  MyChart Access Code: [email or web address] Expires: 10/13/2013  2:37 PM  4. Enter your Social Security Number ([email or web address]) and Date of Birth (mm/dd/yyyy) as indicated and click Submit. You will be taken to the next sign-up page. 5. Create a MyChart ID. This will be your MyChart login ID and cannot be changed, so think of one that is secure and easy to remember. 6. Create a MyChart password. You can change your password at any time. 7. Enter your Password Reset Question and Answer. This can be used at a later time if you forget your password.  8. Enter your e-mail address. You will receive e-mail notification when new information is available in [place]. 9. Click Sign Up. You can now view your medical record.   Additional Information Remember, MyChart is NOT to be used for urgent needs. For medical emergencies, dial 911.

## 2013-08-14 NOTE — Progress Notes (Signed)
    GYNECOLOGY CLINIC ANNUAL PREVENTATIVE CARE ENCOUNTER NOTE  Subjective:     Stacy Pierce is a 43 y.o. [id number] female here for a routine annual gynecologic exam.  She has a Mirena IUD in place for seven years, she wants this removed and replaced.  No other GYN concerns.   Obstetric & Gynecologic History G5P5, SVD x 5 No LMP recorded. Patient is not currently having periods (Reason: IUD). Contraception: IUD Last Pap: normal, over a year ago. Results were: normal Last mammogram: 02/10/13. Results were: normal  The following portions of the patient's history were reviewed and updated as appropriate: allergies, current medications, past family history, past medical history, past social history, past surgical history and problem list.  Review of Systems A comprehensive review of systems was negative.    Objective:   BP 147/83  Pulse 86  Ht 5' 2.5" (1.588 m)  Wt 159 lb 6.4 oz (72.303 kg)  BMI 28.67 kg/m2 GENERAL: Well-developed, well-nourished female in no acute distress.  HEENT: Normocephalic, atraumatic. Sclerae anicteric.  NECK: Supple. Normal thyroid.  LUNGS: Clear to auscultation bilaterally.  HEART: Regular rate and rhythm. BREASTS: Symmetric in size. No masses, skin changes, nipple drainage, or lymphadenopathy. ABDOMEN: Soft, nontender, nondistended. No organomegaly. PELVIC: Normal external female genitalia. Vagina is pink and rugated.  Normal discharge. Normal cervix contour. IUD strings seen. Pap smear obtained. Uterus is normal in size. No adnexal mass or tenderness.  EXTREMITIES: No cyanosis, clubbing, or edema, 2+ distal pulses.  IUD Removal and Reinsertion  Patient identified, informed consent performed. Discussed risks of irregular bleeding, cramping, infection, malpositioning or misplacement of the IUD outside the uterus which may require further procedures. Also advised to use backup contraception for one week as the risk of pregnancy is higher during the transition  period of removing an IUD and replacing it with another one. Time out was performed. Speculum placed in the vagina. The strings of the IUD were grasped and pulled using ring forceps. The IUD was successfully removed in its entirety. The cervix was cleaned with Betadine x 2 and grasped anteriorly with a single tooth tenaculum.  The new Mirena IUD insertion apparatus was used to sound the uterus to 8 cm;  the IUD was then placed per manufacturer's recommendations. Strings trimmed to 3 cm. Tenaculum was removed, good hemostasis noted. Patient tolerated procedure well.   Patient was given post-procedure instructions.  She was reminded to have backup contraception for one week during this transition period between IUDs.  Patient was also asked to check IUD strings periodically and follow up in 4 weeks for IUD check.   Assessment:   Healthy female exam Mirena IUD removal and reinsertion    Plan:   Pap done, will follow up results and manage accordingly. Return in 4 weeks for IUD check Routine preventative health maintenance measures emphasized   Jaynie CollinsUGONNA  Saleemah Mollenhauer, MD, FACOG Attending Obstetrician & Gynecologist Faculty Practice, [facility]

## 2013-08-15 ENCOUNTER — Encounter: Payer: Self-pay | Admitting: Obstetrics & Gynecology

## 2013-08-15 DIAGNOSIS — R87610 Atypical squamous cells of undetermined significance on cytologic smear of cervix (ASC-US): Secondary | ICD-10-CM | POA: Insufficient documentation

## 2013-08-21 ENCOUNTER — Encounter: Payer: Self-pay | Admitting: *Deleted

## 2013-09-14 ENCOUNTER — Ambulatory Visit: Payer: Medicaid Other | Admitting: Obstetrics & Gynecology

## 2013-09-14 ENCOUNTER — Telehealth: Payer: Self-pay | Admitting: *Deleted

## 2013-09-14 ENCOUNTER — Encounter: Payer: Self-pay | Admitting: *Deleted

## 2013-09-14 NOTE — Telephone Encounter (Signed)
Called patient because she missed her appt today. Heard message that her phone is not in service. Will send letter.

## 2013-10-16 ENCOUNTER — Emergency Department (HOSPITAL_COMMUNITY)
Admission: EM | Admit: 2013-10-16 | Discharge: 2013-10-16 | Disposition: A | Discharge status: Home / Self Care | Payer: Medicaid Other | Attending: Emergency Medicine | Admitting: Emergency Medicine

## 2013-10-16 ENCOUNTER — Encounter (HOSPITAL_COMMUNITY): Payer: Self-pay | Admitting: Emergency Medicine

## 2013-10-16 DIAGNOSIS — F411 Generalized anxiety disorder: Secondary | ICD-10-CM | POA: Insufficient documentation

## 2013-10-16 DIAGNOSIS — Z8719 Personal history of other diseases of the digestive system: Secondary | ICD-10-CM | POA: Insufficient documentation

## 2013-10-16 DIAGNOSIS — F17200 Nicotine dependence, unspecified, uncomplicated: Secondary | ICD-10-CM | POA: Insufficient documentation

## 2013-10-16 DIAGNOSIS — F329 Major depressive disorder, single episode, unspecified: Secondary | ICD-10-CM | POA: Insufficient documentation

## 2013-10-16 DIAGNOSIS — Z880 Allergy status to penicillin: Secondary | ICD-10-CM | POA: Insufficient documentation

## 2013-10-16 DIAGNOSIS — Z79899 Other long term (current) drug therapy: Secondary | ICD-10-CM | POA: Insufficient documentation

## 2013-10-16 DIAGNOSIS — J029 Acute pharyngitis, unspecified: Secondary | ICD-10-CM

## 2013-10-16 DIAGNOSIS — F3289 Other specified depressive episodes: Secondary | ICD-10-CM | POA: Insufficient documentation

## 2013-10-16 DIAGNOSIS — J4489 Other specified chronic obstructive pulmonary disease: Secondary | ICD-10-CM | POA: Insufficient documentation

## 2013-10-16 DIAGNOSIS — J449 Chronic obstructive pulmonary disease, unspecified: Secondary | ICD-10-CM | POA: Insufficient documentation

## 2013-10-16 LAB — RAPID STREP SCREEN (MED CTR MEBANE ONLY): Streptococcus, Group A Screen (Direct): NEGATIVE

## 2013-10-16 NOTE — ED Notes (Signed)
Patient is alert and orientedx4.  Patient was explained discharge instructions and they understood them with no questions.   

## 2013-10-16 NOTE — ED Provider Notes (Signed)
CSN: [id number]     Arrival date & time 10/16/13  0631 History   First MD Initiated Contact with Patient 10/16/13 [phone]     Chief Complaint  Patient presents with  . Sore Throat     (Consider location/radiation/quality/duration/timing/severity/associated sxs/prior Treatment) Patient is a 43 y.o. female presenting with pharyngitis. The history is provided by the patient and medical records.  Sore Throat Associated symptoms include a sore throat.   This is a 43 year old female with past medical history significant for bipolar disorder, COPD, anxiety, depression, presenting to the ED via EMS for sore throat. Patient states the past 3-4 days her throat has felt irritated, worse with swallowing or try to smoke a cigarette. Patient states she thinks she had a fever yesterday because she felt warm, but did not check her temperature. She denies sick contacts at home. No difficulty swallowing or speaking.  No cough, cold, congestion, or other URI sx.  No chest pain or SOB.  VS stable on arrival.  Past Medical History  Diagnosis Date  . Gallstones   . Depression   . Anxiety   . COPD (chronic obstructive pulmonary disease)   . Bipolar 1 disorder    Past Surgical History  Procedure Laterality Date  . Cholecystectomy  11/07/2011  . Cholecystectomy  11/09/2011    Procedure: LAPAROSCOPIC CHOLECYSTECTOMY;  Surgeon: Lodema PilotBrian Layton, DO;  Location: [facility] OR;  Service: General;  Laterality: N/A;  Lap. Chole. with IOC   No family history on file. History  Substance Use Topics  . Smoking status: Current Every Day Smoker -- 0.50 packs/day    Types: Cigarettes  . Smokeless tobacco: Never Used  . Alcohol Use: Yes   OB History   Grav Para Term Preterm Abortions TAB SAB Ect Mult Living   5 5 5       5      Review of Systems  HENT: Positive for sore throat.   All other systems reviewed and are negative.      Allergies  Abilify; Lithium; Amoxicillin; and Wellbutrin  Home Medications   Current  Outpatient Rx  Name  Route  Sig  Dispense  Refill  . albuterol (PROAIR HFA) 108 (90 BASE) MCG/ACT inhaler   Inhalation   Inhale 2 puffs into the lungs every 6 (six) hours as needed for wheezing (wheezing).         . clonazePAM (KLONOPIN) 0.5 MG tablet   Oral   Take 0.5 mg by mouth 2 (two) times daily as needed for anxiety.         Marland Kitchen. doxepin (SINEQUAN) 25 MG capsule   Oral   Take 25 mg by mouth 3 (three) times daily.         Marland Kitchen. ibuprofen (ADVIL,MOTRIN) 200 MG tablet   Oral   Take 200 mg by mouth every 8 (eight) hours as needed for pain (pain).         Marland Kitchen. ibuprofen (ADVIL,MOTRIN) 800 MG tablet   Oral   Take 1 tablet (800 mg total) by mouth every 8 (eight) hours as needed for pain.   30 tablet   0    BP 124/85  Pulse 106  Temp(Src) 98.4 F (36.9 C) (Oral)  Resp 20  Ht 5\' 3"  (1.6 m)  Wt 158 lb (71.668 kg)  BMI 28.00 kg/m2  SpO2 100%  Physical Exam  Nursing note and vitals reviewed. Constitutional: She is oriented to person, place, and time. She appears well-developed and well-nourished. No distress.  HENT:  Head:  Normocephalic and atraumatic.  Right Ear: Tympanic membrane and ear canal normal.  Left Ear: Tympanic membrane and ear canal normal.  Nose: Nose normal.  Mouth/Throat: Uvula is midline, oropharynx is clear and moist and mucous membranes are normal. No trismus in the jaw. No uvula swelling. No oropharyngeal exudate, posterior oropharyngeal edema, posterior oropharyngeal erythema or tonsillar abscesses.  Tonsils normal in appearance bilaterally without exudate; uvula midline; no peritonsillar abscess; handling secretions appropriately; no difficulty swallowing or speaking; small chunks of potato chips present on tongue  Eyes: Conjunctivae and EOM are normal. Pupils are equal, round, and reactive to light.  Neck: Normal range of motion. Neck supple.  Cardiovascular: Normal rate, regular rhythm and normal heart sounds.   Pulmonary/Chest: Effort normal and breath  sounds normal. No respiratory distress. She has no wheezes.  Musculoskeletal: Normal range of motion.  Lymphadenopathy:    She has no cervical adenopathy.  Neurological: She is alert and oriented to person, place, and time.  Skin: Skin is warm and dry. She is not diaphoretic.  Psychiatric: She has a normal mood and affect.    ED Course  Procedures (including critical care time) Labs Review Labs Reviewed  RAPID STREP SCREEN  CULTURE, GROUP A STREP   Imaging Review No results found.   EKG Interpretation None      MDM   Final diagnoses:  Pharyngitis   Rapid strep negative, culture pending. Patient is afebrile and overall nontoxic appearing.  She has been able to tolerate PO in the ED without difficulty swallowing.  She'll be discharged home. Advised she may use over-the-counter Chloraseptic spray and/or salt water gargles to help with the sore throat.  Tylenol/motrin PRN fever.  Discussed plan with pt, she acknowledged understanding and agreed with plan of care.  Return precautions given.  Garlon Hatchet, PA-C 10/16/13 [phone]

## 2013-10-16 NOTE — Discharge Instructions (Signed)
Your rapid strep test was negative today.  It has been sent for culture and if abnormal you will be contacted. May use over the counter chloraseptic spray and/or salt water gargles to help with sore throat. May taken tylenol/motrin as needed for fever. Return to the ED for new concerns.

## 2013-10-16 NOTE — ED Notes (Signed)
Introduced myself to the patient, she was comfortable not complaints.

## 2013-10-16 NOTE — ED Notes (Signed)
The pt has had a sore throat for 3-4 days.  She has seen her doctor and was given meds for reflux in the past few days.  Elevated temp yewterday

## 2013-10-16 NOTE — ED Notes (Signed)
Pt came in by [facility]

## 2013-10-17 ENCOUNTER — Inpatient Hospital Stay (HOSPITAL_COMMUNITY)
Admission: EM | Admit: 2013-10-17 | Discharge: 2013-10-26 | DRG: 637 | Disposition: A | Discharge status: Home / Self Care | Payer: Medicaid Other | Attending: Internal Medicine | Admitting: Internal Medicine

## 2013-10-17 ENCOUNTER — Emergency Department (HOSPITAL_COMMUNITY): Payer: Medicaid Other

## 2013-10-17 DIAGNOSIS — I498 Other specified cardiac arrhythmias: Secondary | ICD-10-CM | POA: Diagnosis present

## 2013-10-17 DIAGNOSIS — R5381 Other malaise: Secondary | ICD-10-CM

## 2013-10-17 DIAGNOSIS — R509 Fever, unspecified: Secondary | ICD-10-CM

## 2013-10-17 DIAGNOSIS — F319 Bipolar disorder, unspecified: Secondary | ICD-10-CM | POA: Diagnosis present

## 2013-10-17 DIAGNOSIS — M79609 Pain in unspecified limb: Secondary | ICD-10-CM | POA: Diagnosis not present

## 2013-10-17 DIAGNOSIS — E1010 Type 1 diabetes mellitus with ketoacidosis without coma: Principal | ICD-10-CM | POA: Diagnosis present

## 2013-10-17 DIAGNOSIS — R6510 Systemic inflammatory response syndrome (SIRS) of non-infectious origin without acute organ dysfunction: Secondary | ICD-10-CM | POA: Diagnosis not present

## 2013-10-17 DIAGNOSIS — R05 Cough: Secondary | ICD-10-CM

## 2013-10-17 DIAGNOSIS — Z79899 Other long term (current) drug therapy: Secondary | ICD-10-CM

## 2013-10-17 DIAGNOSIS — F3289 Other specified depressive episodes: Secondary | ICD-10-CM | POA: Diagnosis present

## 2013-10-17 DIAGNOSIS — R259 Unspecified abnormal involuntary movements: Secondary | ICD-10-CM | POA: Diagnosis not present

## 2013-10-17 DIAGNOSIS — R1013 Epigastric pain: Secondary | ICD-10-CM | POA: Diagnosis present

## 2013-10-17 DIAGNOSIS — N390 Urinary tract infection, site not specified: Secondary | ICD-10-CM | POA: Diagnosis present

## 2013-10-17 DIAGNOSIS — F411 Generalized anxiety disorder: Secondary | ICD-10-CM | POA: Diagnosis present

## 2013-10-17 DIAGNOSIS — D649 Anemia, unspecified: Secondary | ICD-10-CM | POA: Diagnosis present

## 2013-10-17 DIAGNOSIS — E1110 Type 2 diabetes mellitus with ketoacidosis without coma: Secondary | ICD-10-CM

## 2013-10-17 DIAGNOSIS — B952 Enterococcus as the cause of diseases classified elsewhere: Secondary | ICD-10-CM | POA: Diagnosis present

## 2013-10-17 DIAGNOSIS — F1010 Alcohol abuse, uncomplicated: Secondary | ICD-10-CM | POA: Diagnosis present

## 2013-10-17 DIAGNOSIS — E876 Hypokalemia: Secondary | ICD-10-CM | POA: Diagnosis not present

## 2013-10-17 DIAGNOSIS — E785 Hyperlipidemia, unspecified: Secondary | ICD-10-CM | POA: Diagnosis present

## 2013-10-17 DIAGNOSIS — F17200 Nicotine dependence, unspecified, uncomplicated: Secondary | ICD-10-CM

## 2013-10-17 DIAGNOSIS — Z881 Allergy status to other antibiotic agents status: Secondary | ICD-10-CM

## 2013-10-17 DIAGNOSIS — J449 Chronic obstructive pulmonary disease, unspecified: Secondary | ICD-10-CM | POA: Diagnosis present

## 2013-10-17 DIAGNOSIS — J4489 Other specified chronic obstructive pulmonary disease: Secondary | ICD-10-CM | POA: Diagnosis present

## 2013-10-17 DIAGNOSIS — R059 Cough, unspecified: Secondary | ICD-10-CM

## 2013-10-17 DIAGNOSIS — E860 Dehydration: Secondary | ICD-10-CM | POA: Diagnosis present

## 2013-10-17 DIAGNOSIS — E781 Pure hyperglyceridemia: Secondary | ICD-10-CM | POA: Diagnosis present

## 2013-10-17 DIAGNOSIS — R5383 Other fatigue: Secondary | ICD-10-CM

## 2013-10-17 DIAGNOSIS — K859 Acute pancreatitis without necrosis or infection, unspecified: Secondary | ICD-10-CM | POA: Diagnosis present

## 2013-10-17 DIAGNOSIS — Z888 Allergy status to other drugs, medicaments and biological substances status: Secondary | ICD-10-CM

## 2013-10-17 DIAGNOSIS — D72829 Elevated white blood cell count, unspecified: Secondary | ICD-10-CM | POA: Diagnosis present

## 2013-10-17 DIAGNOSIS — F329 Major depressive disorder, single episode, unspecified: Secondary | ICD-10-CM

## 2013-10-17 HISTORY — DX: Type 2 diabetes mellitus without complications: E11.9

## 2013-10-17 HISTORY — DX: Pure hyperglyceridemia: E78.1

## 2013-10-17 HISTORY — DX: Hyperlipidemia, unspecified: E78.5

## 2013-10-17 LAB — URINALYSIS, ROUTINE W REFLEX MICROSCOPIC
Glucose, UA: >1000 mg/dL — AB
Leukocytes, UA: NEGATIVE
Nitrite: NEGATIVE
PH: 5 (ref 5.0–8.0)
Protein, ur: 100 mg/dL — AB
Specific Gravity, Urine: 1.033 — ABNORMAL HIGH (ref 1.005–1.030)
Urobilinogen, UA: 0.2 mg/dL (ref 0.0–1.0)

## 2013-10-17 LAB — CBC
HCT: 46.7 % — ABNORMAL HIGH (ref 36.0–46.0)
HCT: 49.7 % — ABNORMAL HIGH (ref 36.0–46.0)
HEMOGLOBIN: 16.3 g/dL — AB (ref 12.0–15.0)
Hemoglobin: 17.3 g/dL — ABNORMAL HIGH (ref 12.0–15.0)
MCH: 30.9 pg (ref 26.0–34.0)
MCH: 31.2 pg (ref 26.0–34.0)
MCHC: 34.9 g/dL (ref 30.0–36.0)
MCHC: 34.8 g/dL (ref 30.0–36.0)
MCV: 88.6 fL (ref 78.0–100.0)
MCV: 89.7 fL (ref 78.0–100.0)
PLATELETS: 182 10*3/uL (ref 150–400)
Platelets: 223 10*3/uL (ref 150–400)
RBC: 5.27 MIL/uL — ABNORMAL HIGH (ref 3.87–5.11)
RBC: 5.54 MIL/uL — ABNORMAL HIGH (ref 3.87–5.11)
RDW: 14.8 % (ref 11.5–15.5)
RDW: 14.8 % (ref 11.5–15.5)
WBC: 20.5 10*3/uL — ABNORMAL HIGH (ref 4.0–10.5)
WBC: 19.9 10*3/uL — ABNORMAL HIGH (ref 4.0–10.5)

## 2013-10-17 LAB — BASIC METABOLIC PANEL
BUN: 31 mg/dL — ABNORMAL HIGH (ref 6–23)
BUN: 28 mg/dL — ABNORMAL HIGH (ref 6–23)
CHLORIDE: 96 meq/L (ref 96–112)
CO2: 7 mEq/L — CL (ref 19–32)
CO2: 7 meq/L — AB (ref 19–32)
CREATININE: 1.21 mg/dL — AB (ref 0.50–1.10)
Calcium: 10.5 mg/dL (ref 8.4–10.5)
Calcium: 9.9 mg/dL (ref 8.4–10.5)
Chloride: 87 mEq/L — ABNORMAL LOW (ref 96–112)
Creatinine, Ser: 1.32 mg/dL — ABNORMAL HIGH (ref 0.50–1.10)
GFR calc Af Amer: 56 mL/min — ABNORMAL LOW (ref >90)
GFR calc Af Amer: 62 mL/min — ABNORMAL LOW (ref >90)
GFR calc non Af Amer: 48 mL/min — ABNORMAL LOW (ref >90)
GFR calc non Af Amer: 54 mL/min — ABNORMAL LOW (ref >90)
GLUCOSE: 812 mg/dL — AB (ref 70–99)
GLUCOSE: 561 mg/dL — AB (ref 70–99)
Potassium: 5 mEq/L (ref 3.7–5.3)
Potassium: 5.3 mEq/L (ref 3.7–5.3)
Sodium: 133 mEq/L — ABNORMAL LOW (ref 137–147)
Sodium: 138 mEq/L (ref 137–147)

## 2013-10-17 LAB — I-STAT TROPONIN, ED: Troponin i, poc: 0.01 ng/mL (ref 0.00–0.08)

## 2013-10-17 LAB — URINE MICROSCOPIC-ADD ON

## 2013-10-17 LAB — CBG MONITORING, ED
Glucose-Capillary: >600 mg/dL (ref 70–99)
Glucose-Capillary: 353 mg/dL — ABNORMAL HIGH (ref 70–99)
Glucose-Capillary: 452 mg/dL — ABNORMAL HIGH (ref 70–99)
Glucose-Capillary: 276 mg/dL — ABNORMAL HIGH (ref 70–99)

## 2013-10-17 MED ORDER — POTASSIUM CHLORIDE 10 MEQ/100ML IV SOLN
10.0000 meq | INTRAVENOUS | Status: AC
  Administered 2013-10-17: 10 meq via INTRAVENOUS
  Filled 2013-10-17: qty 100

## 2013-10-17 MED ORDER — DEXTROSE 50 % IV SOLN: 25.0000 mL | INTRAVENOUS | Status: DC | PRN

## 2013-10-17 MED ORDER — DEXTROSE-NACL 5-0.45 % IV SOLN: INTRAVENOUS | Status: DC

## 2013-10-17 MED ORDER — SODIUM CHLORIDE 0.9 % IV BOLUS (SEPSIS)
1000.0000 mL | Freq: Once | INTRAVENOUS | Status: AC
  Administered 2013-10-17: 1000 mL via INTRAVENOUS

## 2013-10-17 MED ORDER — SODIUM CHLORIDE 0.9 % IV SOLN: INTRAVENOUS | Status: AC

## 2013-10-17 MED ORDER — SODIUM CHLORIDE 0.9 % IV SOLN
INTRAVENOUS | Status: DC
  Filled 2013-10-17: qty 1

## 2013-10-17 MED ORDER — HEPARIN SODIUM (PORCINE) 5000 UNIT/ML IJ SOLN
5000.0000 [IU] | Freq: Three times a day (TID) | INTRAMUSCULAR | Status: DC
  Administered 2013-10-17 – 2013-10-26 (×26): 5000 [IU] via SUBCUTANEOUS
  Filled 2013-10-17 (×29): qty 1

## 2013-10-17 MED ORDER — ONDANSETRON HCL 4 MG/2ML IJ SOLN
4.0000 mg | Freq: Once | INTRAMUSCULAR | Status: AC
  Administered 2013-10-17: 4 mg via INTRAVENOUS
  Filled 2013-10-17: qty 2

## 2013-10-17 MED ORDER — POTASSIUM CHLORIDE 10 MEQ/100ML IV SOLN
10.0000 meq | Freq: Once | INTRAVENOUS | Status: AC
  Administered 2013-10-17: 10 meq via INTRAVENOUS
  Filled 2013-10-17: qty 100

## 2013-10-17 MED ORDER — SODIUM CHLORIDE 0.9 % IV SOLN
1000.0000 mL | Freq: Once | INTRAVENOUS | Status: AC
  Administered 2013-10-17: 1000 mL via INTRAVENOUS

## 2013-10-17 MED ORDER — ALBUTEROL SULFATE (2.5 MG/3ML) 0.083% IN NEBU
5.0000 mg | INHALATION_SOLUTION | Freq: Once | RESPIRATORY_TRACT | Status: AC
  Administered 2013-10-17: 5 mg via RESPIRATORY_TRACT
  Filled 2013-10-17: qty 6

## 2013-10-17 MED ORDER — SODIUM CHLORIDE 0.9 % IV SOLN
1000.0000 mL | Freq: Once | INTRAVENOUS | Status: AC
Start: 2013-10-17 — End: 2013-10-17
  Administered 2013-10-17: 1000 mL via INTRAVENOUS

## 2013-10-17 MED ORDER — DEXTROSE-NACL 5-0.45 % IV SOLN
INTRAVENOUS | Status: DC
  Administered 2013-10-18: 1000 mL via INTRAVENOUS
  Administered 2013-10-18 – 2013-10-20 (×4): via INTRAVENOUS
  Administered 2013-10-20 (×2): 150 mL/h via INTRAVENOUS
  Administered 2013-10-21 – 2013-10-22 (×2): via INTRAVENOUS
  Administered 2013-10-22: 1000 mL via INTRAVENOUS
  Administered 2013-10-23: 08:00:00 via INTRAVENOUS

## 2013-10-17 MED ORDER — QUETIAPINE FUMARATE 400 MG PO TABS
400.0000 mg | ORAL_TABLET | Freq: Every day | ORAL | Status: DC
  Administered 2013-10-18 – 2013-10-25 (×8): 400 mg via ORAL
  Filled 2013-10-17 (×10): qty 1

## 2013-10-17 MED ORDER — IPRATROPIUM BROMIDE 0.02 % IN SOLN
0.5000 mg | Freq: Once | RESPIRATORY_TRACT | Status: AC
  Administered 2013-10-17: 0.5 mg via RESPIRATORY_TRACT
  Filled 2013-10-17: qty 2.5

## 2013-10-17 MED ORDER — SODIUM CHLORIDE 0.9 % IJ SOLN
3.0000 mL | Freq: Two times a day (BID) | INTRAMUSCULAR | Status: DC
Start: 2013-10-17 — End: 2013-10-26
  Administered 2013-10-19 – 2013-10-26 (×11): 3 mL via INTRAVENOUS

## 2013-10-17 MED ORDER — METHYLPREDNISOLONE SODIUM SUCC 125 MG IJ SOLR
125.0000 mg | Freq: Once | INTRAMUSCULAR | Status: AC
  Administered 2013-10-17: 125 mg via INTRAVENOUS
  Filled 2013-10-17: qty 2

## 2013-10-17 MED ORDER — SODIUM CHLORIDE 0.9 % IV SOLN
1000.0000 mL | INTRAVENOUS | Status: DC
  Administered 2013-10-17: 1000 mL via INTRAVENOUS

## 2013-10-17 MED ORDER — SODIUM CHLORIDE 0.9 % IV SOLN
INTRAVENOUS | Status: AC
  Administered 2013-10-17 – 2013-10-18 (×2): 5.4 [IU]/h via INTRAVENOUS
  Administered 2013-10-18: 2.2 [IU]/h via INTRAVENOUS
  Administered 2013-10-18 (×2): 3.1 [IU]/h via INTRAVENOUS
  Administered 2013-10-18: 2 [IU]/h via INTRAVENOUS
  Administered 2013-10-19: 4.2 [IU]/h via INTRAVENOUS
  Administered 2013-10-20: 5.7 [IU]/h via INTRAVENOUS
  Administered 2013-10-20: 2.8 [IU]/h via INTRAVENOUS
  Administered 2013-10-20 (×2): 3.3 [IU]/h via INTRAVENOUS
  Administered 2013-10-20: 3.4 [IU]/h via INTRAVENOUS
  Administered 2013-10-20 (×2): 3.3 [IU]/h via INTRAVENOUS
  Administered 2013-10-20: 3.5 [IU]/h via INTRAVENOUS
  Administered 2013-10-21: 2.9 [IU]/h via INTRAVENOUS
  Administered 2013-10-21: 2.6 [IU]/h via INTRAVENOUS
  Administered 2013-10-21: 14.2 [IU]/h via INTRAVENOUS
  Administered 2013-10-21: 2.3 [IU]/h via INTRAVENOUS
  Administered 2013-10-21: 3.6 [IU]/h via INTRAVENOUS
  Administered 2013-10-22: 1.6 [IU]/h via INTRAVENOUS
  Filled 2013-10-17 (×5): qty 1

## 2013-10-17 MED ORDER — CLONAZEPAM 0.5 MG PO TABS: 0.5000 mg | ORAL_TABLET | Freq: Two times a day (BID) | ORAL | Status: DC | PRN

## 2013-10-17 MED ORDER — SODIUM CHLORIDE 0.9 % IV SOLN: INTRAVENOUS | Status: DC

## 2013-10-17 NOTE — ED Notes (Signed)
Dr. Rubin PayorPickering made aware of Critical lab values.

## 2013-10-17 NOTE — ED Notes (Signed)
Critical Labs: Glucose 561 and CO2: 7 reported to Dr. Rubin PayorPickering.

## 2013-10-17 NOTE — ED Notes (Signed)
Per [facility] pt from home, pt called 911 due to chest pain greater than one month that has worsened over the last 2 days, pt refused to answer anymore of EMS questions or allow them to assess. [facility] "[facility] they can you everything when we get there I just need a [facility] ride to the hospital." pt refused IV, medication, VS, and EKG from EMS, pt alert and oriented upon arrival, answering questions appropriately

## 2013-10-17 NOTE — ED Notes (Signed)
CRITICAL CO2: 7

## 2013-10-17 NOTE — ED Notes (Signed)
Phlebotomy at bedside.

## 2013-10-17 NOTE — ED Notes (Signed)
Critical Glucose 812

## 2013-10-17 NOTE — ED Provider Notes (Signed)
CSN: [id number]     Arrival date & time 10/17/13  1628 History   First MD Initiated Contact with Patient 10/17/13 1732     Chief Complaint  Patient presents with  . Chest Pain     (Consider location/radiation/quality/duration/timing/severity/associated sxs/prior Treatment) HPI Comments: Patient presents to the emergency department with chief complaint of chest pain, shortness of breath. She states that her symptoms have been ongoing for about a month, but have recently worsened over the past 2 days. Has a history of COPD. She reports associated fever, cough, and chest tightness. She has not tried taking anything new in her symptoms. No aggravating or alleviating factors.  The history is provided by the patient. No language interpreter was used.    Past Medical History  Diagnosis Date  . Gallstones   . Depression   . Anxiety   . COPD (chronic obstructive pulmonary disease)   . Bipolar 1 disorder    Past Surgical History  Procedure Laterality Date  . Cholecystectomy  11/07/2011  . Cholecystectomy  11/09/2011    Procedure: LAPAROSCOPIC CHOLECYSTECTOMY;  Surgeon: Lodema Pilot, DO;  Location: [facility] OR;  Service: General;  Laterality: N/A;  Lap. Chole. with IOC   No family history on file. History  Substance Use Topics  . Smoking status: Current Every Day Smoker -- 0.50 packs/day    Types: Cigarettes  . Smokeless tobacco: Never Used  . Alcohol Use: Yes   OB History   Grav Para Term Preterm Abortions TAB SAB Ect Mult Living   5 5 5       5      Review of Systems  Constitutional: Positive for fever. Negative for chills.  Respiratory: Positive for cough, chest tightness and shortness of breath.   Cardiovascular: Positive for chest pain.  Gastrointestinal: Negative for nausea, vomiting, diarrhea and constipation.  Genitourinary: Negative for dysuria.      Allergies  Abilify; Lithium; Amoxicillin; and Wellbutrin  Home Medications   Current Outpatient Rx  Name  Route  Sig   Dispense  Refill  . albuterol (PROAIR HFA) 108 (90 BASE) MCG/ACT inhaler   Inhalation   Inhale 2 puffs into the lungs every 6 (six) hours as needed for wheezing (wheezing).         . clonazePAM (KLONOPIN) 0.5 MG tablet   Oral   Take 0.5 mg by mouth 2 (two) times daily as needed for anxiety.         Marland Kitchen doxepin (SINEQUAN) 25 MG capsule   Oral   Take 25 mg by mouth 3 (three) times daily.         Marland Kitchen ibuprofen (ADVIL,MOTRIN) 200 MG tablet   Oral   Take 200 mg by mouth every 8 (eight) hours as needed for pain (pain).         Marland Kitchen ibuprofen (ADVIL,MOTRIN) 800 MG tablet   Oral   Take 1 tablet (800 mg total) by mouth every 8 (eight) hours as needed for pain.   30 tablet   0    BP 138/87  Pulse 128  Resp 18  SpO2 100% Physical Exam  Nursing note and vitals reviewed. Constitutional: She is oriented to person, place, and time. She appears well-developed and well-nourished.  HENT:  Head: Normocephalic and atraumatic.  Eyes: Conjunctivae and EOM are normal. Pupils are equal, round, and reactive to light.  Neck: Normal range of motion. Neck supple.  Cardiovascular: Regular rhythm.  Exam reveals no gallop and no friction rub.   No murmur heard. Tachycardia  Pulmonary/Chest: Effort normal. No respiratory distress. She has wheezes. She has no rales. She exhibits no tenderness.  Bilateral and expiratory wheezes  Abdominal: Soft. Bowel sounds are normal. She exhibits no distension and no mass. There is no tenderness. There is no rebound and no guarding.  Musculoskeletal: Normal range of motion. She exhibits no edema and no tenderness.  Neurological: She is alert and oriented to person, place, and time.  Skin: Skin is warm and dry.  Psychiatric: She has a normal mood and affect. Her behavior is normal. Judgment and thought content normal.    ED Course  Procedures (including critical care time) Results for orders placed during the hospital encounter of 10/17/13  CBC      Result Value  Ref Range   WBC 20.5 (*) 4.0 - 10.5 K/uL   RBC 5.27 (*) 3.87 - 5.11 MIL/uL   Hemoglobin 16.3 (*) 12.0 - 15.0 g/dL   HCT 91.446.7 (*) 78.236.0 - 95.646.0 %   MCV 88.6  78.0 - 100.0 fL   MCH 30.9  26.0 - 34.0 pg   MCHC 34.9  30.0 - 36.0 g/dL   RDW 21.314.8  08.611.5 - 57.815.5 %   Platelets 223  150 - 400 K/uL  BASIC METABOLIC PANEL      Result Value Ref Range   Sodium 133 (*) 137 - 147 mEq/L   Potassium 5.0  3.7 - 5.3 mEq/L   Chloride 87 (*) 96 - 112 mEq/L   CO2 7 (*) 19 - 32 mEq/L   Glucose, Bld 812 (*) 70 - 99 mg/dL   BUN 31 (*) 6 - 23 mg/dL   Creatinine, Ser 4.691.32 (*) 0.50 - 1.10 mg/dL   Calcium 62.910.5  8.4 - 52.810.5 mg/dL   GFR calc non Af Amer 48 (*) >90 mL/min   GFR calc Af Amer 56 (*) >90 mL/min  URINALYSIS, ROUTINE W REFLEX MICROSCOPIC      Result Value Ref Range   Color, Urine YELLOW  YELLOW   APPearance HAZY (*) CLEAR   Specific Gravity, Urine 1.033 (*) 1.005 - 1.030   pH 5.0  5.0 - 8.0   Glucose, UA >1000 (*) NEGATIVE mg/dL   Hgb urine dipstick LARGE (*) NEGATIVE   Bilirubin Urine MODERATE (*) NEGATIVE   Ketones, ur >80 (*) NEGATIVE mg/dL   Protein, ur 413100 (*) NEGATIVE mg/dL   Urobilinogen, UA 0.2  0.0 - 1.0 mg/dL   Nitrite NEGATIVE  NEGATIVE   Leukocytes, UA NEGATIVE  NEGATIVE  URINE MICROSCOPIC-ADD ON      Result Value Ref Range   Squamous Epithelial / LPF MANY (*) RARE   WBC, UA 3-6  <3 WBC/hpf   RBC / HPF 7-10  <3 RBC/hpf   Bacteria, UA FEW (*) RARE   Casts GRANULAR CAST (*) NEGATIVE   Urine-Other RARE YEAST    I-STAT TROPOININ, ED      Result Value Ref Range   Troponin i, poc 0.01  0.00 - 0.08 ng/mL   Comment 3           CBG MONITORING, ED      Result Value Ref Range   Glucose-Capillary >600 (*) 70 - 99 mg/dL   Dg Chest 2 View  2/4/40104/01/2014   CLINICAL DATA:  Shortness of breath.  Vomiting and chest pain.  EXAM: CHEST  2 VIEW  COMPARISON:  PA and lateral chest 03/22/2012.  FINDINGS: The lungs are clear. Heart size is normal. No pneumothorax or pleural effusion. Cholecystectomy clips  are  noted.  IMPRESSION: No acute disease.   Electronically Signed   By: Drusilla Kanner M.D.   On: 10/17/2013 17:45    Imaging Review Dg Chest 2 View  10/17/2013   CLINICAL DATA:  Shortness of breath.  Vomiting and chest pain.  EXAM: CHEST  2 VIEW  COMPARISON:  PA and lateral chest 03/22/2012.  FINDINGS: The lungs are clear. Heart size is normal. No pneumothorax or pleural effusion. Cholecystectomy clips are noted.  IMPRESSION: No acute disease.   Electronically Signed   By: Drusilla Kanner M.D.   On: 10/17/2013 17:45     EKG Interpretation None      MDM   Final diagnoses:  DKA (diabetic ketoacidoses)    Patient was shortness breath, chest tightness, and cough. History of COPD. Suspect COPD exacerbation, as chest x-ray is negative, however the patient does have an elevated white blood cell count. She's not have a fever. Treat with nebulizer, steroids, and we'll reevaluate.  Patient appears to have new onset DKA. Anion gap is 39. Glucose is 812. Discussed patient with the hospitalist team, who will admit the patient. I have started the glucose stabilizer, and given the patient fluids. Have explained the condition of patient, and informed her that she'll need to stay in hospital. She understands agrees with the plan.   Medications  0.9 %  sodium chloride infusion (1,000 mLs Intravenous New Bag/Given 10/17/13 1910)    Followed by  0.9 %  sodium chloride infusion (not administered)    Followed by  0.9 %  sodium chloride infusion (not administered)    Followed by  0.9 %  sodium chloride infusion (not administered)  dextrose 5 %-0.45 % sodium chloride infusion (not administered)  heparin injection 5,000 Units (not administered)  sodium chloride 0.9 % injection 3 mL (not administered)  0.9 %  sodium chloride infusion (not administered)  dextrose 5 %-0.45 % sodium chloride infusion (not administered)  insulin regular (NOVOLIN R,HUMULIN R) 1 Units/mL in sodium chloride 0.9 % 100 mL  infusion (5.4 Units/hr Intravenous New Bag/Given 10/17/13 1929)  dextrose 50 % solution 25 mL (not administered)  0.9 %  sodium chloride infusion (not administered)  potassium chloride 10 mEq in 100 mL IVPB (not administered)  clonazePAM (KLONOPIN) tablet 0.5 mg (not administered)  QUEtiapine (SEROQUEL) tablet 400 mg (not administered)  sodium chloride 0.9 % bolus 1,000 mL (1,000 mLs Intravenous New Bag/Given 10/17/13 1807)  ipratropium (ATROVENT) nebulizer solution 0.5 mg (0.5 mg Nebulization Given 10/17/13 1755)  albuterol (PROVENTIL) (2.5 MG/3ML) 0.083% nebulizer solution 5 mg (5 mg Nebulization Given 10/17/13 1755)  methylPREDNISolone sodium succinate (SOLU-MEDROL) 125 mg/2 mL injection 125 mg (125 mg Intravenous Given 10/17/13 1809)    CRITICAL CARE Performed by: Roxy Horseman   Total critical care time: 32  Critical care time was exclusive of separately billable procedures and treating other patients.  Critical care was necessary to treat or prevent imminent or life-threatening deterioration.  Critical care was time spent personally by me on the following activities: development of treatment plan with patient and/or surrogate as well as nursing, discussions with consultants, evaluation of patient's response to treatment, examination of patient, obtaining history from patient or surrogate, ordering and performing treatments and interventions, ordering and review of laboratory studies, ordering and review of radiographic studies, pulse oximetry and re-evaluation of patient's condition.   Roxy Horseman, PA-C 10/17/13 1930  Roxy Horseman, PA-C 10/17/13 [phone]

## 2013-10-17 NOTE — ED Notes (Signed)
PA and PA student at bedside  

## 2013-10-17 NOTE — H&P (Signed)
[facility] History and Physical  Stacy Pierce ZOX:[id number] DOB: [birth year] DOA: 10/17/2013  Referring physician: Emergency Department PCP: Default, Provider, MD  Specialists:   Chief Complaint: SOB  HPI: Stacy Pierce is a 43 y.o. female  With a hx of depression and copd who initially presented to the ED with complaints of worsening sob. In the ED , pt was noted to have a serum glucose of 812 with a bicarb of 7 and a calculated GAP of 39. An insulin gtt was started in the ED along with IVF and the hospitalist service consulted for admission.  Review of Systems:  Per above, the remainder of the 10pt ros reviewed and are neg  Past Medical History  Diagnosis Date  . Gallstones   . Depression   . Anxiety   . COPD (chronic obstructive pulmonary disease)   . Bipolar 1 disorder    Past Surgical History  Procedure Laterality Date  . Cholecystectomy  11/07/2011  . Cholecystectomy  11/09/2011    Procedure: LAPAROSCOPIC CHOLECYSTECTOMY;  Surgeon: Lodema Pilot, DO;  Location: [facility] OR;  Service: General;  Laterality: N/A;  Lap. Chole. with IOC   Social History:  reports that she has been smoking Cigarettes.  She has been smoking about 0.50 packs per day. She has never used smokeless tobacco. She reports that she drinks alcohol. She reports that she uses illicit drugs (Marijuana).  where does patient live--home, ALF, SNF? and with whom if at home?  Can patient participate in ADLs?  Allergies  Allergen Reactions  . Abilify [Aripiprazole] Hives and Itching  . Amoxicillin Hives, Itching and Rash  . Lithium Hives  . Wellbutrin [Bupropion] Rash    No family history on file.  (be sure to complete)  Prior to Admission medications   Medication Sig Start Date End Date Taking? Authorizing Provider  clonazePAM (KLONOPIN) 0.5 MG tablet Take 0.5 mg by mouth 2 (two) times daily as needed for anxiety.   Yes Historical Provider, MD  ibuprofen (ADVIL,MOTRIN) 200 MG tablet Take 200 mg by mouth  every 8 (eight) hours as needed for pain (pain).   Yes Historical Provider, MD  QUEtiapine (SEROQUEL) 400 MG tablet Take 400 mg by mouth at bedtime.   Yes Historical Provider, MD  albuterol (PROAIR HFA) 108 (90 BASE) MCG/ACT inhaler Inhale 2 puffs into the lungs every 6 (six) hours as needed for wheezing (wheezing).    Historical Provider, MD  doxepin (SINEQUAN) 25 MG capsule Take 25 mg by mouth 3 (three) times daily.    Historical Provider, MD  ibuprofen (ADVIL,MOTRIN) 800 MG tablet Take 1 tablet (800 mg total) by mouth every 8 (eight) hours as needed for pain. 04/25/13   Carlyle Dolly, PA-C   Physical Exam: Filed Vitals:   10/17/13 1637 10/17/13 1800  BP: 138/87 133/94  Pulse: 128 128  Temp:  97.7 F (36.5 C)  TempSrc: Other (Comment) Oral  Resp: 18 22  SpO2: 100% 97%     General:  Awake, in nad  Eyes: PERRL B  ENT: Membranes dry, dentition fair  Neck: trachea midline,neck supple  Cardiovascular: regular, s1, s2  Respiratory: normal resp effort, no wheezing  Abdomen: soft, nondistended  Skin: normal skin turgor, no abnormal skin lesions seen  Musculoskeletal: perfused, no clubbing  Psychiatric: mood/affect normal // no auditory/visual hallucinations  Neurologic: cn2-12 grossly intact, strength/sensation intact  Labs on Admission:  Basic Metabolic Panel:  Recent Labs Lab 10/17/13 1654  NA 133*  K 5.0  CL 87*  CO2 7*  GLUCOSE 812*  BUN 31*  CREATININE 1.32*  CALCIUM 10.5   Liver Function Tests: No results found for this basename: AST, ALT, ALKPHOS, BILITOT, PROT, ALBUMIN,  in the last 168 hours No results found for this basename: LIPASE, AMYLASE,  in the last 168 hours No results found for this basename: AMMONIA,  in the last 168 hours CBC:  Recent Labs Lab 10/17/13 1654  WBC 20.5*  HGB 16.3*  HCT 46.7*  MCV 88.6  PLT 223   Cardiac Enzymes: No results found for this basename: CKTOTAL, CKMB, CKMBINDEX, TROPONINI,  in the last 168  hours  BNP (last 3 results) No results found for this basename: PROBNP,  in the last 8760 hours CBG: No results found for this basename: GLUCAP,  in the last 168 hours  Radiological Exams on Admission: Dg Chest 2 View  10/17/2013   CLINICAL DATA:  Shortness of breath.  Vomiting and chest pain.  EXAM: CHEST  2 VIEW  COMPARISON:  PA and lateral chest 03/22/2012.  FINDINGS: The lungs are clear. Heart size is normal. No pneumothorax or pleural effusion. Cholecystectomy clips are noted.  IMPRESSION: No acute disease.   Electronically Signed   By: Drusilla Kannerhomas  Dalessio M.D.   On: 10/17/2013 17:45    Assessment/Plan Principal Problem:   DKA (diabetic ketoacidoses) Active Problems:   TOBACCO ABUSE   DEPRESSIVE DISORDER NOT ELSEWHERE CLASSIFIED   1. DKA 1. New onset 2. Will continue insulin gtt per DKA protocol with plans to eventually transition off to subq 3. Cont with aggressive IVF as tolerated 4. Follow lytes closely and correct as needed 5. Will check a1c and lipids 6. Will admit to stepdown, tele 2. Depression 1. Stable 2. Cont home meds 3. Tobacco abuse 1. Counseled at bedside 4. DVT prophylaxis 1. Heparin subQ  Code Status: Full (must indicate code status--if unknown or must be presumed, indicate so) Family Communication: Pt in room (indicate person spoken with, if applicable, with phone number if by telephone) Disposition Plan: Pending (indicate anticipated LOS)  Time spent: 40min  Ranger Petrich K [facility] Pager [phone]  If 7PM-7AM, please contact night-coverage [email or web address] Password [email or web address] 10/17/2013, 6:52 PM

## 2013-10-18 ENCOUNTER — Inpatient Hospital Stay (HOSPITAL_COMMUNITY): Payer: Medicaid Other

## 2013-10-18 ENCOUNTER — Encounter (HOSPITAL_COMMUNITY): Payer: Self-pay | Admitting: General Practice

## 2013-10-18 DIAGNOSIS — E781 Pure hyperglyceridemia: Secondary | ICD-10-CM

## 2013-10-18 DIAGNOSIS — E785 Hyperlipidemia, unspecified: Secondary | ICD-10-CM

## 2013-10-18 DIAGNOSIS — R1013 Epigastric pain: Secondary | ICD-10-CM

## 2013-10-18 DIAGNOSIS — D72829 Elevated white blood cell count, unspecified: Secondary | ICD-10-CM

## 2013-10-18 DIAGNOSIS — K859 Acute pancreatitis without necrosis or infection, unspecified: Secondary | ICD-10-CM | POA: Diagnosis present

## 2013-10-18 HISTORY — DX: Pure hyperglyceridemia: E78.1

## 2013-10-18 HISTORY — DX: Hyperlipidemia, unspecified: E78.5

## 2013-10-18 LAB — TROPONIN I
Troponin I: <0.3 ng/mL (ref <0.30)
Troponin I: <0.3 ng/mL (ref <0.30)

## 2013-10-18 LAB — BASIC METABOLIC PANEL
BUN: 15 mg/dL (ref 6–23)
BUN: 12 mg/dL (ref 6–23)
BUN: 11 mg/dL (ref 6–23)
BUN: 9 mg/dL (ref 6–23)
BUN: 9 mg/dL (ref 6–23)
BUN: 6 mg/dL (ref 6–23)
CALCIUM: 8.3 mg/dL — AB (ref 8.4–10.5)
CALCIUM: 8.3 mg/dL — AB (ref 8.4–10.5)
CALCIUM: 8.4 mg/dL (ref 8.4–10.5)
CALCIUM: 8.3 mg/dL — AB (ref 8.4–10.5)
CO2: 13 meq/L — AB (ref 19–32)
CO2: 13 mEq/L — ABNORMAL LOW (ref 19–32)
CO2: 16 meq/L — AB (ref 19–32)
CO2: 13 meq/L — AB (ref 19–32)
CO2: 14 mEq/L — ABNORMAL LOW (ref 19–32)
CO2: 16 mEq/L — ABNORMAL LOW (ref 19–32)
CREATININE: 0.75 mg/dL (ref 0.50–1.10)
CREATININE: 0.72 mg/dL (ref 0.50–1.10)
CREATININE: 0.62 mg/dL (ref 0.50–1.10)
CREATININE: 0.63 mg/dL (ref 0.50–1.10)
Calcium: 8.3 mg/dL — ABNORMAL LOW (ref 8.4–10.5)
Calcium: 8.4 mg/dL (ref 8.4–10.5)
Chloride: 116 mEq/L — ABNORMAL HIGH (ref 96–112)
Chloride: 115 mEq/L — ABNORMAL HIGH (ref 96–112)
Chloride: 112 mEq/L (ref 96–112)
Chloride: 110 mEq/L (ref 96–112)
Chloride: 109 mEq/L (ref 96–112)
Chloride: 110 mEq/L (ref 96–112)
Creatinine, Ser: 0.66 mg/dL (ref 0.50–1.10)
Creatinine, Ser: 0.64 mg/dL (ref 0.50–1.10)
GFR calc Af Amer: >90 mL/min (ref >90)
GFR calc Af Amer: >90 mL/min (ref >90)
GFR calc Af Amer: >90 mL/min (ref >90)
GFR calc non Af Amer: >90 mL/min (ref >90)
GFR calc non Af Amer: >90 mL/min (ref >90)
GFR calc non Af Amer: >90 mL/min (ref >90)
GLUCOSE: 186 mg/dL — AB (ref 70–99)
GLUCOSE: 146 mg/dL — AB (ref 70–99)
GLUCOSE: 192 mg/dL — AB (ref 70–99)
Glucose, Bld: 139 mg/dL — ABNORMAL HIGH (ref 70–99)
Glucose, Bld: 126 mg/dL — ABNORMAL HIGH (ref 70–99)
Glucose, Bld: 160 mg/dL — ABNORMAL HIGH (ref 70–99)
Potassium: 4.4 mEq/L (ref 3.7–5.3)
Potassium: 4.7 mEq/L (ref 3.7–5.3)
Potassium: 4.2 mEq/L (ref 3.7–5.3)
Potassium: 4 mEq/L (ref 3.7–5.3)
Potassium: 3.8 mEq/L (ref 3.7–5.3)
Potassium: 3.1 mEq/L — ABNORMAL LOW (ref 3.7–5.3)
SODIUM: 144 meq/L (ref 137–147)
Sodium: 146 mEq/L (ref 137–147)
Sodium: 145 mEq/L (ref 137–147)
Sodium: 143 mEq/L (ref 137–147)
Sodium: 140 mEq/L (ref 137–147)
Sodium: 141 mEq/L (ref 137–147)

## 2013-10-18 LAB — GLUCOSE, CAPILLARY
GLUCOSE-CAPILLARY: 144 mg/dL — AB (ref 70–99)
GLUCOSE-CAPILLARY: 150 mg/dL — AB (ref 70–99)
GLUCOSE-CAPILLARY: 153 mg/dL — AB (ref 70–99)
GLUCOSE-CAPILLARY: 181 mg/dL — AB (ref 70–99)
GLUCOSE-CAPILLARY: 206 mg/dL — AB (ref 70–99)
GLUCOSE-CAPILLARY: 223 mg/dL — AB (ref 70–99)
GLUCOSE-CAPILLARY: 262 mg/dL — AB (ref 70–99)
Glucose-Capillary: 261 mg/dL — ABNORMAL HIGH (ref 70–99)
Glucose-Capillary: 187 mg/dL — ABNORMAL HIGH (ref 70–99)
Glucose-Capillary: 188 mg/dL — ABNORMAL HIGH (ref 70–99)
Glucose-Capillary: 157 mg/dL — ABNORMAL HIGH (ref 70–99)
Glucose-Capillary: 148 mg/dL — ABNORMAL HIGH (ref 70–99)
Glucose-Capillary: 115 mg/dL — ABNORMAL HIGH (ref 70–99)
Glucose-Capillary: 137 mg/dL — ABNORMAL HIGH (ref 70–99)
Glucose-Capillary: 134 mg/dL — ABNORMAL HIGH (ref 70–99)
Glucose-Capillary: 134 mg/dL — ABNORMAL HIGH (ref 70–99)
Glucose-Capillary: 177 mg/dL — ABNORMAL HIGH (ref 70–99)
Glucose-Capillary: 191 mg/dL — ABNORMAL HIGH (ref 70–99)
Glucose-Capillary: 127 mg/dL — ABNORMAL HIGH (ref 70–99)
Glucose-Capillary: 145 mg/dL — ABNORMAL HIGH (ref 70–99)
Glucose-Capillary: 150 mg/dL — ABNORMAL HIGH (ref 70–99)
Glucose-Capillary: 190 mg/dL — ABNORMAL HIGH (ref 70–99)

## 2013-10-18 LAB — COMPREHENSIVE METABOLIC PANEL
ALBUMIN: 3.3 g/dL — AB (ref 3.5–5.2)
ALK PHOS: 112 U/L (ref 39–117)
ALT: 31 U/L (ref 0–35)
AST: 20 U/L (ref 0–37)
BUN: 17 mg/dL (ref 6–23)
CO2: 10 mEq/L — CL (ref 19–32)
Calcium: 8 mg/dL — ABNORMAL LOW (ref 8.4–10.5)
Chloride: 114 mEq/L — ABNORMAL HIGH (ref 96–112)
Creatinine, Ser: 0.72 mg/dL (ref 0.50–1.10)
GFR calc Af Amer: >90 mL/min (ref >90)
GFR calc non Af Amer: >90 mL/min (ref >90)
Glucose, Bld: 253 mg/dL — ABNORMAL HIGH (ref 70–99)
POTASSIUM: 4.6 meq/L (ref 3.7–5.3)
Sodium: 145 mEq/L (ref 137–147)
Total Bilirubin: 0.2 mg/dL — ABNORMAL LOW (ref 0.3–1.2)
Total Protein: 6.9 g/dL (ref 6.0–8.3)

## 2013-10-18 LAB — LIPID PANEL
Cholesterol: 222 mg/dL — ABNORMAL HIGH (ref 0–200)
HDL: 29 mg/dL — ABNORMAL LOW (ref >39)
LDL Cholesterol: UNDETERMINED mg/dL (ref 0–99)
Total CHOL/HDL Ratio: 7.7 RATIO
Triglycerides: 939 mg/dL — ABNORMAL HIGH (ref <150)
VLDL: UNDETERMINED mg/dL (ref 0–40)

## 2013-10-18 LAB — CULTURE, GROUP A STREP

## 2013-10-18 LAB — HEPATIC FUNCTION PANEL
ALT: 27 U/L (ref 0–35)
AST: 25 U/L (ref 0–37)
Albumin: 2.8 g/dL — ABNORMAL LOW (ref 3.5–5.2)
Alkaline Phosphatase: 101 U/L (ref 39–117)
Bilirubin, Direct: <0.2 mg/dL (ref 0.0–0.3)
Total Bilirubin: 0.4 mg/dL (ref 0.3–1.2)
Total Protein: 6.3 g/dL (ref 6.0–8.3)

## 2013-10-18 LAB — HEMOGLOBIN A1C
Hgb A1c MFr Bld: 9.8 % — ABNORMAL HIGH (ref <5.7)
Mean Plasma Glucose: 235 mg/dL — ABNORMAL HIGH (ref <117)

## 2013-10-18 LAB — MRSA PCR SCREENING: MRSA by PCR: NEGATIVE

## 2013-10-18 LAB — CBC
HCT: 39.3 % (ref 36.0–46.0)
Hemoglobin: 13.5 g/dL (ref 12.0–15.0)
MCH: 30.2 pg (ref 26.0–34.0)
MCHC: 34.4 g/dL (ref 30.0–36.0)
MCV: 87.9 fL (ref 78.0–100.0)
Platelets: 179 10*3/uL (ref 150–400)
RBC: 4.47 MIL/uL (ref 3.87–5.11)
RDW: 14.8 % (ref 11.5–15.5)
WBC: 14.8 10*3/uL — AB (ref 4.0–10.5)

## 2013-10-18 LAB — LIPASE, BLOOD: LIPASE: 1904 U/L — AB (ref 11–59)

## 2013-10-18 MED ORDER — PANTOPRAZOLE SODIUM 40 MG PO TBEC: 40.0000 mg | DELAYED_RELEASE_TABLET | Freq: Every day | ORAL | Status: DC

## 2013-10-18 MED ORDER — FENOFIBRATE 160 MG PO TABS
160.0000 mg | ORAL_TABLET | Freq: Every day | ORAL | Status: DC
  Administered 2013-10-18 – 2013-10-26 (×9): 160 mg via ORAL
  Filled 2013-10-18 (×9): qty 1

## 2013-10-18 MED ORDER — SODIUM CHLORIDE 0.9 % IV BOLUS (SEPSIS)
1000.0000 mL | Freq: Once | INTRAVENOUS | Status: AC
  Administered 2013-10-18: 1000 mL via INTRAVENOUS

## 2013-10-18 MED ORDER — PANTOPRAZOLE SODIUM 40 MG PO TBEC
40.0000 mg | DELAYED_RELEASE_TABLET | Freq: Every day | ORAL | Status: DC
  Filled 2013-10-18: qty 1

## 2013-10-18 MED ORDER — PANTOPRAZOLE SODIUM 40 MG PO TBEC
DELAYED_RELEASE_TABLET | ORAL | Status: AC
  Administered 2013-10-18: 40 mg
  Filled 2013-10-18: qty 1

## 2013-10-18 MED ORDER — ONDANSETRON HCL 4 MG/2ML IJ SOLN
4.0000 mg | Freq: Four times a day (QID) | INTRAMUSCULAR | Status: DC | PRN
  Administered 2013-10-18 – 2013-10-25 (×5): 4 mg via INTRAVENOUS
  Filled 2013-10-18 (×5): qty 2

## 2013-10-18 MED ORDER — ONDANSETRON HCL 4 MG/2ML IJ SOLN
4.0000 mg | Freq: Four times a day (QID) | INTRAMUSCULAR | Status: DC
  Administered 2013-10-18: 4 mg via INTRAVENOUS
  Filled 2013-10-18: qty 2

## 2013-10-18 MED ORDER — ATORVASTATIN CALCIUM 40 MG PO TABS
40.0000 mg | ORAL_TABLET | Freq: Every day | ORAL | Status: DC
  Administered 2013-10-19 – 2013-10-25 (×8): 40 mg via ORAL
  Filled 2013-10-18 (×9): qty 1

## 2013-10-18 MED ORDER — PANTOPRAZOLE SODIUM 40 MG IV SOLR: 40.0000 mg | INTRAVENOUS | Status: DC

## 2013-10-18 MED ORDER — LORAZEPAM 2 MG/ML IJ SOLN
0.5000 mg | Freq: Two times a day (BID) | INTRAMUSCULAR | Status: DC | PRN
  Administered 2013-10-19 – 2013-10-20 (×2): 0.5 mg via INTRAVENOUS
  Filled 2013-10-18 (×3): qty 1

## 2013-10-18 MED ORDER — MOMETASONE FURO-FORMOTEROL FUM 100-5 MCG/ACT IN AERO
2.0000 | INHALATION_SPRAY | Freq: Two times a day (BID) | RESPIRATORY_TRACT | Status: DC
  Administered 2013-10-18 – 2013-10-26 (×16): 2 via RESPIRATORY_TRACT
  Filled 2013-10-18: qty 8.8

## 2013-10-18 MED ORDER — LIVING WELL WITH DIABETES BOOK
Freq: Once | Status: AC
  Administered 2013-10-18: 1
  Filled 2013-10-18: qty 1

## 2013-10-18 MED ORDER — SODIUM CHLORIDE 0.9 % IV BOLUS (SEPSIS)
2000.0000 mL | Freq: Once | INTRAVENOUS | Status: AC
  Administered 2013-10-18 (×2): 1000 mL via INTRAVENOUS

## 2013-10-18 MED ORDER — INSULIN PEN STARTER KIT
1.0000 | Freq: Once | Status: AC
  Administered 2013-10-18: 1
  Filled 2013-10-18: qty 1

## 2013-10-18 MED ORDER — POTASSIUM CHLORIDE 10 MEQ/100ML IV SOLN
10.0000 meq | INTRAVENOUS | Status: AC
  Administered 2013-10-18 (×2): 10 meq via INTRAVENOUS
  Filled 2013-10-18 (×2): qty 100

## 2013-10-18 MED ORDER — NICOTINE 21 MG/24HR TD PT24
21.0000 mg | MEDICATED_PATCH | Freq: Every day | TRANSDERMAL | Status: DC
  Administered 2013-10-18 – 2013-10-26 (×9): 21 mg via TRANSDERMAL
  Filled 2013-10-18 (×9): qty 1

## 2013-10-18 NOTE — Progress Notes (Signed)
Utilization Review Completed.  

## 2013-10-18 NOTE — ED Provider Notes (Signed)
Medical screening examination/treatment/procedure(s) were performed by non-physician practitioner and as supervising physician I was immediately available for consultation/collaboration.   EKG Interpretation None       Rosemae Mcquown R. [place]ha Doster, MD 10/18/13 0023 

## 2013-10-18 NOTE — Progress Notes (Addendum)
[facility] PROGRESS NOTE  Stacy Pierce CHE:[id number] DOB: [birth year] DOA: 10/17/2013 PCP: [name], NP  Assessment/Plan: #1 DKA Questionable etiology. Patient new-onset type headache. Patient denies any chest pain. No shortness of breath. Patient with some complaints of epigastric abdominal pain which he feels is related to her nausea and emesis. Will check a lipase level. Check a KUB. Cycle cardiac enzymes every 6 hours x3. EKG with sinus tachycardia. Patient still has an anion gap of 17. Patient is acidotic with a bicarbonate of 13. Repeat be met every 2 hours until anion gap is closed and then transitioned to subcutaneous insulin. Give a bolus of normal saline 1 L times one now. Increase IV fluids 150 cc an hour. Hemoglobin A1c is pending. Will consult a diabetic coordinator. Continue supportive care.  #2 hyperlipidemia/hypertriglyceridemia anemia Patient's triglyceride level is 939. Will place on a statin and [facility]. Follow.  #3 abdominal pain/epigastric May be secondary to problem #1. Will check a lipase level as patient's triglyceride level is elevated. Check a KUB. Supportive care.  #4 depression Stable. Continue seroquel.  #5 COPD Stable. Will place on Spiriva and advair  #6 tobacco abuse Tobacco cessation. Place on a nicotine patch.  #7 leukocytosis Likely reactive leukocytosis secondary to problem #1. Urinalysis is negative. Chest x-ray is negative. WBC trending down. Follow.  #8 prophylaxis PPI for GI prophylaxis. Heparin for DVT prophylaxis.  Code Status: Full Family Communication: Updated patient no family at bedside. Disposition Plan: Remaining step down today.   Consultants:  None  Procedures:  Chest x-ray 10/17/2013  Antibiotics:  None  HPI/Subjective: Patient complaining of nausea and emesis. Patient stating she's hungry. Patient wants to eat. Patient denies any chest pain. Patient denies any shortness of breath.   Objective: Filed  Vitals:   10/18/13 0739  BP: 125/86  Pulse: 78  Temp: 98.2 F (36.8 C)  Resp: 3    Intake/Output Summary (Last 24 hours) at 10/18/13 0845 Last data filed at 10/18/13 0717  Gross per 24 hour  Intake 242.79 ml  Output    750 ml  Net -507.21 ml   Filed Weights   10/18/13 0015  Weight: 67.8 kg (149 lb 7.6 oz)    Exam:   General:  NAD  Cardiovascular: RRR  Respiratory: CTAB  Abdomen: sOFT/nd/ ttp IN EPIGASTRIUM, +BS  Musculoskeletal: No c/c/e  Data Reviewed: Basic Metabolic Panel:  Recent Labs Lab 10/17/13 1654 10/17/13 2024 10/18/13 0052 10/18/13 0320  NA 133* 138 145 146  K 5.0 5.3 4.6 4.4  CL 87* 96 114* 116*  CO2 7* 7* 10* 13*  GLUCOSE 812* 561* 253* 186*  BUN 31* 28* 17 15  CREATININE 1.32* 1.21* 0.72 0.75  CALCIUM 10.5 9.9 8.0* 8.3*   Liver Function Tests:  Recent Labs Lab 10/18/13 0052  AST 20  ALT 31  ALKPHOS 112  BILITOT 0.2*  PROT 6.9  ALBUMIN 3.3*   No results found for this basename: LIPASE, AMYLASE,  in the last 168 hours No results found for this basename: AMMONIA,  in the last 168 hours CBC:  Recent Labs Lab 10/17/13 1654 10/17/13 2024 10/18/13 0052  WBC 20.5* 19.9* 14.8*  HGB 16.3* 17.3* 13.5  HCT 46.7* 49.7* 39.3  MCV 88.6 89.7 87.9  PLT 223 182 179   Cardiac Enzymes: No results found for this basename: CKTOTAL, CKMB, CKMBINDEX, TROPONINI,  in the last 168 hours BNP (last 3 results) No results found for this basename: PROBNP,  in the last 8760 hours CBG:  Recent Labs Lab 10/18/13 0313 10/18/13 0420 10/18/13 0532 10/18/13 0642 10/18/13 0742  GLUCAP 187* 188* 157* 144* 153*    Recent Results (from the past 240 hour(s))  RAPID STREP SCREEN     Status: None   Collection Time    10/16/13  6:48 AM      Result Value Ref Range Status   Streptococcus, Group A Screen (Direct) NEGATIVE  NEGATIVE Final   Comment: (NOTE)     A Rapid Antigen test may result negative if the antigen level in the     sample is below the  detection level of this test. The FDA has not     cleared this test as a stand-alone test therefore the rapid antigen     negative result has reflexed to a Group A Strep culture.  CULTURE, GROUP A STREP     Status: None   Collection Time    10/16/13  6:48 AM      Result Value Ref Range Status   Specimen Description THROAT   Final   Special Requests ADDED 0715   Final   Culture     Final   Value: No Beta Hemolytic Streptococci Isolated     Performed at [facility]   Report Status 10/18/2013 FINAL   Final  MRSA PCR SCREENING     Status: None   Collection Time    10/17/13 11:50 PM      Result Value Ref Range Status   MRSA by PCR NEGATIVE  NEGATIVE Final   Comment:            The GeneXpert MRSA Assay (FDA     approved for NASAL specimens     only), is one component of a     comprehensive MRSA colonization     surveillance program. It is not     intended to diagnose MRSA     infection nor to guide or     monitor treatment for     MRSA infections.     Studies: Dg Chest 2 View  10/17/2013   CLINICAL DATA:  Shortness of breath.  Vomiting and chest pain.  EXAM: CHEST  2 VIEW  COMPARISON:  PA and lateral chest 03/22/2012.  FINDINGS: The lungs are clear. Heart size is normal. No pneumothorax or pleural effusion. Cholecystectomy clips are noted.  IMPRESSION: No acute disease.   Electronically Signed   By: Inge Rise M.D.   On: 10/17/2013 17:45    Scheduled Meds: . atorvastatin  40 mg Oral q1800  . fenofibrate  160 mg Oral Daily  . heparin  5,000 Units Subcutaneous 3 times per day  . mometasone-formoterol  2 puff Inhalation BID  . nicotine  21 mg Transdermal Daily  . pantoprazole  40 mg Oral Q0600  . potassium chloride  10 mEq Intravenous Q1 Hr x 2  . QUEtiapine  400 mg Oral QHS  . sodium chloride  3 mL Intravenous Q12H   Continuous Infusions: . sodium chloride 1,000 mL (10/17/13 2207)  . sodium chloride    . dextrose 5 % and 0.45% NaCl    . dextrose 5 % and 0.45%  NaCl 150 mL (10/18/13 0840)  . insulin (NOVOLIN-R) infusion 5.7 Units/hr (10/18/13 0746)    Principal Problem:   DKA (diabetic ketoacidoses) Active Problems:   TOBACCO ABUSE   DEPRESSIVE DISORDER NOT ELSEWHERE CLASSIFIED   Other and unspecified hyperlipidemia   Hypertriglyceridemia   Leukocytosis, unspecified   Abdominal pain, epigastric  Time spent: [facility] Pager [phone]. If 7PM-7AM, please contact night-coverage at [email or web address], password [facility] 10/18/2013, 8:45 AM  LOS: 1 day

## 2013-10-18 NOTE — Progress Notes (Signed)
DM video showed to patient but only watched 501 (Basic skills  of controlling DM)and 502 ( preventing DM complications).

## 2013-10-18 NOTE — Progress Notes (Addendum)
Received referral for new onset diabetes.  Went to talk with patient and she is sleepy and upset that she is not allowed to eat.  While talking with the patient, she kept closing eyes and drifting off to sleep.  Talked with RN and she feels that patient is too drowsy for education today.  Bedside RN has living well with diabetes booklet and insulin pen starter kit for patient education.  Inpatient Diabetes Coordinator will re-visit with patient tomorrow.  At this time, patient is still requiring insulin drip.  Labs at 12:00 resulted with CO2 of 16 and AG of 15. Would not recommend transitioning to SQ insulin until AG <12, CO2 >20, and 4 consecutive glucose readings in target goal.  Thanks, Barnie Alderman, RN, MSN, Alex Diabetes Coordinator Inpatient Diabetes Program [phone] (Team Pager) [phone] ([facility] office) [phone] [facility] office)

## 2013-10-19 DIAGNOSIS — K859 Acute pancreatitis without necrosis or infection, unspecified: Secondary | ICD-10-CM

## 2013-10-19 LAB — BASIC METABOLIC PANEL
BUN: 8 mg/dL (ref 6–23)
BUN: 5 mg/dL — ABNORMAL LOW (ref 6–23)
BUN: 4 mg/dL — AB (ref 6–23)
BUN: 4 mg/dL — AB (ref 6–23)
BUN: 4 mg/dL — ABNORMAL LOW (ref 6–23)
BUN: 4 mg/dL — ABNORMAL LOW (ref 6–23)
BUN: 3 mg/dL — ABNORMAL LOW (ref 6–23)
BUN: 3 mg/dL — ABNORMAL LOW (ref 6–23)
BUN: 3 mg/dL — ABNORMAL LOW (ref 6–23)
BUN: 3 mg/dL — AB (ref 6–23)
BUN: <3 mg/dL — ABNORMAL LOW (ref 6–23)
BUN: <3 mg/dL — ABNORMAL LOW (ref 6–23)
BUN: <3 mg/dL — ABNORMAL LOW (ref 6–23)
CALCIUM: 8.4 mg/dL (ref 8.4–10.5)
CALCIUM: 8.2 mg/dL — AB (ref 8.4–10.5)
CALCIUM: 8.3 mg/dL — AB (ref 8.4–10.5)
CALCIUM: 8.3 mg/dL — AB (ref 8.4–10.5)
CALCIUM: 8.4 mg/dL (ref 8.4–10.5)
CALCIUM: 8.5 mg/dL (ref 8.4–10.5)
CALCIUM: 8.2 mg/dL — AB (ref 8.4–10.5)
CALCIUM: 8.5 mg/dL (ref 8.4–10.5)
CHLORIDE: 110 meq/L (ref 96–112)
CHLORIDE: 111 meq/L (ref 96–112)
CHLORIDE: 108 meq/L (ref 96–112)
CO2: 14 mEq/L — ABNORMAL LOW (ref 19–32)
CO2: 16 mEq/L — ABNORMAL LOW (ref 19–32)
CO2: 13 mEq/L — ABNORMAL LOW (ref 19–32)
CO2: 17 meq/L — AB (ref 19–32)
CO2: 16 mEq/L — ABNORMAL LOW (ref 19–32)
CO2: 15 meq/L — AB (ref 19–32)
CO2: 17 mEq/L — ABNORMAL LOW (ref 19–32)
CO2: 17 mEq/L — ABNORMAL LOW (ref 19–32)
CO2: 17 mEq/L — ABNORMAL LOW (ref 19–32)
CO2: 16 mEq/L — ABNORMAL LOW (ref 19–32)
CO2: 18 mEq/L — ABNORMAL LOW (ref 19–32)
CO2: 19 mEq/L (ref 19–32)
CO2: 18 mEq/L — ABNORMAL LOW (ref 19–32)
CREATININE: 0.65 mg/dL (ref 0.50–1.10)
CREATININE: 0.63 mg/dL (ref 0.50–1.10)
CREATININE: 0.66 mg/dL (ref 0.50–1.10)
CREATININE: 0.7 mg/dL (ref 0.50–1.10)
CREATININE: 0.72 mg/dL (ref 0.50–1.10)
CREATININE: 0.68 mg/dL (ref 0.50–1.10)
CREATININE: 0.58 mg/dL (ref 0.50–1.10)
CREATININE: 0.58 mg/dL (ref 0.50–1.10)
Calcium: 8.4 mg/dL (ref 8.4–10.5)
Calcium: 8.1 mg/dL — ABNORMAL LOW (ref 8.4–10.5)
Calcium: 8.2 mg/dL — ABNORMAL LOW (ref 8.4–10.5)
Calcium: 7.7 mg/dL — ABNORMAL LOW (ref 8.4–10.5)
Calcium: 8.1 mg/dL — ABNORMAL LOW (ref 8.4–10.5)
Chloride: 108 mEq/L (ref 96–112)
Chloride: 113 mEq/L — ABNORMAL HIGH (ref 96–112)
Chloride: 111 mEq/L (ref 96–112)
Chloride: 114 mEq/L — ABNORMAL HIGH (ref 96–112)
Chloride: 111 mEq/L (ref 96–112)
Chloride: 111 mEq/L (ref 96–112)
Chloride: 111 mEq/L (ref 96–112)
Chloride: 111 mEq/L (ref 96–112)
Chloride: 111 mEq/L (ref 96–112)
Chloride: 111 mEq/L (ref 96–112)
Creatinine, Ser: 0.66 mg/dL (ref 0.50–1.10)
Creatinine, Ser: 0.71 mg/dL (ref 0.50–1.10)
Creatinine, Ser: 0.64 mg/dL (ref 0.50–1.10)
Creatinine, Ser: 0.62 mg/dL (ref 0.50–1.10)
Creatinine, Ser: 0.57 mg/dL (ref 0.50–1.10)
GFR calc Af Amer: >90 mL/min (ref >90)
GFR calc Af Amer: >90 mL/min (ref >90)
GFR calc Af Amer: >90 mL/min (ref >90)
GFR calc Af Amer: >90 mL/min (ref >90)
GFR calc Af Amer: >90 mL/min (ref >90)
GFR calc Af Amer: >90 mL/min (ref >90)
GFR calc Af Amer: >90 mL/min (ref >90)
GFR calc Af Amer: >90 mL/min (ref >90)
GFR calc Af Amer: >90 mL/min (ref >90)
GFR calc Af Amer: >90 mL/min (ref >90)
GFR calc Af Amer: >90 mL/min (ref >90)
GFR calc non Af Amer: >90 mL/min (ref >90)
GFR calc non Af Amer: >90 mL/min (ref >90)
GFR calc non Af Amer: >90 mL/min (ref >90)
GFR calc non Af Amer: >90 mL/min (ref >90)
GFR calc non Af Amer: >90 mL/min (ref >90)
GLUCOSE: 148 mg/dL — AB (ref 70–99)
GLUCOSE: 188 mg/dL — AB (ref 70–99)
GLUCOSE: 146 mg/dL — AB (ref 70–99)
GLUCOSE: 117 mg/dL — AB (ref 70–99)
Glucose, Bld: 119 mg/dL — ABNORMAL HIGH (ref 70–99)
Glucose, Bld: 160 mg/dL — ABNORMAL HIGH (ref 70–99)
Glucose, Bld: 184 mg/dL — ABNORMAL HIGH (ref 70–99)
Glucose, Bld: 180 mg/dL — ABNORMAL HIGH (ref 70–99)
Glucose, Bld: 159 mg/dL — ABNORMAL HIGH (ref 70–99)
Glucose, Bld: 124 mg/dL — ABNORMAL HIGH (ref 70–99)
Glucose, Bld: 171 mg/dL — ABNORMAL HIGH (ref 70–99)
Glucose, Bld: 178 mg/dL — ABNORMAL HIGH (ref 70–99)
Glucose, Bld: 191 mg/dL — ABNORMAL HIGH (ref 70–99)
POTASSIUM: 3.7 meq/L (ref 3.7–5.3)
POTASSIUM: 2.9 meq/L — AB (ref 3.7–5.3)
POTASSIUM: 3.8 meq/L (ref 3.7–5.3)
POTASSIUM: 3.2 meq/L — AB (ref 3.7–5.3)
POTASSIUM: 3.3 meq/L — AB (ref 3.7–5.3)
Potassium: 4.3 mEq/L (ref 3.7–5.3)
Potassium: 2.7 mEq/L — CL (ref 3.7–5.3)
Potassium: 2.8 mEq/L — CL (ref 3.7–5.3)
Potassium: 3.7 mEq/L (ref 3.7–5.3)
Potassium: 3.3 mEq/L — ABNORMAL LOW (ref 3.7–5.3)
Potassium: 3 mEq/L — ABNORMAL LOW (ref 3.7–5.3)
Potassium: 3.1 mEq/L — ABNORMAL LOW (ref 3.7–5.3)
Potassium: 3.5 mEq/L — ABNORMAL LOW (ref 3.7–5.3)
SODIUM: 144 meq/L (ref 137–147)
SODIUM: 144 meq/L (ref 137–147)
SODIUM: 143 meq/L (ref 137–147)
SODIUM: 141 meq/L (ref 137–147)
Sodium: 139 mEq/L (ref 137–147)
Sodium: 144 mEq/L (ref 137–147)
Sodium: 144 mEq/L (ref 137–147)
Sodium: 147 mEq/L (ref 137–147)
Sodium: 143 mEq/L (ref 137–147)
Sodium: 143 mEq/L (ref 137–147)
Sodium: 142 mEq/L (ref 137–147)
Sodium: 145 mEq/L (ref 137–147)
Sodium: 144 mEq/L (ref 137–147)

## 2013-10-19 LAB — CBC
HCT: 34.6 % — ABNORMAL LOW (ref 36.0–46.0)
HEMOGLOBIN: 11.9 g/dL — AB (ref 12.0–15.0)
MCH: 30 pg (ref 26.0–34.0)
MCHC: 34.4 g/dL (ref 30.0–36.0)
MCV: 87.2 fL (ref 78.0–100.0)
Platelets: 154 10*3/uL (ref 150–400)
RBC: 3.97 MIL/uL (ref 3.87–5.11)
RDW: 14.9 % (ref 11.5–15.5)
WBC: 13.4 10*3/uL — ABNORMAL HIGH (ref 4.0–10.5)

## 2013-10-19 LAB — GLUCOSE, CAPILLARY
GLUCOSE-CAPILLARY: 155 mg/dL — AB (ref 70–99)
GLUCOSE-CAPILLARY: 169 mg/dL — AB (ref 70–99)
GLUCOSE-CAPILLARY: 133 mg/dL — AB (ref 70–99)
GLUCOSE-CAPILLARY: 144 mg/dL — AB (ref 70–99)
GLUCOSE-CAPILLARY: 172 mg/dL — AB (ref 70–99)
GLUCOSE-CAPILLARY: 180 mg/dL — AB (ref 70–99)
Glucose-Capillary: 130 mg/dL — ABNORMAL HIGH (ref 70–99)
Glucose-Capillary: 173 mg/dL — ABNORMAL HIGH (ref 70–99)
Glucose-Capillary: 186 mg/dL — ABNORMAL HIGH (ref 70–99)
Glucose-Capillary: 167 mg/dL — ABNORMAL HIGH (ref 70–99)
Glucose-Capillary: 187 mg/dL — ABNORMAL HIGH (ref 70–99)
Glucose-Capillary: 150 mg/dL — ABNORMAL HIGH (ref 70–99)
Glucose-Capillary: 165 mg/dL — ABNORMAL HIGH (ref 70–99)
Glucose-Capillary: 137 mg/dL — ABNORMAL HIGH (ref 70–99)
Glucose-Capillary: 114 mg/dL — ABNORMAL HIGH (ref 70–99)
Glucose-Capillary: 135 mg/dL — ABNORMAL HIGH (ref 70–99)
Glucose-Capillary: 140 mg/dL — ABNORMAL HIGH (ref 70–99)
Glucose-Capillary: 145 mg/dL — ABNORMAL HIGH (ref 70–99)

## 2013-10-19 LAB — MAGNESIUM: MAGNESIUM: 1.6 mg/dL (ref 1.5–2.5)

## 2013-10-19 LAB — LIPASE, BLOOD: Lipase: 570 U/L — ABNORMAL HIGH (ref 11–59)

## 2013-10-19 MED ORDER — SODIUM CHLORIDE 0.9 % IV BOLUS (SEPSIS)
1000.0000 mL | Freq: Once | INTRAVENOUS | Status: AC
  Administered 2013-10-19: 1000 mL via INTRAVENOUS

## 2013-10-19 MED ORDER — TIOTROPIUM BROMIDE MONOHYDRATE 18 MCG IN CAPS
18.0000 ug | ORAL_CAPSULE | Freq: Every day | RESPIRATORY_TRACT | Status: DC
  Administered 2013-10-19 – 2013-10-26 (×7): 18 ug via RESPIRATORY_TRACT
  Filled 2013-10-19 (×2): qty 5

## 2013-10-19 MED ORDER — TRAMADOL HCL 50 MG PO TABS
100.0000 mg | ORAL_TABLET | Freq: Four times a day (QID) | ORAL | Status: DC | PRN
  Administered 2013-10-19 – 2013-10-25 (×12): 100 mg via ORAL
  Filled 2013-10-19 (×12): qty 2

## 2013-10-19 MED ORDER — POTASSIUM CHLORIDE 10 MEQ/100ML IV SOLN
10.0000 meq | INTRAVENOUS | Status: AC
  Administered 2013-10-19 (×6): 10 meq via INTRAVENOUS
  Filled 2013-10-19 (×6): qty 100

## 2013-10-19 MED ORDER — POTASSIUM CHLORIDE CRYS ER 20 MEQ PO TBCR
40.0000 meq | EXTENDED_RELEASE_TABLET | ORAL | Status: AC
  Administered 2013-10-19 – 2013-10-20 (×3): 40 meq via ORAL
  Filled 2013-10-19 (×3): qty 2

## 2013-10-19 MED ORDER — POTASSIUM CHLORIDE CRYS ER 20 MEQ PO TBCR
40.0000 meq | EXTENDED_RELEASE_TABLET | ORAL | Status: DC
  Administered 2013-10-19: 40 meq via ORAL
  Filled 2013-10-19: qty 2

## 2013-10-19 MED ORDER — CYCLOBENZAPRINE HCL 10 MG PO TABS
5.0000 mg | ORAL_TABLET | Freq: Three times a day (TID) | ORAL | Status: DC | PRN
  Administered 2013-10-19: 5 mg via ORAL
  Filled 2013-10-19: qty 1

## 2013-10-19 MED ORDER — PANTOPRAZOLE SODIUM 40 MG IV SOLR
40.0000 mg | INTRAVENOUS | Status: DC
  Administered 2013-10-19 – 2013-10-22 (×4): 40 mg via INTRAVENOUS
  Filled 2013-10-19 (×4): qty 40

## 2013-10-19 NOTE — Progress Notes (Signed)
Critical lab value received from lab. K 2.9. MD paged and notified. No new orders given at this time.

## 2013-10-19 NOTE — Progress Notes (Signed)
CRITICAL VALUE ALERT  Critical value received:  Potassium 2.8   Date of notification:  10/19/2013  Time of notification:  0350  Critical value read back:yes  Nurse who received alert:  Gates RiggM. Zaine Elsass, RN   MD notified (1st page):  Notified [facility] Donnamarie PoagK. Kirby, NP via text page  Time of first page:  0350  MD notified (2nd page):  Time of second page:  Responding MD:  Donnamarie PoagK. Kirby, NP  Time MD responded:  0400

## 2013-10-19 NOTE — Progress Notes (Signed)
[facility] PROGRESS NOTE  Stacy NephewGail R Pierce XBM:[id number] DOB: 05-29-1971 DOA: 10/17/2013 PCP: [name], [name], NP  Assessment/Plan: #1 DKA Questionable etiology. Patient new-onset DM. Maybe secondary to acute pancreatitis. Patient with prior hx of ETOH abuse and states last drink was in March. Patient denies any chest pain. No shortness of breath. KUB negative. Cardiac enzymes negative x 3. EKG with sinus tachycardia. Patient still has an anion gap of 17. Patient is acidotic with a bicarbonate of 15. Continue serial BMETs until anion gap is closed and bicarb > 20, then transitioned to subcutaneous insulin. Give a bolus of normal saline 1 L times one now. Continue IV fluids at 150 cc an hour. Hemoglobin A1c is 9.8. Continue supportive care.  #2 hyperlipidemia/hypertriglyceridemia anemia Patient's triglyceride level is 939. Continue statin and TriCor. Follow.  #3 Acute pancreatitis/ epig abd pain Lipase level elevated yesterday at 1904 and trending down now at 570. KUB neg. Continue IVF. Supportive care. Follow. May have ice chips.  #4 depression Stable. Continue seroquel.  #5 COPD Stable. Continue Spiriva and dulera.  #6 tobacco abuse Tobacco cessation. Continue nicotine patch.  #7 leukocytosis Likely reactive leukocytosis secondary to problem #1. Urinalysis is negative. Chest x-ray is negative. WBC trending down. Follow.  #8 prophylaxis PPI for GI prophylaxis. Heparin for DVT prophylaxis.  Code Status: Full Family Communication: Updated patient no family at bedside. Disposition Plan: Remaining step down today.   Consultants:  None  Procedures:  Chest x-ray 10/17/2013  Abd pain 10/18/13  Antibiotics:  None  HPI/Subjective: Patient denies nausea and emesis. Patient stating she's hungry. Patient wants to eat. Patient denies any chest pain. Patient c/o knee pain.  Objective: Filed Vitals:   10/19/13 0735  BP: 124/78  Pulse: 92  Temp: 98 F (36.7 C)  Resp: 18     Intake/Output Summary (Last 24 hours) at 10/19/13 0850 Last data filed at 10/19/13 [id number]  Gross per 24 hour  Intake 3088.53 ml  Output    601 ml  Net 2487.53 ml   Filed Weights   10/18/13 0015 10/19/13 0411  Weight: 67.8 kg (149 lb 7.6 oz) 67.8 kg (149 lb 7.6 oz)    Exam:   General:  NAD  Cardiovascular: RRR  Respiratory: CTAB  Abdomen: sOFT/nd/ nttp, +BS  Musculoskeletal: No c/c/e  Data Reviewed: Basic Metabolic Panel:  Recent Labs Lab 10/18/13 2222 10/19/13 0145 10/19/13 0240 10/19/13 0431 10/19/13 0635  NA 144 144 147  144 143 143  K 3.1* 2.7* 2.8*  3.7 3.7 3.3*  CL 110 113* 114*  111 111 111  CO2 16* 16* 17*  13* 16* 15*  GLUCOSE 160* 160* 148*  188* 184* 180*  BUN 6 5* 4*  4* 4* 4*  CREATININE 0.63 0.63 0.66  0.71 0.66 0.70  CALCIUM 8.3* 8.2* 8.4  8.3* 8.3* 8.4   Liver Function Tests:  Recent Labs Lab 10/18/13 0052 10/18/13 1421  AST 20 25  ALT 31 27  ALKPHOS 112 101  BILITOT 0.2* 0.4  PROT 6.9 6.3  ALBUMIN 3.3* 2.8*    Recent Labs Lab 10/18/13 0930 10/19/13 0240  LIPASE 1904* 570*   No results found for this basename: AMMONIA,  in the last 168 hours CBC:  Recent Labs Lab 10/17/13 1654 10/17/13 2024 10/18/13 0052 10/19/13 0240  WBC 20.5* 19.9* 14.8* 13.4*  HGB 16.3* 17.3* 13.5 11.9*  HCT 46.7* 49.7* 39.3 34.6*  MCV 88.6 89.7 87.9 87.2  PLT 223 182 179 154   Cardiac Enzymes:  Recent  Labs Lab 10/18/13 0930 10/18/13 1433 10/18/13 2222  TROPONINI <0.30 <0.30 <0.30   BNP (last 3 results) No results found for this basename: PROBNP,  in the last 8760 hours CBG:  Recent Labs Lab 10/18/13 1900 10/18/13 2009 10/18/13 2129 10/18/13 2236 10/18/13 2343  GLUCAP 127* 145* 150* 190* 181*    Recent Results (from the past 240 hour(s))  RAPID STREP SCREEN     Status: None   Collection Time    10/16/13  6:48 AM      Result Value Ref Range Status   Streptococcus, Group A Screen (Direct) NEGATIVE  NEGATIVE Final    Comment: (NOTE)     A Rapid Antigen test may result negative if the antigen level in the     sample is below the detection level of this test. The FDA has not     cleared this test as a stand-alone test therefore the rapid antigen     negative result has reflexed to a Group A Strep culture.  CULTURE, GROUP A STREP     Status: None   Collection Time    10/16/13  6:48 AM      Result Value Ref Range Status   Specimen Description THROAT   Final   Special Requests ADDED 0715   Final   Culture     Final   Value: No Beta Hemolytic Streptococci Isolated     Performed at [facility]   Report Status 10/18/2013 FINAL   Final  MRSA PCR SCREENING     Status: None   Collection Time    10/17/13 11:50 PM      Result Value Ref Range Status   MRSA by PCR NEGATIVE  NEGATIVE Final   Comment:            The GeneXpert MRSA Assay (FDA     approved for NASAL specimens     only), is one component of a     comprehensive MRSA colonization     surveillance program. It is not     intended to diagnose MRSA     infection nor to guide or     monitor treatment for     MRSA infections.     Studies: Dg Chest 2 View  10/17/2013   CLINICAL DATA:  Shortness of breath.  Vomiting and chest pain.  EXAM: CHEST  2 VIEW  COMPARISON:  PA and lateral chest 03/22/2012.  FINDINGS: The lungs are clear. Heart size is normal. No pneumothorax or pleural effusion. Cholecystectomy clips are noted.  IMPRESSION: No acute disease.   Electronically Signed   By: Drusilla Kanner M.D.   On: 10/17/2013 17:45   Dg Abd 1 View  10/18/2013   CLINICAL DATA:  Abdominal pain and nausea  EXAM: ABDOMEN - 1 VIEW  COMPARISON:  None.  FINDINGS: Bowel gas pattern is unremarkable. No obstruction or free air is seen on this supine examination. There are several 1 mm calculi in the mid to lower pole right kidney. There is an intrauterine device in the mid-pelvis. There are apparent phleboliths in the pelvis. There are surgical clips in the right  upper quadrant region.  IMPRESSION: Bowel gas pattern unremarkable.  Intrauterine device in mid pelvis.   Electronically Signed   By: Bretta Bang M.D.   On: 10/18/2013 12:02    Scheduled Meds: . atorvastatin  40 mg Oral q1800  . fenofibrate  160 mg Oral Daily  . heparin  5,000 Units Subcutaneous 3 times per  day  . mometasone-formoterol  2 puff Inhalation BID  . nicotine  21 mg Transdermal Daily  . pantoprazole  40 mg Oral Daily  . potassium chloride  10 mEq Intravenous Q1 Hr x 6  . QUEtiapine  400 mg Oral QHS  . sodium chloride  1,000 mL Intravenous Once  . sodium chloride  3 mL Intravenous Q12H   Continuous Infusions: . sodium chloride 1,000 mL (10/17/13 2207)  . sodium chloride    . dextrose 5 % and 0.45% NaCl    . dextrose 5 % and 0.45% NaCl 1,000 mL (10/18/13 1221)  . insulin (NOVOLIN-R) infusion 7.6 Units/hr (10/19/13 0809)    Principal Problem:   DKA (diabetic ketoacidoses) Active Problems:   Pancreatitis, acute   TOBACCO ABUSE   DEPRESSIVE DISORDER NOT ELSEWHERE CLASSIFIED   Other and unspecified hyperlipidemia   Hypertriglyceridemia   Leukocytosis, unspecified   Abdominal pain, epigastric    Time spent: 40 MINS    Anne Fu [facility] Pager [phone]. If 7PM-7AM, please contact night-coverage at [email or web address], password [facility] 10/19/2013, 8:50 AM  LOS: 2 days

## 2013-10-19 NOTE — Plan of Care (Signed)
Problem: Food- and Nutrition-Related Knowledge Deficit (NB-1.1) Goal: Nutrition education Formal process to instruct or train a patient/client in a skill or to impart knowledge to help patients/clients voluntarily manage or modify food choices and eating behavior to maintain or improve health. Outcome: Completed/Met Date Met:  10/19/13  RD consulted for nutrition education regarding diabetes.     Lab Results  Component Value Date    HGBA1C 9.8* 10/18/2013    RD provided "Carbohydrate Counting for People with Diabetes" handout from the Academy of Nutrition and Dietetics. Discussed different food groups and their effects on blood sugar, emphasizing carbohydrate-containing foods. Provided list of carbohydrates and recommended serving sizes of common foods.  Discussed importance of controlled and consistent carbohydrate intake throughout the day. Provided examples of ways to balance meals/snacks and encouraged intake of high-fiber, whole grain complex carbohydrates. Teach back method used.  Expect fair compliance.  Body mass index is 27.33 kg/(m^2). Pt meets criteria for Overweight based on current BMI.  Current diet order is NPO.  States she's very hungry.   Labs and medications reviewed. No further nutrition interventions warranted at this time.  If additional nutrition issues arise, please re-consult RD.  Arthur Holms, RD, LDN Pager #: [phone] After-Hours Pager #: [phone]

## 2013-10-19 NOTE — Progress Notes (Signed)
Spoke with patient about the new onset of diabetes.  States that she had gestational diabetes. Her grandmother, mother, and 2 sisters have DM. Had blood work checked about 3 weeks ago at the [facility] and no evidence of elevated blood sugar known to patient at that time.  Has been having frequent thirst and urination the last week. Is on Medicaid and has Dr. Barbaraann Faster as her PCP.  Showed patient the insulin pen and how to use.  Does have an insulin pen starter kit and Living Well with Diabetes booklet in room.  Will be watching DM videos. Staff RNs to teach patient insulin administration and how to check own blood sugars. Dietician to see patient.   Will continue to follow while in hospital.  Harvel Ricks RN BSN CDE

## 2013-10-20 DIAGNOSIS — E876 Hypokalemia: Secondary | ICD-10-CM

## 2013-10-20 LAB — GLUCOSE, CAPILLARY
GLUCOSE-CAPILLARY: 135 mg/dL — AB (ref 70–99)
GLUCOSE-CAPILLARY: 145 mg/dL — AB (ref 70–99)
GLUCOSE-CAPILLARY: 151 mg/dL — AB (ref 70–99)
GLUCOSE-CAPILLARY: 153 mg/dL — AB (ref 70–99)
GLUCOSE-CAPILLARY: 166 mg/dL — AB (ref 70–99)
GLUCOSE-CAPILLARY: 171 mg/dL — AB (ref 70–99)
GLUCOSE-CAPILLARY: 176 mg/dL — AB (ref 70–99)
GLUCOSE-CAPILLARY: 177 mg/dL — AB (ref 70–99)
GLUCOSE-CAPILLARY: 185 mg/dL — AB (ref 70–99)
GLUCOSE-CAPILLARY: 214 mg/dL — AB (ref 70–99)
Glucose-Capillary: 109 mg/dL — ABNORMAL HIGH (ref 70–99)
Glucose-Capillary: 142 mg/dL — ABNORMAL HIGH (ref 70–99)
Glucose-Capillary: 144 mg/dL — ABNORMAL HIGH (ref 70–99)
Glucose-Capillary: 148 mg/dL — ABNORMAL HIGH (ref 70–99)
Glucose-Capillary: 152 mg/dL — ABNORMAL HIGH (ref 70–99)
Glucose-Capillary: 154 mg/dL — ABNORMAL HIGH (ref 70–99)
Glucose-Capillary: 160 mg/dL — ABNORMAL HIGH (ref 70–99)
Glucose-Capillary: 171 mg/dL — ABNORMAL HIGH (ref 70–99)
Glucose-Capillary: 174 mg/dL — ABNORMAL HIGH (ref 70–99)
Glucose-Capillary: 175 mg/dL — ABNORMAL HIGH (ref 70–99)
Glucose-Capillary: 176 mg/dL — ABNORMAL HIGH (ref 70–99)
Glucose-Capillary: 176 mg/dL — ABNORMAL HIGH (ref 70–99)
Glucose-Capillary: 202 mg/dL — ABNORMAL HIGH (ref 70–99)

## 2013-10-20 LAB — BASIC METABOLIC PANEL
BUN: <3 mg/dL — ABNORMAL LOW (ref 6–23)
BUN: <3 mg/dL — ABNORMAL LOW (ref 6–23)
BUN: <3 mg/dL — ABNORMAL LOW (ref 6–23)
BUN: 3 mg/dL — ABNORMAL LOW (ref 6–23)
BUN: <3 mg/dL — ABNORMAL LOW (ref 6–23)
BUN: <3 mg/dL — ABNORMAL LOW (ref 6–23)
CALCIUM: 8.2 mg/dL — AB (ref 8.4–10.5)
CALCIUM: 8.6 mg/dL (ref 8.4–10.5)
CHLORIDE: 105 meq/L (ref 96–112)
CHLORIDE: 107 meq/L (ref 96–112)
CHLORIDE: 107 meq/L (ref 96–112)
CHLORIDE: 109 meq/L (ref 96–112)
CHLORIDE: 115 meq/L — AB (ref 96–112)
CO2: 17 mEq/L — ABNORMAL LOW (ref 19–32)
CO2: 17 meq/L — AB (ref 19–32)
CO2: 18 mEq/L — ABNORMAL LOW (ref 19–32)
CO2: 18 mEq/L — ABNORMAL LOW (ref 19–32)
CO2: 18 mEq/L — ABNORMAL LOW (ref 19–32)
CO2: 18 mEq/L — ABNORMAL LOW (ref 19–32)
CO2: 19 mEq/L (ref 19–32)
CO2: 19 meq/L (ref 19–32)
Calcium: 8.4 mg/dL (ref 8.4–10.5)
Calcium: 8.2 mg/dL — ABNORMAL LOW (ref 8.4–10.5)
Calcium: 8.6 mg/dL (ref 8.4–10.5)
Calcium: 8.1 mg/dL — ABNORMAL LOW (ref 8.4–10.5)
Calcium: 8.3 mg/dL — ABNORMAL LOW (ref 8.4–10.5)
Calcium: 8.4 mg/dL (ref 8.4–10.5)
Chloride: 110 mEq/L (ref 96–112)
Chloride: 109 mEq/L (ref 96–112)
Chloride: 106 mEq/L (ref 96–112)
Creatinine, Ser: 0.55 mg/dL (ref 0.50–1.10)
Creatinine, Ser: 0.6 mg/dL (ref 0.50–1.10)
Creatinine, Ser: 0.59 mg/dL (ref 0.50–1.10)
Creatinine, Ser: 0.6 mg/dL (ref 0.50–1.10)
Creatinine, Ser: 0.59 mg/dL (ref 0.50–1.10)
Creatinine, Ser: 0.61 mg/dL (ref 0.50–1.10)
Creatinine, Ser: 0.57 mg/dL (ref 0.50–1.10)
Creatinine, Ser: 0.54 mg/dL (ref 0.50–1.10)
GFR calc Af Amer: >90 mL/min (ref >90)
GFR calc Af Amer: >90 mL/min (ref >90)
GFR calc Af Amer: >90 mL/min (ref >90)
GFR calc Af Amer: >90 mL/min (ref >90)
GFR calc Af Amer: >90 mL/min (ref >90)
GFR calc non Af Amer: >90 mL/min (ref >90)
GFR calc non Af Amer: >90 mL/min (ref >90)
GFR calc non Af Amer: >90 mL/min (ref >90)
GFR calc non Af Amer: >90 mL/min (ref >90)
GLUCOSE: 137 mg/dL — AB (ref 70–99)
GLUCOSE: 160 mg/dL — AB (ref 70–99)
GLUCOSE: 177 mg/dL — AB (ref 70–99)
Glucose, Bld: 183 mg/dL — ABNORMAL HIGH (ref 70–99)
Glucose, Bld: 103 mg/dL — ABNORMAL HIGH (ref 70–99)
Glucose, Bld: 142 mg/dL — ABNORMAL HIGH (ref 70–99)
Glucose, Bld: 178 mg/dL — ABNORMAL HIGH (ref 70–99)
Glucose, Bld: 159 mg/dL — ABNORMAL HIGH (ref 70–99)
POTASSIUM: 3.4 meq/L — AB (ref 3.7–5.3)
POTASSIUM: 3.9 meq/L (ref 3.7–5.3)
Potassium: 3.3 mEq/L — ABNORMAL LOW (ref 3.7–5.3)
Potassium: 3.6 mEq/L — ABNORMAL LOW (ref 3.7–5.3)
Potassium: 3.5 mEq/L — ABNORMAL LOW (ref 3.7–5.3)
Potassium: 3.4 mEq/L — ABNORMAL LOW (ref 3.7–5.3)
Potassium: 3.3 mEq/L — ABNORMAL LOW (ref 3.7–5.3)
Potassium: 3.9 mEq/L (ref 3.7–5.3)
SODIUM: 143 meq/L (ref 137–147)
SODIUM: 145 meq/L (ref 137–147)
SODIUM: 139 meq/L (ref 137–147)
SODIUM: 141 meq/L (ref 137–147)
Sodium: 141 mEq/L (ref 137–147)
Sodium: 142 mEq/L (ref 137–147)
Sodium: 141 mEq/L (ref 137–147)
Sodium: 140 mEq/L (ref 137–147)

## 2013-10-20 LAB — CBC
HEMATOCRIT: 31.8 % — AB (ref 36.0–46.0)
Hemoglobin: 10.9 g/dL — ABNORMAL LOW (ref 12.0–15.0)
MCH: 29.6 pg (ref 26.0–34.0)
MCHC: 34.3 g/dL (ref 30.0–36.0)
MCV: 86.4 fL (ref 78.0–100.0)
Platelets: 147 10*3/uL — ABNORMAL LOW (ref 150–400)
RBC: 3.68 MIL/uL — ABNORMAL LOW (ref 3.87–5.11)
RDW: 14.7 % (ref 11.5–15.5)
WBC: 14.9 10*3/uL — ABNORMAL HIGH (ref 4.0–10.5)

## 2013-10-20 LAB — MAGNESIUM: MAGNESIUM: 1.5 mg/dL (ref 1.5–2.5)

## 2013-10-20 LAB — LIPASE, BLOOD: Lipase: 242 U/L — ABNORMAL HIGH (ref 11–59)

## 2013-10-20 MED ORDER — SODIUM CHLORIDE 0.9 % IV BOLUS (SEPSIS)
1000.0000 mL | Freq: Once | INTRAVENOUS | Status: AC
  Administered 2013-10-20: 1000 mL via INTRAVENOUS

## 2013-10-20 MED ORDER — POTASSIUM CHLORIDE CRYS ER 20 MEQ PO TBCR
40.0000 meq | EXTENDED_RELEASE_TABLET | ORAL | Status: AC
  Administered 2013-10-20 (×2): 40 meq via ORAL
  Filled 2013-10-20 (×2): qty 2

## 2013-10-20 MED ORDER — MAGNESIUM SULFATE 4000MG/100ML IJ SOLN
4.0000 g | Freq: Once | INTRAMUSCULAR | Status: AC
  Administered 2013-10-20: 4 g via INTRAVENOUS
  Filled 2013-10-20: qty 100

## 2013-10-20 NOTE — Progress Notes (Signed)
[facility] PROGRESS NOTE  Stacy Pierce ZOX:[id number] DOB: 08-26-1970 DOA: 10/17/2013 PCP: Bobbye Riggs, NP  Assessment/Plan: #1 DKA/New onset DM Questionable etiology. Patient new-onset DM. Maybe secondary to acute pancreatitis. Patient with prior hx of ETOH abuse and states last drink was in March. Patient denies any chest pain. No shortness of breath. KUB negative. Cardiac enzymes negative x 3. EKG with sinus tachycardia. Patient still has an anion gap of 15. Patient is acidotic with a bicarbonate of 18. Continue serial BMETs until anion gap is closed and bicarb > 20, then transitioned to subcutaneous insulin. Give a bolus of normal saline 1 L times one now. Continue IV fluids at 150 cc an hour. Hemoglobin A1c is 9.8. Continue supportive care.  #2 hyperlipidemia/hypertriglyceridemia anemia Patient's triglyceride level is 939. Continue statin and TriCor. Follow.  #3 Acute pancreatitis/ epig abd pain Lipase level elevated at 1904 and trending down, was 570 yesterday and 242 today. Clinical improvement. KUB neg. Continue IVF. Supportive care. Start clear liquids.  #4 Hypokalemia Secondary to #1. Replete.  #5 depression Stable. Continue seroquel.  #6 COPD Stable. Continue Spiriva and dulera.  #7 tobacco abuse Tobacco cessation. Continue nicotine patch.  #8 leukocytosis Likely reactive leukocytosis secondary to problem #1. Urinalysis is negative. Chest x-ray is negative. WBC trending down. Afebrile. Follow.  #9 prophylaxis PPI for GI prophylaxis. Heparin for DVT prophylaxis.  Code Status: Full Family Communication: Updated patient no family at bedside. Disposition Plan: Remaining step down today.   Consultants:  None  Procedures:  Chest x-ray 10/17/2013  Abd xray 10/18/13  Antibiotics:  None  HPI/Subjective: Patient denies nausea and emesis. Patient stating she's hungry. Patient wants to eat. Patient denies any chest pain. Patient c/o left leg pain and spasms  however improving.  Objective: Filed Vitals:   10/20/13 0500  BP: 118/76  Pulse: 106  Temp:   Resp: 14    Intake/Output Summary (Last 24 hours) at 10/20/13 0822 Last data filed at 10/20/13 0500  Gross per 24 hour  Intake 3224.27 ml  Output   4425 ml  Net -1200.73 ml   Filed Weights   10/18/13 0015 10/19/13 0411 10/20/13 0250  Weight: 67.8 kg (149 lb 7.6 oz) 67.8 kg (149 lb 7.6 oz) 70.2 kg (154 lb 12.2 oz)    Exam:   General:  NAD  Cardiovascular: RRR  Respiratory: CTAB  Abdomen: sOFT/nd/ nttp, +BS  Musculoskeletal: No c/c/e  Data Reviewed: Basic Metabolic Panel:  Recent Labs Lab 10/19/13 1610 10/19/13 1915 10/19/13 2150 10/19/13 2230 10/20/13 0020 10/20/13 0330  NA 142 145 144 141 141 143  K 3.8 3.5* 3.2* 3.3* 3.3* 3.6*  CL 110 111 111 108 109 110  CO2 16* 18* 19 18* 18* 18*  GLUCOSE 124* 171* 178* 191* 183* 137*  BUN 3* <3* <3* <3* <3* <3*  CREATININE 0.58 0.58 0.62 0.57 0.55 0.60  CALCIUM 7.7* 8.2* 8.5 8.1* 8.2* 8.4  MG  --  1.6  --   --   --   --    Liver Function Tests:  Recent Labs Lab 10/18/13 0052 10/18/13 1421  AST 20 25  ALT 31 27  ALKPHOS 112 101  BILITOT 0.2* 0.4  PROT 6.9 6.3  ALBUMIN 3.3* 2.8*    Recent Labs Lab 10/18/13 0930 10/19/13 0240 10/20/13 0330  LIPASE 1904* 570* 242*   No results found for this basename: AMMONIA,  in the last 168 hours CBC:  Recent Labs Lab 10/17/13 1654 10/17/13 2024 10/18/13 0052 10/19/13 0240  10/20/13 0330  WBC 20.5* 19.9* 14.8* 13.4* 14.9*  HGB 16.3* 17.3* 13.5 11.9* 10.9*  HCT 46.7* 49.7* 39.3 34.6* 31.8*  MCV 88.6 89.7 87.9 87.2 86.4  PLT 223 182 179 154 147*   Cardiac Enzymes:  Recent Labs Lab 10/18/13 0930 10/18/13 1433 10/18/13 2222  TROPONINI <0.30 <0.30 <0.30   BNP (last 3 results) No results found for this basename: PROBNP,  in the last 8760 hours CBG:  Recent Labs Lab 10/19/13 2017 10/19/13 2121 10/20/13 0026 10/20/13 0249 10/20/13 0737  GLUCAP 172*  180* 176* 145* 177*    Recent Results (from the past 240 hour(s))  RAPID STREP SCREEN     Status: None   Collection Time    10/16/13  6:48 AM      Result Value Ref Range Status   Streptococcus, Group A Screen (Direct) NEGATIVE  NEGATIVE Final   Comment: (NOTE)     A Rapid Antigen test may result negative if the antigen level in the     sample is below the detection level of this test. The FDA has not     cleared this test as a stand-alone test therefore the rapid antigen     negative result has reflexed to a Group A Strep culture.  CULTURE, GROUP A STREP     Status: None   Collection Time    10/16/13  6:48 AM      Result Value Ref Range Status   Specimen Description THROAT   Final   Special Requests ADDED 0715   Final   Culture     Final   Value: No Beta Hemolytic Streptococci Isolated     Performed at [facility]   Report Status 10/18/2013 FINAL   Final  MRSA PCR SCREENING     Status: None   Collection Time    10/17/13 11:50 PM      Result Value Ref Range Status   MRSA by PCR NEGATIVE  NEGATIVE Final   Comment:            The GeneXpert MRSA Assay (FDA     approved for NASAL specimens     only), is one component of a     comprehensive MRSA colonization     surveillance program. It is not     intended to diagnose MRSA     infection nor to guide or     monitor treatment for     MRSA infections.     Studies: Dg Abd 1 View  10/18/2013   CLINICAL DATA:  Abdominal pain and nausea  EXAM: ABDOMEN - 1 VIEW  COMPARISON:  None.  FINDINGS: Bowel gas pattern is unremarkable. No obstruction or free air is seen on this supine examination. There are several 1 mm calculi in the mid to lower pole right kidney. There is an intrauterine device in the mid-pelvis. There are apparent phleboliths in the pelvis. There are surgical clips in the right upper quadrant region.  IMPRESSION: Bowel gas pattern unremarkable.  Intrauterine device in mid pelvis.   Electronically Signed   By: Bretta Bang M.D.   On: 10/18/2013 12:02    Scheduled Meds: . atorvastatin  40 mg Oral q1800  . fenofibrate  160 mg Oral Daily  . heparin  5,000 Units Subcutaneous 3 times per day  . mometasone-formoterol  2 puff Inhalation BID  . nicotine  21 mg Transdermal Daily  . pantoprazole (PROTONIX) IV  40 mg Intravenous Q24H  . QUEtiapine  400  mg Oral QHS  . sodium chloride  3 mL Intravenous Q12H  . tiotropium  18 mcg Inhalation Daily   Continuous Infusions: . sodium chloride 1,000 mL (10/17/13 2207)  . sodium chloride Stopped (10/19/13 2137)  . dextrose 5 % and 0.45% NaCl Stopped (10/19/13 2138)  . dextrose 5 % and 0.45% NaCl 150 mL/hr at 10/20/13 0500  . insulin (NOVOLIN-R) infusion 3.5 Units/hr (10/20/13 0740)    Principal Problem:   DKA (diabetic ketoacidoses) Active Problems:   Pancreatitis, acute   TOBACCO ABUSE   DEPRESSIVE DISORDER NOT ELSEWHERE CLASSIFIED   Other and unspecified hyperlipidemia   Hypertriglyceridemia   Leukocytosis, unspecified   Abdominal pain, epigastric   Hypokalemia    Time spent: 40 MINS    Anne Fuaniel V ThompsonMD [facility] Pager [phone]. If 7PM-7AM, please contact night-coverage at [email or web address], password [email or web address] 10/20/2013, 8:22 AM  LOS: 3 days

## 2013-10-20 NOTE — Care Management Note (Signed)
    Page 1 of 1   10/20/2013     10:12:02 AM   CARE MANAGEMENT NOTE 10/20/2013  Patient:  Rock NephewSTARNES,Shadaya R   Account Number:  [id number]  Date Initiated:  10/20/2013  Documentation initiated by:  Blayklee Mable  Subjective/Objective Assessment:   dx DKA/new dx of DM; lives with spouse and 3 children    PCP  Dr Lendell CapriceSandy Mand @ [facility]     DC Planning Services  Outpatient Services - Pt will follow up  CM consult      Per UR Regulation:  Reviewed for med. necessity/level of care/duration of stay  Comments:  10/20/13 1004 Jermia Rigsby RN MSN BSN CCM Pt gets medications filled @ [facility] and has a [employer] surveyorMedicaid waiver so has no copay for medications on the preferred list.  Lantus Solostar and Novolog Flexpen are on [facility] preferred list.  Pt is also interested in f/u @ [facility].  Diabetes Coordinator, Smith MinceKendra Martin, will place order and pt will be able to schedule transportation through [facility].

## 2013-10-21 ENCOUNTER — Encounter (HOSPITAL_COMMUNITY): Payer: Self-pay | Admitting: *Deleted

## 2013-10-21 ENCOUNTER — Inpatient Hospital Stay (HOSPITAL_COMMUNITY): Payer: Medicaid Other

## 2013-10-21 DIAGNOSIS — R509 Fever, unspecified: Secondary | ICD-10-CM

## 2013-10-21 LAB — BASIC METABOLIC PANEL
BUN: <3 mg/dL — ABNORMAL LOW (ref 6–23)
BUN: 3 mg/dL — AB (ref 6–23)
BUN: 4 mg/dL — AB (ref 6–23)
BUN: 4 mg/dL — ABNORMAL LOW (ref 6–23)
BUN: 5 mg/dL — ABNORMAL LOW (ref 6–23)
CHLORIDE: 108 meq/L (ref 96–112)
CHLORIDE: 108 meq/L (ref 96–112)
CHLORIDE: 103 meq/L (ref 96–112)
CO2: 17 meq/L — AB (ref 19–32)
CO2: 17 meq/L — AB (ref 19–32)
CO2: 19 mEq/L (ref 19–32)
CO2: 19 mEq/L (ref 19–32)
CO2: 19 meq/L (ref 19–32)
CO2: 19 meq/L (ref 19–32)
CREATININE: 0.55 mg/dL (ref 0.50–1.10)
CREATININE: 0.59 mg/dL (ref 0.50–1.10)
CREATININE: 0.63 mg/dL (ref 0.50–1.10)
CREATININE: 0.66 mg/dL (ref 0.50–1.10)
Calcium: 8.3 mg/dL — ABNORMAL LOW (ref 8.4–10.5)
Calcium: 8.3 mg/dL — ABNORMAL LOW (ref 8.4–10.5)
Calcium: 8.3 mg/dL — ABNORMAL LOW (ref 8.4–10.5)
Calcium: 8.4 mg/dL (ref 8.4–10.5)
Calcium: 8.5 mg/dL (ref 8.4–10.5)
Calcium: 9.1 mg/dL (ref 8.4–10.5)
Chloride: 109 mEq/L (ref 96–112)
Chloride: 103 mEq/L (ref 96–112)
Chloride: 104 mEq/L (ref 96–112)
Creatinine, Ser: 0.66 mg/dL (ref 0.50–1.10)
Creatinine, Ser: 0.58 mg/dL (ref 0.50–1.10)
GFR calc Af Amer: >90 mL/min (ref >90)
GFR calc Af Amer: >90 mL/min (ref >90)
GFR calc Af Amer: >90 mL/min (ref >90)
GFR calc Af Amer: >90 mL/min (ref >90)
GFR calc Af Amer: >90 mL/min (ref >90)
GFR calc non Af Amer: >90 mL/min (ref >90)
GFR calc non Af Amer: >90 mL/min (ref >90)
GFR calc non Af Amer: >90 mL/min (ref >90)
GFR calc non Af Amer: >90 mL/min (ref >90)
GFR calc non Af Amer: >90 mL/min (ref >90)
GLUCOSE: 184 mg/dL — AB (ref 70–99)
GLUCOSE: 141 mg/dL — AB (ref 70–99)
GLUCOSE: 153 mg/dL — AB (ref 70–99)
Glucose, Bld: 190 mg/dL — ABNORMAL HIGH (ref 70–99)
Glucose, Bld: 218 mg/dL — ABNORMAL HIGH (ref 70–99)
Glucose, Bld: 152 mg/dL — ABNORMAL HIGH (ref 70–99)
POTASSIUM: 4 meq/L (ref 3.7–5.3)
POTASSIUM: 3.8 meq/L (ref 3.7–5.3)
Potassium: 4 mEq/L (ref 3.7–5.3)
Potassium: 4.2 mEq/L (ref 3.7–5.3)
Potassium: 3.6 mEq/L — ABNORMAL LOW (ref 3.7–5.3)
Potassium: 4 mEq/L (ref 3.7–5.3)
SODIUM: 141 meq/L (ref 137–147)
SODIUM: 138 meq/L (ref 137–147)
Sodium: 137 mEq/L (ref 137–147)
Sodium: 137 mEq/L (ref 137–147)
Sodium: 141 mEq/L (ref 137–147)
Sodium: 142 mEq/L (ref 137–147)

## 2013-10-21 LAB — GLUCOSE, CAPILLARY
GLUCOSE-CAPILLARY: 121 mg/dL — AB (ref 70–99)
GLUCOSE-CAPILLARY: 172 mg/dL — AB (ref 70–99)
GLUCOSE-CAPILLARY: 184 mg/dL — AB (ref 70–99)
Glucose-Capillary: 162 mg/dL — ABNORMAL HIGH (ref 70–99)
Glucose-Capillary: 146 mg/dL — ABNORMAL HIGH (ref 70–99)
Glucose-Capillary: 136 mg/dL — ABNORMAL HIGH (ref 70–99)
Glucose-Capillary: 133 mg/dL — ABNORMAL HIGH (ref 70–99)
Glucose-Capillary: 156 mg/dL — ABNORMAL HIGH (ref 70–99)
Glucose-Capillary: 173 mg/dL — ABNORMAL HIGH (ref 70–99)
Glucose-Capillary: 172 mg/dL — ABNORMAL HIGH (ref 70–99)
Glucose-Capillary: 248 mg/dL — ABNORMAL HIGH (ref 70–99)
Glucose-Capillary: 161 mg/dL — ABNORMAL HIGH (ref 70–99)
Glucose-Capillary: 142 mg/dL — ABNORMAL HIGH (ref 70–99)
Glucose-Capillary: 76 mg/dL (ref 70–99)
Glucose-Capillary: 147 mg/dL — ABNORMAL HIGH (ref 70–99)

## 2013-10-21 LAB — URINALYSIS, ROUTINE W REFLEX MICROSCOPIC
BILIRUBIN URINE: NEGATIVE
GLUCOSE, UA: 100 mg/dL — AB
Hgb urine dipstick: NEGATIVE
Ketones, ur: NEGATIVE mg/dL
Leukocytes, UA: NEGATIVE
Nitrite: NEGATIVE
PH: 7 (ref 5.0–8.0)
PROTEIN: 30 mg/dL — AB
Specific Gravity, Urine: 1.012 (ref 1.005–1.030)
Urobilinogen, UA: 1 mg/dL (ref 0.0–1.0)

## 2013-10-21 LAB — CBC
HEMATOCRIT: 29.3 % — AB (ref 36.0–46.0)
HEMOGLOBIN: 10.4 g/dL — AB (ref 12.0–15.0)
MCH: 30.8 pg (ref 26.0–34.0)
MCHC: 35.5 g/dL (ref 30.0–36.0)
MCV: 86.7 fL (ref 78.0–100.0)
Platelets: 187 10*3/uL (ref 150–400)
RBC: 3.38 MIL/uL — ABNORMAL LOW (ref 3.87–5.11)
RDW: 15.1 % (ref 11.5–15.5)
WBC: 14.5 10*3/uL — AB (ref 4.0–10.5)

## 2013-10-21 LAB — URINE MICROSCOPIC-ADD ON

## 2013-10-21 LAB — LIPASE, BLOOD: Lipase: 444 U/L — ABNORMAL HIGH (ref 11–59)

## 2013-10-21 LAB — MAGNESIUM: MAGNESIUM: 1.9 mg/dL (ref 1.5–2.5)

## 2013-10-21 MED ORDER — IOHEXOL 300 MG/ML  SOLN
100.0000 mL | Freq: Once | INTRAMUSCULAR | Status: AC | PRN
  Administered 2013-10-21: 100 mL via INTRAVENOUS

## 2013-10-21 MED ORDER — IOHEXOL 300 MG/ML  SOLN
25.0000 mL | INTRAMUSCULAR | Status: AC
  Administered 2013-10-21 (×2): 25 mL via ORAL

## 2013-10-21 MED ORDER — POTASSIUM CHLORIDE CRYS ER 20 MEQ PO TBCR
20.0000 meq | EXTENDED_RELEASE_TABLET | Freq: Once | ORAL | Status: AC
  Administered 2013-10-21: 20 meq via ORAL
  Filled 2013-10-21: qty 1

## 2013-10-21 MED ORDER — SODIUM BICARBONATE 650 MG PO TABS
650.0000 mg | ORAL_TABLET | Freq: Two times a day (BID) | ORAL | Status: DC
  Administered 2013-10-21 (×2): 650 mg via ORAL
  Filled 2013-10-21 (×4): qty 1

## 2013-10-21 MED ORDER — SODIUM CHLORIDE 0.9 % IV SOLN
500.0000 mg | Freq: Four times a day (QID) | INTRAVENOUS | Status: DC
  Administered 2013-10-21 – 2013-10-25 (×18): 500 mg via INTRAVENOUS
  Filled 2013-10-21 (×21): qty 500

## 2013-10-21 NOTE — Progress Notes (Signed)
[facility] PROGRESS NOTE  Stacy Pierce OZH:[id number] DOB: 1970/08/16 DOA: 10/17/2013 PCP: Bobbye Riggs, NP  Assessment/Plan: #1 DKA/New onset DM Questionable etiology. Patient new-onset DM. Maybe secondary to acute pancreatitis. Patient with prior hx of ETOH abuse and states last drink was in March. Patient denies any chest pain. No shortness of breath. KUB negative. Cardiac enzymes negative x 3. EKG with sinus tachycardia. Patient still has an anion gap of 16. Patient is acidotic with a bicarbonate of 17. Continue serial BMETs until anion gap is closed and bicarb > 20, then transitioned to subcutaneous insulin. Continue IV fluids at 150 cc an hour. Hemoglobin A1c is 9.8. Continue supportive care.  #2 hyperlipidemia/hypertriglyceridemia anemia Patient's triglyceride level is 939. Continue statin and TriCor. Follow.  #3 Acute pancreatitis/ epig abd pain Lipase level elevated at 1904 and trending down, was 570  2 days ago, and 242 yesterday. Clinical improvement. KUB neg. Continue IVF. Supportive care. Tolerating clear liquids. Patient noted to have fevers and chills overnight. Check CT abd and pelvis. Repeat lipase levels.If CT neg will advance diet.  #4. Fever ?? Etiology. CXR neg. UA neg. Check blood cultures x 2. Check CT abd pelvis. Patient started on primaxin last night. Follow.  #5 Hypokalemia Secondary to #1. Repleted.  #6 depression Stable. Continue seroquel.  #7 COPD Stable. Continue Spiriva and dulera.  #8 tobacco abuse Tobacco cessation. Continue nicotine patch.  #9 leukocytosis Likely reactive leukocytosis secondary to problem #1. Urinalysis is negative. Chest x-ray is negative. Patient noted to have fevers overnight. Will check CT abd pelvis. Patient started on IV Primaxin. Afebrile. Follow.  #10 prophylaxis PPI for GI prophylaxis. Heparin for DVT prophylaxis.  Code Status: Full Family Communication: Updated patient no family at bedside. Disposition Plan:  Remaining step down today.   Consultants:  None  Procedures:  Chest x-ray 10/17/2013, 10/21/13  Abd xray 10/18/13  Antibiotics:  IV Primaxin 10/21/13  HPI/Subjective: Patient denies nausea and emesis. Patient stating she's hungry. Patient wants to eat. Patient denies any chest pain. Patient states left leg pain and spasms improving. Patient with fever and chills overnight, and started on IV Primaxin.  Objective: Filed Vitals:   10/21/13 0800  BP: 129/71  Pulse: 106  Temp: 99.9 F (37.7 C)  Resp: 31    Intake/Output Summary (Last 24 hours) at 10/21/13 1017 Last data filed at 10/21/13 0700  Gross per 24 hour  Intake 5703.84 ml  Output    800 ml  Net 4903.84 ml   Filed Weights   10/18/13 0015 10/19/13 0411 10/20/13 0250  Weight: 67.8 kg (149 lb 7.6 oz) 67.8 kg (149 lb 7.6 oz) 70.2 kg (154 lb 12.2 oz)    Exam:   General:  NAD  Cardiovascular: RRR  Respiratory: CTAB  Abdomen: sOFT/mild distension/ nttp, +BS  Musculoskeletal: No c/c/e  Data Reviewed: Basic Metabolic Panel:  Recent Labs Lab 10/19/13 1610 10/19/13 1915  10/20/13 0921  10/20/13 1848 10/20/13 2035 10/20/13 2244 10/21/13 0030 10/21/13 0459  NA 142 145  < >  --   < > 140 141 142 141 141  K 3.8 3.5*  < >  --   < > 3.9 3.9 4.0 4.0 4.2  CL 110 111  < >  --   < > 106 107 108 109 108  CO2 16* 18*  < >  --   < > 19 18* 19 19 17*  GLUCOSE 124* 171*  < >  --   < > 177* 159* 190* 184*  141*  BUN 3* <3*  < >  --   < > <3* <3* <3* <3* 3*  CREATININE 0.58 0.58  < >  --   < > 0.57 0.54 0.55 0.59 0.66  CALCIUM 7.7* 8.2*  < >  --   < > 8.3* 8.4 8.4 8.3* 8.3*  MG  --  1.6  --  1.5  --   --   --   --   --   --   < > = values in this interval not displayed. Liver Function Tests:  Recent Labs Lab 10/18/13 0052 10/18/13 1421  AST 20 25  ALT 31 27  ALKPHOS 112 101  BILITOT 0.2* 0.4  PROT 6.9 6.3  ALBUMIN 3.3* 2.8*    Recent Labs Lab 10/18/13 0930 10/19/13 0240 10/20/13 0330  LIPASE 1904*  570* 242*   No results found for this basename: AMMONIA,  in the last 168 hours CBC:  Recent Labs Lab 10/17/13 2024 10/18/13 0052 10/19/13 0240 10/20/13 0330 10/21/13 0459  WBC 19.9* 14.8* 13.4* 14.9* 14.5*  HGB 17.3* 13.5 11.9* 10.9* 10.4*  HCT 49.7* 39.3 34.6* 31.8* 29.3*  MCV 89.7 87.9 87.2 86.4 86.7  PLT 182 179 154 147* 187   Cardiac Enzymes:  Recent Labs Lab 10/18/13 0930 10/18/13 1433 10/18/13 2222  TROPONINI <0.30 <0.30 <0.30   BNP (last 3 results) No results found for this basename: PROBNP,  in the last 8760 hours CBG:  Recent Labs Lab 10/21/13 0529 10/21/13 0639 10/21/13 0738 10/21/13 0836 10/21/13 0945  GLUCAP 156* 173* 172* 172* 248*    Recent Results (from the past 240 hour(s))  RAPID STREP SCREEN     Status: None   Collection Time    10/16/13  6:48 AM      Result Value Ref Range Status   Streptococcus, Group A Screen (Direct) NEGATIVE  NEGATIVE Final   Comment: (NOTE)     A Rapid Antigen test may result negative if the antigen level in the     sample is below the detection level of this test. The FDA has not     cleared this test as a stand-alone test therefore the rapid antigen     negative result has reflexed to a Group A Strep culture.  CULTURE, GROUP A STREP     Status: None   Collection Time    10/16/13  6:48 AM      Result Value Ref Range Status   Specimen Description THROAT   Final   Special Requests ADDED 0715   Final   Culture     Final   Value: No Beta Hemolytic Streptococci Isolated     Performed at [facility]   Report Status 10/18/2013 FINAL   Final  MRSA PCR SCREENING     Status: None   Collection Time    10/17/13 11:50 PM      Result Value Ref Range Status   MRSA by PCR NEGATIVE  NEGATIVE Final   Comment:            The GeneXpert MRSA Assay (FDA     approved for NASAL specimens     only), is one component of a     comprehensive MRSA colonization     surveillance program. It is not     intended to diagnose  MRSA     infection nor to guide or     monitor treatment for     MRSA infections.  Studies: Dg Chest Port 1 View  10/21/2013   CLINICAL DATA:  Fever.  EXAM: PORTABLE CHEST - 1 VIEW  COMPARISON:  10/17/2013  FINDINGS: Heart, mediastinum hila unremarkable.  There is minor mid lung zone reticular type opacity that is most likely atelectasis accentuated by relatively low lung volumes and the semi-erect positioning. There is no convincing infiltrate. No pulmonary edema. No pleural effusion or pneumothorax.  IMPRESSION: No acute cardiopulmonary disease.   Electronically Signed   By: Amie Portland M.D.   On: 10/21/2013 09:26    Scheduled Meds: . atorvastatin  40 mg Oral q1800  . fenofibrate  160 mg Oral Daily  . heparin  5,000 Units Subcutaneous 3 times per day  . imipenem-cilastatin  500 mg Intravenous 4 times per day  . mometasone-formoterol  2 puff Inhalation BID  . nicotine  21 mg Transdermal Daily  . pantoprazole (PROTONIX) IV  40 mg Intravenous Q24H  . QUEtiapine  400 mg Oral QHS  . sodium bicarbonate  650 mg Oral BID  . sodium chloride  3 mL Intravenous Q12H  . tiotropium  18 mcg Inhalation Daily   Continuous Infusions: . dextrose 5 % and 0.45% NaCl 125 mL/hr at 10/21/13 0645  . insulin (NOVOLIN-R) infusion 3.4 Units/hr (10/21/13 1610)    Principal Problem:   DKA (diabetic ketoacidoses) Active Problems:   Pancreatitis, acute   TOBACCO ABUSE   DEPRESSIVE DISORDER NOT ELSEWHERE CLASSIFIED   Other and unspecified hyperlipidemia   Hypertriglyceridemia   Leukocytosis, unspecified   Abdominal pain, epigastric   Hypokalemia    Time spent: 40 MINS    Anne Fu [facility] Pager [phone]. If 7PM-7AM, please contact night-coverage at [email or web address], password [facility] 10/21/2013, 10:17 AM  LOS: 4 days

## 2013-10-21 NOTE — Progress Notes (Addendum)
43yo female admitted 4/7 w/ SOB, found to possibly have pancreatitis, now w/ fever, to begin IV ABX.  Will start Primaxin 500mg  IV Q6H for wt 70kg and CrCl ~6480ml/min and monitor CBC and Cx.  Vernard GamblesVeronda Livier Hendel, PharmD, BCPS 10/21/2013 5:05 AM

## 2013-10-22 LAB — BASIC METABOLIC PANEL
BUN: 7 mg/dL (ref 6–23)
BUN: 7 mg/dL (ref 6–23)
BUN: 5 mg/dL — ABNORMAL LOW (ref 6–23)
BUN: 4 mg/dL — ABNORMAL LOW (ref 6–23)
CALCIUM: 8.8 mg/dL (ref 8.4–10.5)
CHLORIDE: 102 meq/L (ref 96–112)
CHLORIDE: 98 meq/L (ref 96–112)
CO2: 18 mEq/L — ABNORMAL LOW (ref 19–32)
CO2: 17 mEq/L — ABNORMAL LOW (ref 19–32)
CO2: 20 mEq/L (ref 19–32)
CO2: 21 mEq/L (ref 19–32)
CREATININE: 0.69 mg/dL (ref 0.50–1.10)
Calcium: 8.5 mg/dL (ref 8.4–10.5)
Calcium: 8.2 mg/dL — ABNORMAL LOW (ref 8.4–10.5)
Calcium: 8.4 mg/dL (ref 8.4–10.5)
Chloride: 105 mEq/L (ref 96–112)
Chloride: 103 mEq/L (ref 96–112)
Creatinine, Ser: 0.68 mg/dL (ref 0.50–1.10)
Creatinine, Ser: 0.63 mg/dL (ref 0.50–1.10)
Creatinine, Ser: 0.66 mg/dL (ref 0.50–1.10)
GFR calc Af Amer: >90 mL/min (ref >90)
GFR calc non Af Amer: >90 mL/min (ref >90)
GFR calc non Af Amer: >90 mL/min (ref >90)
GFR calc non Af Amer: >90 mL/min (ref >90)
Glucose, Bld: 156 mg/dL — ABNORMAL HIGH (ref 70–99)
Glucose, Bld: 195 mg/dL — ABNORMAL HIGH (ref 70–99)
Glucose, Bld: 122 mg/dL — ABNORMAL HIGH (ref 70–99)
Glucose, Bld: 203 mg/dL — ABNORMAL HIGH (ref 70–99)
POTASSIUM: 4.1 meq/L (ref 3.7–5.3)
POTASSIUM: 3.6 meq/L — AB (ref 3.7–5.3)
POTASSIUM: 3.8 meq/L (ref 3.7–5.3)
Potassium: 4 mEq/L (ref 3.7–5.3)
SODIUM: 139 meq/L (ref 137–147)
SODIUM: 135 meq/L — AB (ref 137–147)
Sodium: 139 mEq/L (ref 137–147)
Sodium: 138 mEq/L (ref 137–147)

## 2013-10-22 LAB — CBC WITH DIFFERENTIAL/PLATELET
Basophils Absolute: 0 10*3/uL (ref 0.0–0.1)
Basophils Relative: 0 % (ref 0–1)
Eosinophils Absolute: 0.2 10*3/uL (ref 0.0–0.7)
Eosinophils Relative: 1 % (ref 0–5)
HCT: 28.2 % — ABNORMAL LOW (ref 36.0–46.0)
Hemoglobin: 9.6 g/dL — ABNORMAL LOW (ref 12.0–15.0)
LYMPHS ABS: 2.2 10*3/uL (ref 0.7–4.0)
Lymphocytes Relative: 12 % (ref 12–46)
MCH: 29.9 pg (ref 26.0–34.0)
MCHC: 34 g/dL (ref 30.0–36.0)
MCV: 87.9 fL (ref 78.0–100.0)
MONOS PCT: 8 % (ref 3–12)
Monocytes Absolute: 1.5 10*3/uL — ABNORMAL HIGH (ref 0.1–1.0)
NEUTROS PCT: 79 % — AB (ref 43–77)
Neutro Abs: 14.6 10*3/uL — ABNORMAL HIGH (ref 1.7–7.7)
PLATELETS: 173 10*3/uL (ref 150–400)
RBC: 3.21 MIL/uL — AB (ref 3.87–5.11)
RDW: 15 % (ref 11.5–15.5)
WBC: 18.5 10*3/uL — AB (ref 4.0–10.5)

## 2013-10-22 LAB — GLUCOSE, CAPILLARY
GLUCOSE-CAPILLARY: 177 mg/dL — AB (ref 70–99)
GLUCOSE-CAPILLARY: 181 mg/dL — AB (ref 70–99)
GLUCOSE-CAPILLARY: 146 mg/dL — AB (ref 70–99)
GLUCOSE-CAPILLARY: 158 mg/dL — AB (ref 70–99)
GLUCOSE-CAPILLARY: 199 mg/dL — AB (ref 70–99)
Glucose-Capillary: 113 mg/dL — ABNORMAL HIGH (ref 70–99)
Glucose-Capillary: 115 mg/dL — ABNORMAL HIGH (ref 70–99)
Glucose-Capillary: 139 mg/dL — ABNORMAL HIGH (ref 70–99)
Glucose-Capillary: 139 mg/dL — ABNORMAL HIGH (ref 70–99)
Glucose-Capillary: 146 mg/dL — ABNORMAL HIGH (ref 70–99)
Glucose-Capillary: 147 mg/dL — ABNORMAL HIGH (ref 70–99)
Glucose-Capillary: 150 mg/dL — ABNORMAL HIGH (ref 70–99)
Glucose-Capillary: 158 mg/dL — ABNORMAL HIGH (ref 70–99)
Glucose-Capillary: 165 mg/dL — ABNORMAL HIGH (ref 70–99)
Glucose-Capillary: 167 mg/dL — ABNORMAL HIGH (ref 70–99)
Glucose-Capillary: 172 mg/dL — ABNORMAL HIGH (ref 70–99)
Glucose-Capillary: 178 mg/dL — ABNORMAL HIGH (ref 70–99)
Glucose-Capillary: 183 mg/dL — ABNORMAL HIGH (ref 70–99)
Glucose-Capillary: 190 mg/dL — ABNORMAL HIGH (ref 70–99)
Glucose-Capillary: 208 mg/dL — ABNORMAL HIGH (ref 70–99)

## 2013-10-22 LAB — LIPASE, BLOOD: LIPASE: 227 U/L — AB (ref 11–59)

## 2013-10-22 MED ORDER — POTASSIUM CHLORIDE CRYS ER 20 MEQ PO TBCR
20.0000 meq | EXTENDED_RELEASE_TABLET | Freq: Once | ORAL | Status: AC
  Administered 2013-10-22: 20 meq via ORAL
  Filled 2013-10-22: qty 1

## 2013-10-22 MED ORDER — INSULIN ASPART 100 UNIT/ML ~~LOC~~ SOLN
0.0000 [IU] | Freq: Three times a day (TID) | SUBCUTANEOUS | Status: DC
Start: 2013-10-23 — End: 2013-10-26
  Administered 2013-10-23: 2 [IU] via SUBCUTANEOUS
  Administered 2013-10-23: 3 [IU] via SUBCUTANEOUS
  Administered 2013-10-23: 5 [IU] via SUBCUTANEOUS
  Administered 2013-10-24: 2 [IU] via SUBCUTANEOUS
  Administered 2013-10-24: 3 [IU] via SUBCUTANEOUS
  Administered 2013-10-25 – 2013-10-26 (×4): 2 [IU] via SUBCUTANEOUS
  Administered 2013-10-26: 3 [IU] via SUBCUTANEOUS

## 2013-10-22 MED ORDER — PANTOPRAZOLE SODIUM 40 MG PO TBEC
40.0000 mg | DELAYED_RELEASE_TABLET | Freq: Every day | ORAL | Status: DC
  Administered 2013-10-23 – 2013-10-26 (×4): 40 mg via ORAL
  Filled 2013-10-22 (×4): qty 1

## 2013-10-22 MED ORDER — INSULIN GLARGINE 100 UNIT/ML ~~LOC~~ SOLN
15.0000 [IU] | Freq: Every day | SUBCUTANEOUS | Status: DC
  Administered 2013-10-22 – 2013-10-25 (×4): 15 [IU] via SUBCUTANEOUS
  Filled 2013-10-22 (×5): qty 0.15

## 2013-10-22 MED ORDER — SODIUM BICARBONATE 650 MG PO TABS
1300.0000 mg | ORAL_TABLET | Freq: Three times a day (TID) | ORAL | Status: DC
  Administered 2013-10-22 (×3): 1300 mg via ORAL
  Filled 2013-10-22 (×6): qty 2

## 2013-10-22 MED ORDER — SODIUM CHLORIDE 0.9 % IV BOLUS (SEPSIS)
1000.0000 mL | Freq: Once | INTRAVENOUS | Status: AC
  Administered 2013-10-22: 1000 mL via INTRAVENOUS

## 2013-10-22 NOTE — Progress Notes (Signed)
[facility] PROGRESS NOTE  Stacy NephewGail R Pierce UUV:[id number] DOB: 11/24/1970 DOA: 10/17/2013 PCP: [name], [name], NP  Assessment/Plan: #1 DKA/New onset DM Questionable etiology. Patient new-onset DM. Maybe secondary to acute pancreatitis. Patient with prior hx of ETOH abuse and states last drink was in March. Patient denies any chest pain. No shortness of breath. KUB negative. Cardiac enzymes negative x 3. EKG with sinus tachycardia. Patient still has an anion gap of 16. Patient is acidotic with a bicarbonate of 18. Continue serial BMETs until anion gap is closed and bicarb > 20, then transitioned to subcutaneous insulin. Continue IV fluids at 125 cc an hour. Hemoglobin A1c is 9.8. Continue supportive care.  #2 hyperlipidemia/hypertriglyceridemia anemia Patient's triglyceride level is 939. Continue statin and TriCor. Follow.  #3 Acute pancreatitis/ epig abd pain Lipase level elevated at 1904 and trending down to 570  Then to 242 then 444. Clinical improvement. KUB neg. Continue IVF. Supportive care. Tolerating solid diet. CT abd and pelvis with mild acute pancreatitis. Repeat lipase levels pending.  #4. Fever ?? Etiology. CXR neg. UA neg. Blood cultures pending. CT abd pelvis c/w mild acute pancreatitis, neg for abscess. Increased WBC. Continue IV Primaxin. Follow.  #5 Hypokalemia Secondary to #1. Repleted.  #6 depression Stable. Continue seroquel.  #7 COPD Stable. Continue Spiriva and dulera.  #8 tobacco abuse Tobacco cessation. Continue nicotine patch.  #9 leukocytosis Likely reactive leukocytosis secondary to problem #1. Urinalysis is negative. Chest x-ray is negative. Patient afebrile. CT abd pelvis with changes c/w mild acute pancreatitis in tail of pancreas. No abscess. Blood cultures pending. Continue IV Primaxin. Afebrile. Follow.  #10 prophylaxis PPI for GI prophylaxis. Heparin for DVT prophylaxis.  Code Status: Full Family Communication: Updated patient no family at  bedside. Disposition Plan: Remaining step down today.   Consultants:  None  Procedures:  Chest x-ray 10/17/2013, 10/21/13  Abd xray 10/18/13  CT abd/pelvis 10/21/13  Antibiotics:  IV Primaxin 10/21/13  HPI/Subjective: Patient denies nausea and emesis. No abdominal pain. Patient denies any chest pain. Patient states left leg pain and spasms improving. Patient afebrile overnight.   Objective: Filed Vitals:   10/22/13 0800  BP: 111/55  Pulse: 111  Temp: 98.4 F (36.9 C)  Resp: 27    Intake/Output Summary (Last 24 hours) at 10/22/13 0919 Last data filed at 10/22/13 0557  Gross per 24 hour  Intake 457.55 ml  Output    200 ml  Net 257.55 ml   Filed Weights   10/18/13 0015 10/19/13 0411 10/20/13 0250  Weight: 67.8 kg (149 lb 7.6 oz) 67.8 kg (149 lb 7.6 oz) 70.2 kg (154 lb 12.2 oz)    Exam:   General:  NAD  Cardiovascular: RRR  Respiratory: CTAB  Abdomen: sOFT/mild distension/ nttp, +BS  Musculoskeletal: No c/c/e  Data Reviewed: Basic Metabolic Panel:  Recent Labs Lab 10/19/13 1610 10/19/13 1915  10/20/13 0921  10/21/13 0459 10/21/13 0930 10/21/13 1222 10/21/13 2145 10/22/13 0410  NA 142 145  < >  --   < > 141 137 137 138 139  K 3.8 3.5*  < >  --   < > 4.2 3.8 3.6* 4.0 4.0  CL 110 111  < >  --   < > 108 103 103 104 105  CO2 16* 18*  < >  --   < > 17* 17* 19 19 18*  GLUCOSE 124* 171*  < >  --   < > 141* 218* 153* 152* 156*  BUN 3* <3*  < >  --   < >  3* 4* 4* 5* 7  CREATININE 0.58 0.58  < >  --   < > 0.66 0.66 0.63 0.58 0.69  CALCIUM 7.7* 8.2*  < >  --   < > 8.3* 8.3* 8.5 9.1 8.8  MG  --  1.6  --  1.5  --   --   --  1.9  --   --   < > = values in this interval not displayed. Liver Function Tests:  Recent Labs Lab 10/18/13 0052 10/18/13 1421  AST 20 25  ALT 31 27  ALKPHOS 112 101  BILITOT 0.2* 0.4  PROT 6.9 6.3  ALBUMIN 3.3* 2.8*    Recent Labs Lab 10/18/13 0930 10/19/13 0240 10/20/13 0330 10/21/13 1222  LIPASE 1904* 570* 242* 444*    No results found for this basename: AMMONIA,  in the last 168 hours CBC:  Recent Labs Lab 10/18/13 0052 10/19/13 0240 10/20/13 0330 10/21/13 0459 10/22/13 0830  WBC 14.8* 13.4* 14.9* 14.5* 18.5*  NEUTROABS  --   --   --   --  PENDING  HGB 13.5 11.9* 10.9* 10.4* 9.6*  HCT 39.3 34.6* 31.8* 29.3* 28.2*  MCV 87.9 87.2 86.4 86.7 87.9  PLT 179 154 147* 187 173   Cardiac Enzymes:  Recent Labs Lab 10/18/13 0930 10/18/13 1433 10/18/13 2222  TROPONINI <0.30 <0.30 <0.30   BNP (last 3 results) No results found for this basename: PROBNP,  in the last 8760 hours CBG:  Recent Labs Lab 10/22/13 0213 10/22/13 0322 10/22/13 0422 10/22/13 0712 10/22/13 0854  GLUCAP 139* 150* 165* 172* 208*    Recent Results (from the past 240 hour(s))  RAPID STREP SCREEN     Status: None   Collection Time    10/16/13  6:48 AM      Result Value Ref Range Status   Streptococcus, Group A Screen (Direct) NEGATIVE  NEGATIVE Final   Comment: (NOTE)     A Rapid Antigen test may result negative if the antigen level in the     sample is below the detection level of this test. The FDA has not     cleared this test as a stand-alone test therefore the rapid antigen     negative result has reflexed to a Group A Strep culture.  CULTURE, GROUP A STREP     Status: None   Collection Time    10/16/13  6:48 AM      Result Value Ref Range Status   Specimen Description THROAT   Final   Special Requests ADDED 0715   Final   Culture     Final   Value: No Beta Hemolytic Streptococci Isolated     Performed at [facility]   Report Status 10/18/2013 FINAL   Final  MRSA PCR SCREENING     Status: None   Collection Time    10/17/13 11:50 PM      Result Value Ref Range Status   MRSA by PCR NEGATIVE  NEGATIVE Final   Comment:            The GeneXpert MRSA Assay (FDA     approved for NASAL specimens     only), is one component of a     comprehensive MRSA colonization     surveillance program. It is  not     intended to diagnose MRSA     infection nor to guide or     monitor treatment for     MRSA infections.  Studies: Ct Abdomen Pelvis W Contrast  10/21/2013   CLINICAL DATA:  New onset diabetes possibly secondary to acute pancreatitis, with fever and diffuse abdominal pain, evaluate for pancreatitis  EXAM: CT ABDOMEN AND PELVIS WITH CONTRAST  TECHNIQUE: Multidetector CT imaging of the abdomen and pelvis was performed using the standard protocol following bolus administration of intravenous contrast.  CONTRAST:  OMNIPAQUE IOHEXOL 300 MG/ML  SOLN  COMPARISON:  DG ABD 1 VIEW dated 10/18/2013; US ABDOMEN COMPLETE dated 11/08/2011  FINDINGS: Small bilateral pleural effusions. Hazy midlung densities, likely exaggerated by motion artifact.  Significant diffuse fatty infiltration of the liver. Gallbladder is surgically absent. Spleen is normal. There is mild inflammatory change adjacent to the tail of the pancreas. A very small volume of fluid extends posterior to the gastric fundus. Pancreas is otherwise normal. Adrenal glands are normal. Left kidney is normal. Right kidney shows a 2 mm upper pole and a 3 mm lower pole nonobstructing stone.  Abdominal aorta appears normal.  Bowel is normal.  Bladder is normal. There is an intrauterine device noted. Ovaries are normal. There is trace free fluid in the cul-de-sac of the pelvis.  There are no acute musculoskeletal findings. There is oral contrast into the cecum.  IMPRESSION: Findings suggest possibility of mild pulmonary edema  Significant fatty infiltration of the liver  Mild inflammatory change in the region of the tail of the pancreas which suggests the possibility of mild acute pancreatitis.   Electronically Signed   By: Esperanza Heir M.D.   On: 10/21/2013 15:44   Dg Chest Port 1 View  10/21/2013   CLINICAL DATA:  Fever.  EXAM: PORTABLE CHEST - 1 VIEW  COMPARISON:  10/17/2013  FINDINGS: Heart, mediastinum hila unremarkable.  There is minor mid  lung zone reticular type opacity that is most likely atelectasis accentuated by relatively low lung volumes and the semi-erect positioning. There is no convincing infiltrate. No pulmonary edema. No pleural effusion or pneumothorax.  IMPRESSION: No acute cardiopulmonary disease.   Electronically Signed   By: Amie Portland M.D.   On: 10/21/2013 09:26    Scheduled Meds: . atorvastatin  40 mg Oral q1800  . fenofibrate  160 mg Oral Daily  . heparin  5,000 Units Subcutaneous 3 times per day  . imipenem-cilastatin  500 mg Intravenous 4 times per day  . mometasone-formoterol  2 puff Inhalation BID  . nicotine  21 mg Transdermal Daily  . pantoprazole (PROTONIX) IV  40 mg Intravenous Q24H  . QUEtiapine  400 mg Oral QHS  . sodium bicarbonate  1,300 mg Oral TID  . sodium chloride  3 mL Intravenous Q12H  . tiotropium  18 mcg Inhalation Daily   Continuous Infusions: . dextrose 5 % and 0.45% NaCl 125 mL/hr at 10/22/13 0909  . insulin (NOVOLIN-R) infusion 1.1 Units/hr (10/22/13 3086)    Principal Problem:   DKA (diabetic ketoacidoses) Active Problems:   Pancreatitis, acute   TOBACCO ABUSE   DEPRESSIVE DISORDER NOT ELSEWHERE CLASSIFIED   Other and unspecified hyperlipidemia   Hypertriglyceridemia   Leukocytosis, unspecified   Abdominal pain, epigastric   Hypokalemia    Time spent: 40 MINS    Anne Fu [facility] Pager [phone]. If 7PM-7AM, please contact night-coverage at [email or web address], password [facility] 10/22/2013, 9:19 AM  LOS: 5 days

## 2013-10-23 DIAGNOSIS — B952 Enterococcus as the cause of diseases classified elsewhere: Secondary | ICD-10-CM

## 2013-10-23 DIAGNOSIS — M79609 Pain in unspecified limb: Secondary | ICD-10-CM

## 2013-10-23 DIAGNOSIS — N390 Urinary tract infection, site not specified: Secondary | ICD-10-CM | POA: Clinically undetermined

## 2013-10-23 DIAGNOSIS — R82998 Other abnormal findings in urine: Secondary | ICD-10-CM

## 2013-10-23 DIAGNOSIS — R6510 Systemic inflammatory response syndrome (SIRS) of non-infectious origin without acute organ dysfunction: Secondary | ICD-10-CM | POA: Diagnosis not present

## 2013-10-23 LAB — CBC WITH DIFFERENTIAL/PLATELET
BASOS PCT: 0 % (ref 0–1)
Basophils Absolute: 0 10*3/uL (ref 0.0–0.1)
EOS ABS: 0 10*3/uL (ref 0.0–0.7)
EOS PCT: 0 % (ref 0–5)
HCT: 28.4 % — ABNORMAL LOW (ref 36.0–46.0)
Hemoglobin: 9.6 g/dL — ABNORMAL LOW (ref 12.0–15.0)
Lymphocytes Relative: 15 % (ref 12–46)
Lymphs Abs: 2.4 10*3/uL (ref 0.7–4.0)
MCH: 29.8 pg (ref 26.0–34.0)
MCHC: 33.8 g/dL (ref 30.0–36.0)
MCV: 88.2 fL (ref 78.0–100.0)
MONO ABS: 1.3 10*3/uL — AB (ref 0.1–1.0)
Monocytes Relative: 8 % (ref 3–12)
NEUTROS ABS: 12.4 10*3/uL — AB (ref 1.7–7.7)
NEUTROS PCT: 77 % (ref 43–77)
Platelets: 209 10*3/uL (ref 150–400)
RBC: 3.22 MIL/uL — ABNORMAL LOW (ref 3.87–5.11)
RDW: 14.9 % (ref 11.5–15.5)
WBC: 16.1 10*3/uL — AB (ref 4.0–10.5)

## 2013-10-23 LAB — GLUCOSE, CAPILLARY
GLUCOSE-CAPILLARY: 238 mg/dL — AB (ref 70–99)
GLUCOSE-CAPILLARY: 131 mg/dL — AB (ref 70–99)
Glucose-Capillary: 176 mg/dL — ABNORMAL HIGH (ref 70–99)
Glucose-Capillary: 132 mg/dL — ABNORMAL HIGH (ref 70–99)

## 2013-10-23 LAB — BASIC METABOLIC PANEL
BUN: 4 mg/dL — ABNORMAL LOW (ref 6–23)
BUN: 3 mg/dL — ABNORMAL LOW (ref 6–23)
BUN: 3 mg/dL — ABNORMAL LOW (ref 6–23)
BUN: 4 mg/dL — ABNORMAL LOW (ref 6–23)
CALCIUM: 8.4 mg/dL (ref 8.4–10.5)
CHLORIDE: 100 meq/L (ref 96–112)
CHLORIDE: 100 meq/L (ref 96–112)
CO2: 20 meq/L (ref 19–32)
CO2: 20 mEq/L (ref 19–32)
CO2: 21 meq/L (ref 19–32)
CO2: 21 mEq/L (ref 19–32)
CREATININE: 0.62 mg/dL (ref 0.50–1.10)
Calcium: 8.6 mg/dL (ref 8.4–10.5)
Calcium: 8.5 mg/dL (ref 8.4–10.5)
Calcium: 8.7 mg/dL (ref 8.4–10.5)
Chloride: 99 mEq/L (ref 96–112)
Chloride: 105 mEq/L (ref 96–112)
Creatinine, Ser: 0.6 mg/dL (ref 0.50–1.10)
Creatinine, Ser: 0.63 mg/dL (ref 0.50–1.10)
Creatinine, Ser: 0.71 mg/dL (ref 0.50–1.10)
GFR calc Af Amer: >90 mL/min (ref >90)
GFR calc Af Amer: >90 mL/min (ref >90)
GFR calc non Af Amer: >90 mL/min (ref >90)
GFR calc non Af Amer: >90 mL/min (ref >90)
GFR calc non Af Amer: >90 mL/min (ref >90)
Glucose, Bld: 201 mg/dL — ABNORMAL HIGH (ref 70–99)
Glucose, Bld: 227 mg/dL — ABNORMAL HIGH (ref 70–99)
Glucose, Bld: 242 mg/dL — ABNORMAL HIGH (ref 70–99)
Glucose, Bld: 169 mg/dL — ABNORMAL HIGH (ref 70–99)
POTASSIUM: 4.2 meq/L (ref 3.7–5.3)
POTASSIUM: 4.1 meq/L (ref 3.7–5.3)
Potassium: 3.8 mEq/L (ref 3.7–5.3)
Potassium: 3.7 mEq/L (ref 3.7–5.3)
SODIUM: 135 meq/L — AB (ref 137–147)
SODIUM: 134 meq/L — AB (ref 137–147)
Sodium: 138 mEq/L (ref 137–147)
Sodium: 140 mEq/L (ref 137–147)

## 2013-10-23 LAB — LACTIC ACID, PLASMA: Lactic Acid, Venous: 0.8 mmol/L (ref 0.5–2.2)

## 2013-10-23 LAB — URINE CULTURE: Colony Count: >=100000

## 2013-10-23 LAB — PROCALCITONIN: Procalcitonin: 0.21 ng/mL

## 2013-10-23 MED ORDER — SODIUM CHLORIDE 0.9 % IV SOLN
INTRAVENOUS | Status: DC
  Administered 2013-10-23 – 2013-10-24 (×4): via INTRAVENOUS
  Filled 2013-10-23: qty 1000

## 2013-10-23 MED ORDER — ACETAMINOPHEN 325 MG PO TABS
650.0000 mg | ORAL_TABLET | ORAL | Status: DC | PRN
  Administered 2013-10-23 – 2013-10-25 (×3): 650 mg via ORAL
  Filled 2013-10-23 (×3): qty 2

## 2013-10-23 NOTE — Progress Notes (Signed)
[facility] PROGRESS NOTE  Stacy Pierce DGU:[id number] DOB: April 07, 1971 DOA: 10/17/2013 PCP: [name], NP  Assessment/Plan: #1 systemic inflammatory response syndrome Questionable etiology. Patient noted to have a temp of 102.3 this morning although being on empiric IV antibiotics. Urine cultures greater than 100,000 enterococcus species which are sensitive to ampicillin. Chest x-ray which was done was negative. CT of the abdomen and pelvis which was done on 10/21/2013 with mild changes of acute pancreatitis. Blood cultures are pending. Patient denies any diarrhea. Continue empiric IV Primaxin. Will consult with infectious diseases for further evaluation and management.  #2 DKA/New onset DM Questionable etiology. Patient new-onset DM. Maybe secondary to acute pancreatitis. Patient with prior hx of ETOH abuse and states last drink was in March. Patient denies any chest pain. No shortness of breath. KUB negative. Cardiac enzymes negative x 3. EKG with sinus tachycardia. Patient still has an anion gap of 15. Patient with a bicarbonate of 20. Patient has been transitioned off the glucose stabilized and currently on subcutaneous Lantus. Change D5 normal saline to normal saline. Repeat be met this afternoon. Continue sliding scale insulin.  Hemoglobin A1c is 9.8. Continue supportive care.  #3 anion gap Questionable etiology. May be secondary to acute pancreatitis. Bicarbonate level is now at 20. Patient with no further nausea or emesis. Will check a lactic acid level. CT of the abdomen and pelvis done on 10/21/2013 showed mild acute pancreatitis. Continue IV fluids, supportive care and empiric IV antibiotics.  #4 hyperlipidemia/hypertriglyceridemia anemia Patient's triglyceride level is 939. Continue statin and TriCor. Follow.  #5 Acute pancreatitis/ epig abd pain Lipase level elevated at 1904 and trending down to 570  Then to 242 then 444, then 227 on 10/22/2013.Marland Kitchen Clinical improvement. KUB  neg. Continue IVF. Supportive care. Tolerating solid diet. CT abd and pelvis with mild acute pancreatitis.  #6. Fever ?? Etiology. Patient with a temperature of 102.3 this morning. CXR neg. UA neg however urine culture with greater than 100,000 enterococcus species which is sensitive to ampicillin. Blood cultures pending. CT abd pelvis c/w mild acute pancreatitis, neg for abscess. Increased WBC. Continue IV Primaxin. Will consult with infectious disease for further evaluation and management. Follow.  #7 Hypokalemia Secondary to #1. Repleted.  #8 depression Stable. Continue seroquel.  #9 COPD Stable. Continue Spiriva and dulera.  #10 tobacco abuse Tobacco cessation. Continue nicotine patch.  #11 leukocytosis ?? Etiology. Chest x-ray is negative. Patient with a temp of 102.3 and tachycardic this morning. CT abd pelvis with changes c/w mild acute pancreatitis in tail of pancreas. No abscess. Blood cultures pending. Urine cultures with greater than 100,000 enterococcus species sensitive to ampicillin. Continue IV Primaxin. Consult with ID for further evaluation and management.  #12 and prophylaxis PPI for GI prophylaxis. Heparin for DVT prophylaxis.  Code Status: Full Family Communication: Updated patient no family at bedside. Disposition Plan: Remaining step down today.   Consultants:  None  Procedures:  Chest x-ray 10/17/2013, 10/21/13  Abd xray 10/18/13  CT abd/pelvis 10/21/13  Antibiotics:  IV Primaxin 10/21/13  HPI/Subjective: Patient denies nausea and emesis. No abdominal pain. Patient states he doesn't feel well this morning. No diarrhea. No coughing. No dysuria.  Objective: Filed Vitals:   10/23/13 0756  BP: 122/65  Pulse: 124  Temp: 102.3 F (39.1 C)  Resp: 18    Intake/Output Summary (Last 24 hours) at 10/23/13 0856 Last data filed at 10/23/13 9563  Gross per 24 hour  Intake 1536.75 ml  Output   2400 ml  Net -  863.25 ml   Filed Weights   10/18/13 0015  10/19/13 0411 10/20/13 0250  Weight: 67.8 kg (149 lb 7.6 oz) 67.8 kg (149 lb 7.6 oz) 70.2 kg (154 lb 12.2 oz)    Exam:   General:  NAD  Cardiovascular: RRR  Respiratory: CTAB  Abdomen: sOFT/mild distension/ nttp, +BS  Musculoskeletal: No c/c/e  Data Reviewed: Basic Metabolic Panel:  Recent Labs Lab 10/19/13 1610 10/19/13 1915  10/20/13 0921  10/21/13 1222  10/22/13 1510 10/22/13 1938 10/22/13 2221 10/23/13 0335 10/23/13 0720  NA 142 145  < >  --   < > 137  < > 139 135* 135* 134* 138  K 3.8 3.5*  < >  --   < > 3.6*  < > 3.6* 3.8 3.8 4.2 4.1  CL 110 111  < >  --   < > 103  < > 103 98 100 99 100  CO2 16* 18*  < >  --   < > 19  < > '20 21 20 20 21  ' GLUCOSE 124* 171*  < >  --   < > 153*  < > 122* 203* 201* 227* 242*  BUN 3* <3*  < >  --   < > 4*  < > 5* 4* 4* 3* 3*  CREATININE 0.58 0.58  < >  --   < > 0.63  < > 0.63 0.66 0.60 0.63 0.71  CALCIUM 7.7* 8.2*  < >  --   < > 8.5  < > 8.2* 8.4 8.4 8.6 8.5  MG  --  1.6  --  1.5  --  1.9  --   --   --   --   --   --   < > = values in this interval not displayed. Liver Function Tests:  Recent Labs Lab 10/18/13 0052 10/18/13 1421  AST 20 25  ALT 31 27  ALKPHOS 112 101  BILITOT 0.2* 0.4  PROT 6.9 6.3  ALBUMIN 3.3* 2.8*    Recent Labs Lab 10/18/13 0930 10/19/13 0240 10/20/13 0330 10/21/13 1222 10/22/13 0830  LIPASE 1904* 570* 242* 444* 227*   No results found for this basename: AMMONIA,  in the last 168 hours CBC:  Recent Labs Lab 10/19/13 0240 10/20/13 0330 10/21/13 0459 10/22/13 0830 10/23/13 0720  WBC 13.4* 14.9* 14.5* 18.5* 16.1*  NEUTROABS  --   --   --  14.6* PENDING  HGB 11.9* 10.9* 10.4* 9.6* 9.6*  HCT 34.6* 31.8* 29.3* 28.2* 28.4*  MCV 87.2 86.4 86.7 87.9 88.2  PLT 154 147* 187 173 PENDING   Cardiac Enzymes:  Recent Labs Lab 10/18/13 0930 10/18/13 1433 10/18/13 2222  TROPONINI <0.30 <0.30 <0.30   BNP (last 3 results) No results found for this basename: PROBNP,  in the last 8760  hours CBG:  Recent Labs Lab 10/22/13 1619 10/22/13 1704 10/22/13 1855 10/22/13 1959 10/23/13 0754  GLUCAP 147* 167* 199* 190* 238*    Recent Results (from the past 240 hour(s))  RAPID STREP SCREEN     Status: None   Collection Time    10/16/13  6:48 AM      Result Value Ref Range Status   Streptococcus, Group A Screen (Direct) NEGATIVE  NEGATIVE Final   Comment: (NOTE)     A Rapid Antigen test may result negative if the antigen level in the     sample is below the detection level of this test. The FDA has not  cleared this test as a stand-alone test therefore the rapid antigen     negative result has reflexed to a Group A Strep culture.  CULTURE, GROUP A STREP     Status: None   Collection Time    10/16/13  6:48 AM      Result Value Ref Range Status   Specimen Description THROAT   Final   Special Requests ADDED 0715   Final   Culture     Final   Value: No Beta Hemolytic Streptococci Isolated     Performed at [facility]   Report Status 10/18/2013 FINAL   Final  MRSA PCR SCREENING     Status: None   Collection Time    10/17/13 11:50 PM      Result Value Ref Range Status   MRSA by PCR NEGATIVE  NEGATIVE Final   Comment:            The GeneXpert MRSA Assay (FDA     approved for NASAL specimens     only), is one component of a     comprehensive MRSA colonization     surveillance program. It is not     intended to diagnose MRSA     infection nor to guide or     monitor treatment for     MRSA infections.  URINE CULTURE     Status: None   Collection Time    10/21/13  5:13 AM      Result Value Ref Range Status   Specimen Description URINE, CLEAN CATCH   Final   Special Requests NONE   Final   Culture  Setup Time     Final   Value: 10/21/2013 14:43     Performed at [place]     Final   Value: >=100,000 COLONIES/ML     Performed at [facility]   Culture     Final   Value: ENTEROCOCCUS SPECIES     Performed at [facility]   Report Status 10/23/2013 FINAL   Final   Organism ID, Bacteria ENTEROCOCCUS SPECIES   Final  CULTURE, BLOOD (SINGLE)     Status: None   Collection Time    10/21/13  6:00 AM      Result Value Ref Range Status   Specimen Description BLOOD RIGHT FOREARM   Final   Special Requests BOTTLES DRAWN AEROBIC AND ANAEROBIC 10CC EACH   Final   Culture  Setup Time     Final   Value: 10/21/2013 13:30     Performed at [facility]   Culture     Final   Value:        BLOOD CULTURE RECEIVED NO GROWTH TO DATE CULTURE WILL BE HELD FOR 5 DAYS BEFORE ISSUING A FINAL NEGATIVE REPORT     Performed at [facility]   Report Status PENDING   Incomplete  CULTURE, BLOOD (ROUTINE X 2)     Status: None   Collection Time    10/21/13  9:36 AM      Result Value Ref Range Status   Specimen Description BLOOD RIGHT ARM   Final   Special Requests BOTTLES DRAWN AEROBIC ONLY 8CC   Final   Culture  Setup Time     Final   Value: 10/21/2013 17:23     Performed at [facility]   Culture     Final   Value:  BLOOD CULTURE RECEIVED NO GROWTH TO DATE CULTURE WILL BE HELD FOR 5 DAYS BEFORE ISSUING A FINAL NEGATIVE REPORT     Performed at [facility]   Report Status PENDING   Incomplete  CULTURE, BLOOD (ROUTINE X 2)     Status: None   Collection Time    10/21/13  9:40 AM      Result Value Ref Range Status   Specimen Description BLOOD RIGHT HAND   Final   Special Requests BOTTLES DRAWN AEROBIC ONLY 10CC   Final   Culture  Setup Time     Final   Value: 10/21/2013 17:23     Performed at [facility]   Culture     Final   Value:        BLOOD CULTURE RECEIVED NO GROWTH TO DATE CULTURE WILL BE HELD FOR 5 DAYS BEFORE ISSUING A FINAL NEGATIVE REPORT     Performed at [facility]   Report Status PENDING   Incomplete     Studies: Ct Abdomen Pelvis W Contrast  10/21/2013   CLINICAL DATA:  New onset diabetes possibly secondary to acute pancreatitis, with fever  and diffuse abdominal pain, evaluate for pancreatitis  EXAM: CT ABDOMEN AND PELVIS WITH CONTRAST  TECHNIQUE: Multidetector CT imaging of the abdomen and pelvis was performed using the standard protocol following bolus administration of intravenous contrast.  CONTRAST:  151m OMNIPAQUE IOHEXOL 300 MG/ML  SOLN  COMPARISON:  DG ABD 1 VIEW dated 10/18/2013; [place] 11/08/2011  FINDINGS: Small bilateral pleural effusions. Hazy midlung densities, likely exaggerated by motion artifact.  Significant diffuse fatty infiltration of the liver. Gallbladder is surgically absent. Spleen is normal. There is mild inflammatory change adjacent to the tail of the pancreas. A very small volume of fluid extends posterior to the gastric fundus. Pancreas is otherwise normal. Adrenal glands are normal. Left kidney is normal. Right kidney shows a 2 mm upper pole and a 3 mm lower pole nonobstructing stone.  Abdominal aorta appears normal.  Bowel is normal.  Bladder is normal. There is an intrauterine device noted. Ovaries are normal. There is trace free fluid in the cul-de-sac of the pelvis.  There are no acute musculoskeletal findings. There is oral contrast into the cecum.  IMPRESSION: Findings suggest possibility of mild pulmonary edema  Significant fatty infiltration of the liver  Mild inflammatory change in the region of the tail of the pancreas which suggests the possibility of mild acute pancreatitis.   Electronically Signed   By: RSkipper ClicheM.D.   On: 10/21/2013 15:44    Scheduled Meds: . atorvastatin  40 mg Oral q1800  . fenofibrate  160 mg Oral Daily  . heparin  5,000 Units Subcutaneous 3 times per day  . imipenem-cilastatin  500 mg Intravenous 4 times per day  . insulin aspart  0-15 Units Subcutaneous TID WC  . insulin glargine  15 Units Subcutaneous QHS  . mometasone-formoterol  2 puff Inhalation BID  . nicotine  21 mg Transdermal Daily  . pantoprazole  40 mg Oral Daily  . QUEtiapine  400 mg Oral  QHS  . sodium chloride  3 mL Intravenous Q12H  . tiotropium  18 mcg Inhalation Daily   Continuous Infusions: . sodium chloride 0.9 % 1,000 mL infusion      Principal Problem:   SIRS (systemic inflammatory response syndrome) Active Problems:   Pancreatitis, acute   TOBACCO ABUSE   DEPRESSIVE DISORDER NOT ELSEWHERE CLASSIFIED   DKA (diabetic  ketoacidoses)   Other and unspecified hyperlipidemia   Hypertriglyceridemia   Leukocytosis, unspecified   Abdominal pain, epigastric   Hypokalemia   Enterococcus UTI    Time spent: [place] [facility] Pager [phone]. If 7PM-7AM, please contact night-coverage at [email or web address], password [facility] 10/23/2013, 8:56 AM  LOS: 6 days

## 2013-10-23 NOTE — Progress Notes (Signed)
Bilateral lower extremity venous duplex:  No evidence of DVT, superficial thrombosis, or Baker's Cyst.   

## 2013-10-23 NOTE — Progress Notes (Signed)
Inpatient Diabetes Program Recommendations  AACE/ADA: New Consensus Statement on Inpatient Glycemic Control (2013)  Target Ranges:  Prepandial:   less than 140 mg/dL      Peak postprandial:   less than 180 mg/dL (1-2 hours)      Critically ill patients:  140 - 180 mg/dL    Note: Supportive visit at bedside.  Patient states she is feeling better now than she did early this morning.  Has watched 2 diabetes videos.  Encouraged her to watch remaining videos so she can feel more comfortable about caring for self after discharge.  Offered to review use of the insulin pen with her-- but she states she has already been shown and feels very comfortable about it.  Reinforced need for Outpatient Education [facility] after discharge so she can learn more about diabetes self-care and make wise decisions day to day.  Talonda Artist S. Elsie Lincolnouth, RN, MSN, CDE Inpatient Diabetes Program, team pager [phone]

## 2013-10-23 NOTE — Consult Note (Addendum)
[facility]    Date of Admission:  10/17/2013  Date of Consult:  10/23/2013  Reason for Consult: Fever unknown origin Referring Physician: Dr. Grandville Silos   HPI: Stacy Pierce is an 43 y.o. female with [place] otherwise resume her COPD bipolar disorder diabetes mellitus admitted for shortness of breath and found to be diabetic ketoacidosis. DG scan done on admission showed question of mild changes consistent with acute pancreatitis. Blood cultures obtained urine cultures were taken and grew  ampicillin sensitive enterococcus. In talking to the patient she did not clearly have any urinary symptoms. Despite appropriate lead effective antibiotics in the form of imipenem which would clearly take care of her enterococcus she continues to have fevers now up to 2.3 this morning.  He apparently initially told Dr. Grandville Silos she was not having that many bowel movements I taught her today she did endorse having at least 5-6 bowel movements yesterday there were all loose and she believes happened after she. I think therefore checking a C. differential PCR is reasonable as her Grandville Silos had originally planned she also is complaining of severe pain in her lower extremity I therefore ordered Doppler to evaluate for deep venous thrombosis.   Past Medical History  Diagnosis Date  . Gallstones   . Depression   . Anxiety   . COPD (chronic obstructive pulmonary disease)   . Bipolar 1 disorder   . Other and unspecified hyperlipidemia 10/18/2013  . Hypertriglyceridemia 10/18/2013  . Diabetes mellitus without complication     Past Surgical History  Procedure Laterality Date  . Cholecystectomy  11/07/2011  . Cholecystectomy  11/09/2011    Procedure: LAPAROSCOPIC CHOLECYSTECTOMY;  Surgeon: Madilyn Hook, DO;  Location: [facility];  Service: General;  Laterality: N/A;  Lap. Chole. with IOC  ergies:   Allergies  Allergen Reactions  . Abilify [Aripiprazole] Hives and Itching  . Amoxicillin Hives,  Itching and Rash  . Lithium Hives  . Wellbutrin [Bupropion] Rash     Medications: I have reviewed patients current medications as documented in Epic Anti-infectives   Start     Dose/Rate Route Frequency Ordered Stop   10/21/13 0600  imipenem-cilastatin (PRIMAXIN) 500 mg in sodium chloride 0.9 % 100 mL IVPB     500 mg 200 mL/hr over 30 Minutes Intravenous 4 times per day 10/21/13 8101        Social History:  reports that she has been smoking Cigarettes.  She has been smoking about 0.50 packs per day. She has never used smokeless tobacco. She reports that she drinks alcohol. She reports that she uses illicit drugs (Marijuana).  History reviewed. No pertinent family history.  As in HPI and primary teams notes otherwise 12 point review of systems is negative  Blood pressure 111/66, pulse 110, temperature 98 F (36.7 C), temperature source Oral, resp. rate 12, height _0  (1.575 m), weight 154 lb 12.2 oz (70.2 kg), SpO2 94.00%. General: Alert and awake, oriented x3, not in any acute distress. Dysphoric HEENT: anicteric sclera, pupils reactive to light and accommodation, EOMI, oropharynx clear and without exudate CVS regular rate, normal r,  no murmur rubs or gallops Chest: clear to auscultation bilaterally, no wheezing, rales or rhonchi Abdomen: soft diffusely tender, nondistended, normal bowel sounds, Extremities: Left knee slightly tender to palpation but no appreciable effusion  Skin: no rashes Neuro: nonfocal, strength and sensation intact   Results for orders placed during the hospital encounter of 10/17/13 (from the past 48 hour(s))  BASIC METABOLIC  PANEL     Status: Abnormal   Collection Time    10/21/13 12:22 PM      Result Value Ref Range   Sodium 137  137 - 147 mEq/L   Potassium 3.6 (*) 3.7 - 5.3 mEq/L   Chloride 103  96 - 112 mEq/L   CO2 19  19 - 32 mEq/L   Glucose, Bld 153 (*) 70 - 99 mg/dL   BUN 4 (*) 6 - 23 mg/dL   Creatinine, Ser 0.63  0.50 - 1.10 mg/dL    Calcium 8.5  8.4 - 10.5 mg/dL   GFR calc non Af Amer >90  >90 mL/min   GFR calc Af Amer >90  >90 mL/min   Comment: (NOTE)     The eGFR has been calculated using the CKD EPI equation.     This calculation has not been validated in all clinical situations.     eGFR's persistently <90 mL/min signify possible Chronic Kidney     Disease.  LIPASE, BLOOD     Status: Abnormal   Collection Time    10/21/13 12:22 PM      Result Value Ref Range   Lipase 444 (*) 11 - 59 U/L  MAGNESIUM     Status: None   Collection Time    10/21/13 12:22 PM      Result Value Ref Range   Magnesium 1.9  1.5 - 2.5 mg/dL  GLUCOSE, CAPILLARY     Status: Abnormal   Collection Time    10/21/13 12:27 PM      Result Value Ref Range   Glucose-Capillary 142 (*) 70 - 99 mg/dL  GLUCOSE, CAPILLARY     Status: None   Collection Time    10/21/13  2:43 PM      Result Value Ref Range   Glucose-Capillary 76  70 - 99 mg/dL  GLUCOSE, CAPILLARY     Status: Abnormal   Collection Time    10/21/13  3:53 PM      Result Value Ref Range   Glucose-Capillary 121 (*) 70 - 99 mg/dL  GLUCOSE, CAPILLARY     Status: Abnormal   Collection Time    10/21/13  5:01 PM      Result Value Ref Range   Glucose-Capillary 147 (*) 70 - 99 mg/dL  GLUCOSE, CAPILLARY     Status: Abnormal   Collection Time    10/21/13  7:12 PM      Result Value Ref Range   Glucose-Capillary 177 (*) 70 - 99 mg/dL  GLUCOSE, CAPILLARY     Status: Abnormal   Collection Time    10/21/13  8:17 PM      Result Value Ref Range   Glucose-Capillary 181 (*) 70 - 99 mg/dL  GLUCOSE, CAPILLARY     Status: Abnormal   Collection Time    10/21/13  9:32 PM      Result Value Ref Range   Glucose-Capillary 158 (*) 70 - 99 mg/dL  BASIC METABOLIC PANEL     Status: Abnormal   Collection Time    10/21/13  9:45 PM      Result Value Ref Range   Sodium 138  137 - 147 mEq/L   Potassium 4.0  3.7 - 5.3 mEq/L   Chloride 104  96 - 112 mEq/L   CO2 19  19 - 32 mEq/L   Glucose, Bld 152 (*)  70 - 99 mg/dL   BUN 5 (*) 6 - 23 mg/dL   Creatinine, Ser 0.58  0.50 - 1.10 mg/dL   Calcium 9.1  8.4 - 10.5 mg/dL   GFR calc non Af Amer >90  >90 mL/min   GFR calc Af Amer >90  >90 mL/min   Comment: (NOTE)     The eGFR has been calculated using the CKD EPI equation.     This calculation has not been validated in all clinical situations.     eGFR's persistently <90 mL/min signify possible Chronic Kidney     Disease.  GLUCOSE, CAPILLARY     Status: Abnormal   Collection Time    10/21/13 10:40 PM      Result Value Ref Range   Glucose-Capillary 146 (*) 70 - 99 mg/dL  GLUCOSE, CAPILLARY     Status: Abnormal   Collection Time    10/21/13 11:48 PM      Result Value Ref Range   Glucose-Capillary 146 (*) 70 - 99 mg/dL  GLUCOSE, CAPILLARY     Status: Abnormal   Collection Time    10/22/13  1:01 AM      Result Value Ref Range   Glucose-Capillary 139 (*) 70 - 99 mg/dL  GLUCOSE, CAPILLARY     Status: Abnormal   Collection Time    10/22/13  2:13 AM      Result Value Ref Range   Glucose-Capillary 139 (*) 70 - 99 mg/dL  GLUCOSE, CAPILLARY     Status: Abnormal   Collection Time    10/22/13  3:22 AM      Result Value Ref Range   Glucose-Capillary 150 (*) 70 - 99 mg/dL  BASIC METABOLIC PANEL     Status: Abnormal   Collection Time    10/22/13  4:10 AM      Result Value Ref Range   Sodium 139  137 - 147 mEq/L   Potassium 4.0  3.7 - 5.3 mEq/L   Chloride 105  96 - 112 mEq/L   CO2 18 (*) 19 - 32 mEq/L   Glucose, Bld 156 (*) 70 - 99 mg/dL   BUN 7  6 - 23 mg/dL   Creatinine, Ser 0.69  0.50 - 1.10 mg/dL   Calcium 8.8  8.4 - 10.5 mg/dL   GFR calc non Af Amer >90  >90 mL/min   GFR calc Af Amer >90  >90 mL/min   Comment: (NOTE)     The eGFR has been calculated using the CKD EPI equation.     This calculation has not been validated in all clinical situations.     eGFR's persistently <90 mL/min signify possible Chronic Kidney     Disease.  GLUCOSE, CAPILLARY     Status: Abnormal   Collection  Time    10/22/13  4:22 AM      Result Value Ref Range   Glucose-Capillary 165 (*) 70 - 99 mg/dL  GLUCOSE, CAPILLARY     Status: Abnormal   Collection Time    10/22/13  5:56 AM      Result Value Ref Range   Glucose-Capillary 178 (*) 70 - 99 mg/dL  GLUCOSE, CAPILLARY     Status: Abnormal   Collection Time    10/22/13  7:12 AM      Result Value Ref Range   Glucose-Capillary 172 (*) 70 - 99 mg/dL  CBC WITH DIFFERENTIAL     Status: Abnormal   Collection Time    10/22/13  8:30 AM      Result Value Ref Range   WBC 18.5 (*) 4.0 - 10.5 K/uL  RBC 3.21 (*) 3.87 - 5.11 MIL/uL   Hemoglobin 9.6 (*) 12.0 - 15.0 g/dL   HCT 28.2 (*) 36.0 - 46.0 %   MCV 87.9  78.0 - 100.0 fL   MCH 29.9  26.0 - 34.0 pg   MCHC 34.0  30.0 - 36.0 g/dL   RDW 15.0  11.5 - 15.5 %   Platelets 173  150 - 400 K/uL   Neutrophils Relative % 79 (*) 43 - 77 %   Lymphocytes Relative 12  12 - 46 %   Monocytes Relative 8  3 - 12 %   Eosinophils Relative 1  0 - 5 %   Basophils Relative 0  0 - 1 %   Neutro Abs 14.6 (*) 1.7 - 7.7 K/uL   Lymphs Abs 2.2  0.7 - 4.0 K/uL   Monocytes Absolute 1.5 (*) 0.1 - 1.0 K/uL   Eosinophils Absolute 0.2  0.0 - 0.7 K/uL   Basophils Absolute 0.0  0.0 - 0.1 K/uL   WBC Morphology ATYPICAL LYMPHOCYTES    LIPASE, BLOOD     Status: Abnormal   Collection Time    10/22/13  8:30 AM      Result Value Ref Range   Lipase 227 (*) 11 - 59 U/L  BASIC METABOLIC PANEL     Status: Abnormal   Collection Time    10/22/13  8:30 AM      Result Value Ref Range   Sodium 138  137 - 147 mEq/L   Potassium 4.1  3.7 - 5.3 mEq/L   Chloride 102  96 - 112 mEq/L   CO2 17 (*) 19 - 32 mEq/L   Glucose, Bld 195 (*) 70 - 99 mg/dL   BUN 7  6 - 23 mg/dL   Creatinine, Ser 0.68  0.50 - 1.10 mg/dL   Calcium 8.5  8.4 - 10.5 mg/dL   GFR calc non Af Amer >90  >90 mL/min   GFR calc Af Amer >90  >90 mL/min   Comment: (NOTE)     The eGFR has been calculated using the CKD EPI equation.     This calculation has not been  validated in all clinical situations.     eGFR's persistently <90 mL/min signify possible Chronic Kidney     Disease.  GLUCOSE, CAPILLARY     Status: Abnormal   Collection Time    10/22/13  8:54 AM      Result Value Ref Range   Glucose-Capillary 208 (*) 70 - 99 mg/dL  GLUCOSE, CAPILLARY     Status: Abnormal   Collection Time    10/22/13 10:37 AM      Result Value Ref Range   Glucose-Capillary 183 (*) 70 - 99 mg/dL  GLUCOSE, CAPILLARY     Status: Abnormal   Collection Time    10/22/13 11:49 AM      Result Value Ref Range   Glucose-Capillary 158 (*) 70 - 99 mg/dL  GLUCOSE, CAPILLARY     Status: Abnormal   Collection Time    10/22/13  1:35 PM      Result Value Ref Range   Glucose-Capillary 113 (*) 70 - 99 mg/dL  GLUCOSE, CAPILLARY     Status: Abnormal   Collection Time    10/22/13  2:58 PM      Result Value Ref Range   Glucose-Capillary 115 (*) 70 - 99 mg/dL  BASIC METABOLIC PANEL     Status: Abnormal   Collection Time    10/22/13  3:10  PM      Result Value Ref Range   Sodium 139  137 - 147 mEq/L   Potassium 3.6 (*) 3.7 - 5.3 mEq/L   Chloride 103  96 - 112 mEq/L   CO2 20  19 - 32 mEq/L   Glucose, Bld 122 (*) 70 - 99 mg/dL   BUN 5 (*) 6 - 23 mg/dL   Creatinine, Ser 0.63  0.50 - 1.10 mg/dL   Calcium 8.2 (*) 8.4 - 10.5 mg/dL   GFR calc non Af Amer >90  >90 mL/min   GFR calc Af Amer >90  >90 mL/min   Comment: (NOTE)     The eGFR has been calculated using the CKD EPI equation.     This calculation has not been validated in all clinical situations.     eGFR's persistently <90 mL/min signify possible Chronic Kidney     Disease.  GLUCOSE, CAPILLARY     Status: Abnormal   Collection Time    10/22/13  4:19 PM      Result Value Ref Range   Glucose-Capillary 147 (*) 70 - 99 mg/dL  GLUCOSE, CAPILLARY     Status: Abnormal   Collection Time    10/22/13  5:04 PM      Result Value Ref Range   Glucose-Capillary 167 (*) 70 - 99 mg/dL  GLUCOSE, CAPILLARY     Status: Abnormal    Collection Time    10/22/13  6:55 PM      Result Value Ref Range   Glucose-Capillary 199 (*) 70 - 99 mg/dL  BASIC METABOLIC PANEL     Status: Abnormal   Collection Time    10/22/13  7:38 PM      Result Value Ref Range   Sodium 135 (*) 137 - 147 mEq/L   Potassium 3.8  3.7 - 5.3 mEq/L   Chloride 98  96 - 112 mEq/L   CO2 21  19 - 32 mEq/L   Glucose, Bld 203 (*) 70 - 99 mg/dL   BUN 4 (*) 6 - 23 mg/dL   Creatinine, Ser 0.66  0.50 - 1.10 mg/dL   Calcium 8.4  8.4 - 10.5 mg/dL   GFR calc non Af Amer >90  >90 mL/min   GFR calc Af Amer >90  >90 mL/min   Comment: (NOTE)     The eGFR has been calculated using the CKD EPI equation.     This calculation has not been validated in all clinical situations.     eGFR's persistently <90 mL/min signify possible Chronic Kidney     Disease.  GLUCOSE, CAPILLARY     Status: Abnormal   Collection Time    10/22/13  7:59 PM      Result Value Ref Range   Glucose-Capillary 190 (*) 70 - 99 mg/dL  BASIC METABOLIC PANEL     Status: Abnormal   Collection Time    10/22/13 10:21 PM      Result Value Ref Range   Sodium 135 (*) 137 - 147 mEq/L   Potassium 3.8  3.7 - 5.3 mEq/L   Chloride 100  96 - 112 mEq/L   CO2 20  19 - 32 mEq/L   Glucose, Bld 201 (*) 70 - 99 mg/dL   BUN 4 (*) 6 - 23 mg/dL   Creatinine, Ser 0.60  0.50 - 1.10 mg/dL   Calcium 8.4  8.4 - 10.5 mg/dL   GFR calc non Af Amer >90  >90 mL/min   GFR calc Af  Amer >90  >90 mL/min   Comment: (NOTE)     The eGFR has been calculated using the CKD EPI equation.     This calculation has not been validated in all clinical situations.     eGFR's persistently <90 mL/min signify possible Chronic Kidney     Disease.  BASIC METABOLIC PANEL     Status: Abnormal   Collection Time    10/23/13  3:35 AM      Result Value Ref Range   Sodium 134 (*) 137 - 147 mEq/L   Potassium 4.2  3.7 - 5.3 mEq/L   Chloride 99  96 - 112 mEq/L   CO2 20  19 - 32 mEq/L   Glucose, Bld 227 (*) 70 - 99 mg/dL   BUN 3 (*) 6 - 23  mg/dL   Creatinine, Ser 0.63  0.50 - 1.10 mg/dL   Calcium 8.6  8.4 - 10.5 mg/dL   GFR calc non Af Amer >90  >90 mL/min   GFR calc Af Amer >90  >90 mL/min   Comment: (NOTE)     The eGFR has been calculated using the CKD EPI equation.     This calculation has not been validated in all clinical situations.     eGFR's persistently <90 mL/min signify possible Chronic Kidney     Disease.  CBC WITH DIFFERENTIAL     Status: Abnormal   Collection Time    10/23/13  7:20 AM      Result Value Ref Range   WBC 16.1 (*) 4.0 - 10.5 K/uL   Comment: REPEATED TO VERIFY   RBC 3.22 (*) 3.87 - 5.11 MIL/uL   Hemoglobin 9.6 (*) 12.0 - 15.0 g/dL   Comment: REPEATED TO VERIFY   HCT 28.4 (*) 36.0 - 46.0 %   MCV 88.2  78.0 - 100.0 fL   MCH 29.8  26.0 - 34.0 pg   MCHC 33.8  30.0 - 36.0 g/dL   RDW 14.9  11.5 - 15.5 %   Platelets 209  150 - 400 K/uL   Comment: REPEATED TO VERIFY     PLATELET COUNT CONFIRMED BY SMEAR   Neutrophils Relative % 77  43 - 77 %   Lymphocytes Relative 15  12 - 46 %   Monocytes Relative 8  3 - 12 %   Eosinophils Relative 0  0 - 5 %   Basophils Relative 0  0 - 1 %   Neutro Abs 12.4 (*) 1.7 - 7.7 K/uL   Lymphs Abs 2.4  0.7 - 4.0 K/uL   Monocytes Absolute 1.3 (*) 0.1 - 1.0 K/uL   Eosinophils Absolute 0.0  0.0 - 0.7 K/uL   Basophils Absolute 0.0  0.0 - 0.1 K/uL   RBC Morphology POLYCHROMASIA PRESENT     WBC Morphology MILD LEFT SHIFT (1-5% METAS, OCC MYELO, OCC BANDS)    BASIC METABOLIC PANEL     Status: Abnormal   Collection Time    10/23/13  7:20 AM      Result Value Ref Range   Sodium 138  137 - 147 mEq/L   Potassium 4.1  3.7 - 5.3 mEq/L   Chloride 100  96 - 112 mEq/L   CO2 21  19 - 32 mEq/L   Glucose, Bld 242 (*) 70 - 99 mg/dL   BUN 3 (*) 6 - 23 mg/dL   Creatinine, Ser 0.71  0.50 - 1.10 mg/dL   Calcium 8.5  8.4 - 10.5 mg/dL   GFR calc non  Af Amer >90  >90 mL/min   GFR calc Af Amer >90  >90 mL/min   Comment: (NOTE)     The eGFR has been calculated using the CKD EPI  equation.     This calculation has not been validated in all clinical situations.     eGFR's persistently <90 mL/min signify possible Chronic Kidney     Disease.  GLUCOSE, CAPILLARY     Status: Abnormal   Collection Time    10/23/13  7:54 AM      Result Value Ref Range   Glucose-Capillary 238 (*) 70 - 99 mg/dL   Comment 1 Notify RN    LACTIC ACID, PLASMA     Status: None   Collection Time    10/23/13 11:05 AM      Result Value Ref Range   Lactic Acid, Venous 0.8  0.5 - 2.2 mmol/L      Component Value Date/Time   SDES BLOOD RIGHT HAND 10/21/2013 0940   SPECREQUEST BOTTLES DRAWN AEROBIC ONLY 10CC 10/21/2013 0940   CULT  Value:        BLOOD CULTURE RECEIVED NO GROWTH TO DATE CULTURE WILL BE HELD FOR 5 DAYS BEFORE ISSUING A FINAL NEGATIVE REPORT Performed at [facility] 10/21/2013 0940   REPTSTATUS PENDING 10/21/2013 0940   Ct Abdomen Pelvis W Contrast  10/21/2013   CLINICAL DATA:  New onset diabetes possibly secondary to acute pancreatitis, with fever and diffuse abdominal pain, evaluate for pancreatitis  EXAM: CT ABDOMEN AND PELVIS WITH CONTRAST  TECHNIQUE: Multidetector CT imaging of the abdomen and pelvis was performed using the standard protocol following bolus administration of intravenous contrast.  CONTRAST:  123m OMNIPAQUE IOHEXOL 300 MG/ML  SOLN  COMPARISON:  DG ABD 1 VIEW dated 10/18/2013; [place] dated 11/08/2011  FINDINGS: Small bilateral pleural effusions. Hazy midlung densities, likely exaggerated by motion artifact.  Significant diffuse fatty infiltration of the liver. Gallbladder is surgically absent. Spleen is normal. There is mild inflammatory change adjacent to the tail of the pancreas. A very small volume of fluid extends posterior to the gastric fundus. Pancreas is otherwise normal. Adrenal glands are normal. Left kidney is normal. Right kidney shows a 2 mm upper pole and a 3 mm lower pole nonobstructing stone.  Abdominal aorta appears normal.  Bowel is  normal.  Bladder is normal. There is an intrauterine device noted. Ovaries are normal. There is trace free fluid in the cul-de-sac of the pelvis.  There are no acute musculoskeletal findings. There is oral contrast into the cecum.  IMPRESSION: Findings suggest possibility of mild pulmonary edema  Significant fatty infiltration of the liver  Mild inflammatory change in the region of the tail of the pancreas which suggests the possibility of mild acute pancreatitis.   Electronically Signed   By: RSkipper ClicheM.D.   On: 10/21/2013 15:44     Recent Results (from the past 720 hour(s))  RAPID STREP SCREEN     Status: None   Collection Time    10/16/13  6:48 AM      Result Value Ref Range Status   Streptococcus, Group A Screen (Direct) NEGATIVE  NEGATIVE Final   Comment: (NOTE)     A Rapid Antigen test may result negative if the antigen level in the     sample is below the detection level of this test. The FDA has not     cleared this test as a stand-alone test therefore the rapid antigen  negative result has reflexed to a Group A Strep culture.  CULTURE, GROUP A STREP     Status: None   Collection Time    10/16/13  6:48 AM      Result Value Ref Range Status   Specimen Description THROAT   Final   Special Requests ADDED 0715   Final   Culture     Final   Value: No Beta Hemolytic Streptococci Isolated     Performed at [facility]   Report Status 10/18/2013 FINAL   Final  MRSA PCR SCREENING     Status: None   Collection Time    10/17/13 11:50 PM      Result Value Ref Range Status   MRSA by PCR NEGATIVE  NEGATIVE Final   Comment:            The GeneXpert MRSA Assay (FDA     approved for NASAL specimens     only), is one component of a     comprehensive MRSA colonization     surveillance program. It is not     intended to diagnose MRSA     infection nor to guide or     monitor treatment for     MRSA infections.  URINE CULTURE     Status: None   Collection Time     10/21/13  5:13 AM      Result Value Ref Range Status   Specimen Description URINE, CLEAN CATCH   Final   Special Requests NONE   Final   Culture  Setup Time     Final   Value: 10/21/2013 14:43     Performed at [facility]     Final   Value: >=100,000 COLONIES/ML     Performed at [facility]   Culture     Final   Value: ENTEROCOCCUS SPECIES     Performed at [facility]   Report Status 10/23/2013 FINAL   Final   Organism ID, Bacteria ENTEROCOCCUS SPECIES   Final  CULTURE, BLOOD (SINGLE)     Status: None   Collection Time    10/21/13  6:00 AM      Result Value Ref Range Status   Specimen Description BLOOD RIGHT FOREARM   Final   Special Requests BOTTLES DRAWN AEROBIC AND ANAEROBIC 10CC EACH   Final   Culture  Setup Time     Final   Value: 10/21/2013 13:30     Performed at [facility]   Culture     Final   Value:        BLOOD CULTURE RECEIVED NO GROWTH TO DATE CULTURE WILL BE HELD FOR 5 DAYS BEFORE ISSUING A FINAL NEGATIVE REPORT     Performed at [facility]   Report Status PENDING   Incomplete  CULTURE, BLOOD (ROUTINE X 2)     Status: None   Collection Time    10/21/13  9:36 AM      Result Value Ref Range Status   Specimen Description BLOOD RIGHT ARM   Final   Special Requests BOTTLES DRAWN AEROBIC ONLY 8CC   Final   Culture  Setup Time     Final   Value: 10/21/2013 17:23     Performed at [facility]   Culture     Final   Value:        BLOOD CULTURE RECEIVED NO GROWTH TO DATE CULTURE WILL BE HELD FOR 5 DAYS  BEFORE ISSUING A FINAL NEGATIVE REPORT     Performed at [facility]   Report Status PENDING   Incomplete  CULTURE, BLOOD (ROUTINE X 2)     Status: None   Collection Time    10/21/13  9:40 AM      Result Value Ref Range Status   Specimen Description BLOOD RIGHT HAND   Final   Special Requests BOTTLES DRAWN AEROBIC ONLY 10CC   Final   Culture  Setup Time     Final   Value: 10/21/2013 17:23      Performed at [facility]   Culture     Final   Value:        BLOOD CULTURE RECEIVED NO GROWTH TO DATE CULTURE WILL BE HELD FOR 5 DAYS BEFORE ISSUING A FINAL NEGATIVE REPORT     Performed at [facility]   Report Status PENDING   Incomplete     Impression/Recommendation  Principal Problem:   SIRS (systemic inflammatory response syndrome) Active Problems:   TOBACCO ABUSE   DEPRESSIVE DISORDER NOT ELSEWHERE CLASSIFIED   DKA (diabetic ketoacidoses)   Other and unspecified hyperlipidemia   Hypertriglyceridemia   Leukocytosis, unspecified   Abdominal pain, epigastric   Pancreatitis, acute   Hypokalemia   Enterococcus UTI   Stacy Pierce is a 44 y.o. female with  admission for orders of breath chest pain leg pain found on CT to have possible pancreatitis with fevers that persisted despite broad-spectrum antibiotics in the form of imipenem she grown ampicillin sensitive enterococcus in urine although she does not clearly have urinary symptoms.  #1 fever of unknown origin: Not clear what this is a we'll check his C. difficile PCR.  The Dopplers were negative for deep venous stenoses  F. for C. difficile PCR is negative for all proceeding a CT of her chest with contrast and embark on a fever unknown origin workup with serologies that was ordered for tomorrow am  Would consider more aggressive imaging of legs though skeptical for myositis or septic arthritis.  #2 questionable urinary tract infection believe you need them on board for now.  3 screening will screen for HIV and hepatitis viruses  10/23/2013, 12:04 PM   Thank you so much for this interesting consult  [place] [facility] [phone] (pager) [phone] (office) 10/23/2013, 12:04 PM  [place] 10/23/2013, 12:04 PM

## 2013-10-24 ENCOUNTER — Inpatient Hospital Stay (HOSPITAL_COMMUNITY): Payer: Medicaid Other

## 2013-10-24 LAB — CBC WITH DIFFERENTIAL/PLATELET
BAND NEUTROPHILS: 0 % (ref 0–10)
BLASTS: 0 %
Basophils Absolute: 0.1 10*3/uL (ref 0.0–0.1)
Basophils Relative: 1 % (ref 0–1)
EOS ABS: 0.1 10*3/uL (ref 0.0–0.7)
Eosinophils Relative: 0 % (ref 0–5)
HCT: 26.3 % — ABNORMAL LOW (ref 36.0–46.0)
Hemoglobin: 9 g/dL — ABNORMAL LOW (ref 12.0–15.0)
LYMPHS PCT: 18 % (ref 12–46)
Lymphs Abs: 2.3 10*3/uL (ref 0.7–4.0)
MCH: 30.3 pg (ref 26.0–34.0)
MCHC: 34.2 g/dL (ref 30.0–36.0)
MCV: 88.6 fL (ref 78.0–100.0)
METAMYELOCYTES PCT: 1 %
MONO ABS: 0.7 10*3/uL (ref 0.1–1.0)
Monocytes Relative: 4 % (ref 3–12)
Myelocytes: 0 %
NEUTROS ABS: 9.6 10*3/uL — AB (ref 1.7–7.7)
NRBC: 0 /100{WBCs}
Neutrophils Relative %: 76 % (ref 43–77)
Platelets: 231 10*3/uL (ref 150–400)
Promyelocytes Absolute: 0 %
RBC: 2.97 MIL/uL — AB (ref 3.87–5.11)
RDW: 15.4 % (ref 11.5–15.5)
WBC: 12.8 10*3/uL — ABNORMAL HIGH (ref 4.0–10.5)

## 2013-10-24 LAB — C-REACTIVE PROTEIN: CRP: 23.1 mg/dL — AB (ref <0.60)

## 2013-10-24 LAB — COMPREHENSIVE METABOLIC PANEL
ALK PHOS: 110 U/L (ref 39–117)
ALT: 60 U/L — ABNORMAL HIGH (ref 0–35)
AST: 73 U/L — ABNORMAL HIGH (ref 0–37)
Albumin: 2.2 g/dL — ABNORMAL LOW (ref 3.5–5.2)
BUN: 4 mg/dL — AB (ref 6–23)
CHLORIDE: 102 meq/L (ref 96–112)
CO2: 20 meq/L (ref 19–32)
Calcium: 8.6 mg/dL (ref 8.4–10.5)
Creatinine, Ser: 0.67 mg/dL (ref 0.50–1.10)
GLUCOSE: 152 mg/dL — AB (ref 70–99)
Potassium: 4.3 mEq/L (ref 3.7–5.3)
Sodium: 139 mEq/L (ref 137–147)
Total Bilirubin: 0.6 mg/dL (ref 0.3–1.2)
Total Protein: 6 g/dL (ref 6.0–8.3)

## 2013-10-24 LAB — HIV ANTIBODY (ROUTINE TESTING W REFLEX): HIV 1&2 Ab, 4th Generation: NONREACTIVE

## 2013-10-24 LAB — RPR

## 2013-10-24 LAB — CK: Total CK: 66 U/L (ref 7–177)

## 2013-10-24 LAB — HEPATITIS PANEL, ACUTE
HCV Ab: NEGATIVE
HEP A IGM: NONREACTIVE
Hep B C IgM: NONREACTIVE
Hepatitis B Surface Ag: NEGATIVE

## 2013-10-24 LAB — SEDIMENTATION RATE: Sed Rate: 102 mm/hr — ABNORMAL HIGH (ref 0–22)

## 2013-10-24 LAB — GLUCOSE, CAPILLARY
GLUCOSE-CAPILLARY: 98 mg/dL (ref 70–99)
GLUCOSE-CAPILLARY: 136 mg/dL — AB (ref 70–99)
Glucose-Capillary: 134 mg/dL — ABNORMAL HIGH (ref 70–99)
Glucose-Capillary: 159 mg/dL — ABNORMAL HIGH (ref 70–99)
Glucose-Capillary: 168 mg/dL — ABNORMAL HIGH (ref 70–99)

## 2013-10-24 LAB — CLOSTRIDIUM DIFFICILE BY PCR: Toxigenic C. Difficile by PCR: NEGATIVE

## 2013-10-24 LAB — LIPASE, BLOOD: Lipase: 181 U/L — ABNORMAL HIGH (ref 11–59)

## 2013-10-24 LAB — LACTATE DEHYDROGENASE: LDH: 546 U/L — ABNORMAL HIGH (ref 94–250)

## 2013-10-24 LAB — FERRITIN: Ferritin: 2477 ng/mL — ABNORMAL HIGH (ref 10–291)

## 2013-10-24 LAB — RHEUMATOID FACTOR: Rhuematoid fact SerPl-aCnc: 17 IU/mL — ABNORMAL HIGH (ref <=14)

## 2013-10-24 MED ORDER — IOHEXOL 300 MG/ML  SOLN
80.0000 mL | Freq: Once | INTRAMUSCULAR | Status: AC | PRN
  Administered 2013-10-24: 80 mL via INTRAVENOUS

## 2013-10-24 NOTE — Progress Notes (Signed)
[facility] PROGRESS NOTE  Rock NephewGail R Knittel ZOX:[id number] DOB: 03-Nov-1970 DOA: 10/17/2013 PCP: [name], [name], NP  Assessment/Plan: #1 systemic inflammatory response syndrome Questionable etiology. Patient noted to have a temp of 102.3 yesterday, although being on empiric IV antibiotics. Currently afebrile. WBC trending down. Urine cultures greater than 100,000 enterococcus species which are sensitive to ampicillin. Chest x-ray which was done was negative. CT of the abdomen and pelvis which was done on 10/21/2013 with mild changes of acute pancreatitis. Blood cultures are pending. Patient denies any diarrhea, however admitted to ID she was having several loose stools. Cdiff PCR pending. CT chest pending. Continue empiric IV Primaxin. ID ff and appreciate input and rxcs.  #2 DKA/New onset DM Questionable etiology. Hemoglobin A1c is 9.8. Patient new-onset DM. Maybe secondary to acute pancreatitis. Patient with prior hx of ETOH abuse and states last drink was in March. Patient states had just been started on latuda prior to admission.Patient denies any chest pain. No shortness of breath. KUB negative. Cardiac enzymes negative x 3. EKG with sinus tachycardia. Patient still has an anion gap of 17. Patient with a bicarbonate of 20. Patient has been transitioned off the glucose stabilized and currently on subcutaneous Lantus. CBGs have ranged from 131-159.  Continue sliding scale insulin. Continue supportive care.  #3 anion gap Questionable etiology. May be secondary to acute pancreatitis. Bicarbonate level is now at 20. Patient with no further emesis. Lactic acid level WNL. CT of the abdomen and pelvis done on 10/21/2013 showed mild acute pancreatitis. Continue IV fluids, supportive care and empiric IV antibiotics.  #4 hyperlipidemia/hypertriglyceridemia anemia Patient's triglyceride level is 939. Continue statin and TriCor. Follow.  #5 Acute pancreatitis/ epig abd pain Lipase level elevated at 1904  and trending down to 570  Then to 242 then 444, then 227 on 10/22/2013. Lipase now at 181. Clinical improvement. KUB neg. Continue IVF. Supportive care. Patient states nausea with solids. Will change to full liquid diet. CT abd and pelvis with mild acute pancreatitis.  #6. Fever ?? Etiology. Patient with a temperature of 102.3 yesterday, currently afebrile. WBC trending down. CXR neg. UA neg however urine culture with greater than 100,000 enterococcus species which is sensitive to ampicillin. Blood cultures pending. C diff PCR pending. CT abd pelvis c/w mild acute pancreatitis, neg for abscess. Labs ordered by ID including HIV, hepatits pending. CT chest pending. Continue IV Primaxin. ID ff and appreciate input and rxcs. Follow.  #7 Hypokalemia Secondary to #1. Repleted.  #8 depression Stable. Continue seroquel.  #9 COPD Stable. Continue Spiriva and dulera.  #10 tobacco abuse Tobacco cessation. Continue nicotine patch.  #11 leukocytosis ?? Etiology. Chest x-ray is negative. Patient with a temp of 102.3 and tachycardic yesterday. WBC trending down. CT abd pelvis with changes c/w mild acute pancreatitis in tail of pancreas. No abscess. Blood cultures pending. Urine cultures with greater than 100,000 enterococcus species sensitive to ampicillin. Cdiff PCR pending. Continue IV Primaxin. ID ff and labs ordered and chest CT.   #12 and prophylaxis PPI for GI prophylaxis. Heparin for DVT prophylaxis.  Code Status: Full Family Communication: Updated patient no family at bedside. Disposition Plan: transfer to telemetry   Consultants:  ID: Dr Daiva EvesVan Dam 10/23/13  Procedures:  Chest x-ray 10/17/2013, 10/21/13  Abd xray 10/18/13  CT abd/pelvis 10/21/13  Antibiotics:  IV Primaxin 10/21/13  HPI/Subjective: Patient states has been feeling nauseas when trying to eat solids. Patient denies abdominal pain. Patient denies any loose stool today or yesterday. Patient states was taking laxatives  and  latuda prior to admission.  Objective: Filed Vitals:   10/24/13 0800  BP: 115/70  Pulse: 93  Temp:   Resp: 16    Intake/Output Summary (Last 24 hours) at 10/24/13 0837 Last data filed at 10/24/13 0700  Gross per 24 hour  Intake   2690 ml  Output   1650 ml  Net   1040 ml   Filed Weights   10/19/13 0411 10/20/13 0250 10/24/13 0413  Weight: 67.8 kg (149 lb 7.6 oz) 70.2 kg (154 lb 12.2 oz) 70 kg (154 lb 5.2 oz)    Exam:   General:  NAD  Cardiovascular: RRR  Respiratory: CTAB  Abdomen: sOFT/mild distension/ nttp, +BS  Musculoskeletal: No c/c/e  Data Reviewed: Basic Metabolic Panel:  Recent Labs Lab 10/19/13 1610 10/19/13 1915  10/20/13 0921  10/21/13 1222  10/22/13 2221 10/23/13 0335 10/23/13 0720 10/23/13 1415 10/24/13 0250  NA 142 145  < >  --   < > 137  < > 135* 134* 138 140 139  K 3.8 3.5*  < >  --   < > 3.6*  < > 3.8 4.2 4.1 3.7 4.3  CL 110 111  < >  --   < > 103  < > 100 99 100 105 102  CO2 16* 18*  < >  --   < > 19  < > 20 20 21 21 20   GLUCOSE 124* 171*  < >  --   < > 153*  < > 201* 227* 242* 169* 152*  BUN 3* <3*  < >  --   < > 4*  < > 4* 3* 3* 4* 4*  CREATININE 0.58 0.58  < >  --   < > 0.63  < > 0.60 0.63 0.71 0.62 0.67  CALCIUM 7.7* 8.2*  < >  --   < > 8.5  < > 8.4 8.6 8.5 8.7 8.6  MG  --  1.6  --  1.5  --  1.9  --   --   --   --   --   --   < > = values in this interval not displayed. Liver Function Tests:  Recent Labs Lab 10/18/13 0052 10/18/13 1421 10/24/13 0250  AST 20 25 73*  ALT 31 27 60*  ALKPHOS 112 101 110  BILITOT 0.2* 0.4 0.6  PROT 6.9 6.3 6.0  ALBUMIN 3.3* 2.8* 2.2*    Recent Labs Lab 10/19/13 0240 10/20/13 0330 10/21/13 1222 10/22/13 0830 10/24/13 0250  LIPASE 570* 242* 444* 227* 181*   No results found for this basename: AMMONIA,  in the last 168 hours CBC:  Recent Labs Lab 10/20/13 0330 10/21/13 0459 10/22/13 0830 10/23/13 0720 10/24/13 0250  WBC 14.9* 14.5* 18.5* 16.1* 12.8*  NEUTROABS  --   --  14.6*  12.4* 9.6*  HGB 10.9* 10.4* 9.6* 9.6* 9.0*  HCT 31.8* 29.3* 28.2* 28.4* 26.3*  MCV 86.4 86.7 87.9 88.2 88.6  PLT 147* 187 173 209 231   Cardiac Enzymes:  Recent Labs Lab 10/18/13 0930 10/18/13 1433 10/18/13 2222 10/24/13 0250  CKTOTAL  --   --   --  66  TROPONINI <0.30 <0.30 <0.30  --    BNP (last 3 results) No results found for this basename: PROBNP,  in the last 8760 hours CBG:  Recent Labs Lab 10/23/13 1226 10/23/13 1714 10/23/13 2129 10/24/13 0011 10/24/13 0757  GLUCAP 176* 131* 132* 159* 134*    Recent Results (from the past  240 hour(s))  RAPID STREP SCREEN     Status: None   Collection Time    10/16/13  6:48 AM      Result Value Ref Range Status   Streptococcus, Group A Screen (Direct) NEGATIVE  NEGATIVE Final   Comment: (NOTE)     A Rapid Antigen test may result negative if the antigen level in the     sample is below the detection level of this test. The FDA has not     cleared this test as a stand-alone test therefore the rapid antigen     negative result has reflexed to a Group A Strep culture.  CULTURE, GROUP A STREP     Status: None   Collection Time    10/16/13  6:48 AM      Result Value Ref Range Status   Specimen Description THROAT   Final   Special Requests ADDED 0715   Final   Culture     Final   Value: No Beta Hemolytic Streptococci Isolated     Performed at [facility]   Report Status 10/18/2013 FINAL   Final  MRSA PCR SCREENING     Status: None   Collection Time    10/17/13 11:50 PM      Result Value Ref Range Status   MRSA by PCR NEGATIVE  NEGATIVE Final   Comment:            The GeneXpert MRSA Assay (FDA     approved for NASAL specimens     only), is one component of a     comprehensive MRSA colonization     surveillance program. It is not     intended to diagnose MRSA     infection nor to guide or     monitor treatment for     MRSA infections.  URINE CULTURE     Status: None   Collection Time    10/21/13  5:13 AM       Result Value Ref Range Status   Specimen Description URINE, CLEAN CATCH   Final   Special Requests NONE   Final   Culture  Setup Time     Final   Value: 10/21/2013 14:43     Performed at [facility]   Colony Count     Final   Value: >=100,000 COLONIES/ML     Performed at [facility]   Culture     Final   Value: ENTEROCOCCUS SPECIES     Performed at [facility]   Report Status 10/23/2013 FINAL   Final   Organism ID, Bacteria ENTEROCOCCUS SPECIES   Final  CULTURE, BLOOD (SINGLE)     Status: None   Collection Time    10/21/13  6:00 AM      Result Value Ref Range Status   Specimen Description BLOOD RIGHT FOREARM   Final   Special Requests BOTTLES DRAWN AEROBIC AND ANAEROBIC 10CC EACH   Final   Culture  Setup Time     Final   Value: 10/21/2013 13:30     Performed at [facility]   Culture     Final   Value:        BLOOD CULTURE RECEIVED NO GROWTH TO DATE CULTURE WILL BE HELD FOR 5 DAYS BEFORE ISSUING A FINAL NEGATIVE REPORT     Performed at [facility]   Report Status PENDING   Incomplete  CULTURE, BLOOD (ROUTINE X 2)     Status:  None   Collection Time    10/21/13  9:36 AM      Result Value Ref Range Status   Specimen Description BLOOD RIGHT ARM   Final   Special Requests BOTTLES DRAWN AEROBIC ONLY 8CC   Final   Culture  Setup Time     Final   Value: 10/21/2013 17:23     Performed at [facility]   Culture     Final   Value:        BLOOD CULTURE RECEIVED NO GROWTH TO DATE CULTURE WILL BE HELD FOR 5 DAYS BEFORE ISSUING A FINAL NEGATIVE REPORT     Performed at [facility]   Report Status PENDING   Incomplete  CULTURE, BLOOD (ROUTINE X 2)     Status: None   Collection Time    10/21/13  9:40 AM      Result Value Ref Range Status   Specimen Description BLOOD RIGHT HAND   Final   Special Requests BOTTLES DRAWN AEROBIC ONLY 10CC   Final   Culture  Setup Time     Final   Value: 10/21/2013 17:23     Performed at [facility]   Culture     Final   Value:        BLOOD CULTURE RECEIVED NO GROWTH TO DATE CULTURE WILL BE HELD FOR 5 DAYS BEFORE ISSUING A FINAL NEGATIVE REPORT     Performed at [facility]   Report Status PENDING   Incomplete     Studies: No results found.  Scheduled Meds: . atorvastatin  40 mg Oral q1800  . fenofibrate  160 mg Oral Daily  . heparin  5,000 Units Subcutaneous 3 times per day  . imipenem-cilastatin  500 mg Intravenous 4 times per day  . insulin aspart  0-15 Units Subcutaneous TID WC  . insulin glargine  15 Units Subcutaneous QHS  . mometasone-formoterol  2 puff Inhalation BID  . nicotine  21 mg Transdermal Daily  . pantoprazole  40 mg Oral Daily  . QUEtiapine  400 mg Oral QHS  . sodium chloride  3 mL Intravenous Q12H  . tiotropium  18 mcg Inhalation Daily   Continuous Infusions: . sodium chloride 0.9 % 1,000 mL infusion 75 mL/hr at 10/24/13 4098    Principal Problem:   SIRS (systemic inflammatory response syndrome) Active Problems:   Pancreatitis, acute   TOBACCO ABUSE   DEPRESSIVE DISORDER NOT ELSEWHERE CLASSIFIED   DKA (diabetic ketoacidoses)   Other and unspecified hyperlipidemia   Hypertriglyceridemia   Leukocytosis, unspecified   Abdominal pain, epigastric   Hypokalemia   Enterococcus UTI    Time spent: 40 MINS    Anne Fu [facility] Pager [phone]. If 7PM-7AM, please contact night-coverage at [email or web address], password [facility] 10/24/2013, 8:37 AM  LOS: 7 days

## 2013-10-24 NOTE — Progress Notes (Signed)
ANTIBIOTIC CONSULT NOTE - INITIAL  Pharmacy Consult for Primaxin Indication: Enterococcus UTI; continued fevers  Allergies  Allergen Reactions  . Abilify [Aripiprazole] Hives and Itching  . Amoxicillin Hives, Itching and Rash  . Lithium Hives  . Wellbutrin [Bupropion] Rash    Patient Measurements: Height: 5\' 2"  (157.5 cm) Weight: 154 lb 5.2 oz (70 kg) IBW/kg (Calculated) : 50.1  Vital Signs: Temp: 98.6 F (37 C) (04/14 1153) Temp src: Oral (04/14 1153) BP: 110/65 mmHg (04/14 1400) Pulse Rate: 95 (04/14 1400) Intake/Output from previous day: 04/13 0701 - 04/14 0700 In: 2690 [P.O.:240; I.V.:2250; IV Piggyback:200] Out: 1650 [Urine:1650] Intake/Output from this shift: Total I/O In: 689.2 [I.V.:589.2; IV Piggyback:100] Out: 1500 [Urine:1500]  Labs:  Recent Labs  10/22/13 0830  10/23/13 0720 10/23/13 1415 10/24/13 0250  WBC 18.5*  --  16.1*  --  12.8*  HGB 9.6*  --  9.6*  --  9.0*  PLT 173  --  209  --  231  CREATININE 0.68  < > 0.71 0.62 0.67  < > = values in this interval not displayed. Estimated Creatinine Clearance: 82.3 ml/min (by C-G formula based on Cr of 0.67). No results found for this basename: VANCOTROUGH, Leodis BinetVANCOPEAK, VANCORANDOM, GENTTROUGH, GENTPEAK, GENTRANDOM, TOBRATROUGH, TOBRAPEAK, TOBRARND, AMIKACINPEAK, AMIKACINTROU, AMIKACIN,  in the last 72 hours   Microbiology: Recent Results (from the past 720 hour(s))  RAPID STREP SCREEN     Status: None   Collection Time    10/16/13  6:48 AM      Result Value Ref Range Status   Streptococcus, Group A Screen (Direct) NEGATIVE  NEGATIVE Final   Comment: (NOTE)     A Rapid Antigen test may result negative if the antigen level in the     sample is below the detection level of this test. The FDA has not     cleared this test as a stand-alone test therefore the rapid antigen     negative result has reflexed to a Group A Strep culture.  CULTURE, GROUP A STREP     Status: None   Collection Time    10/16/13   6:48 AM      Result Value Ref Range Status   Specimen Description THROAT   Final   Special Requests ADDED 0715   Final   Culture     Final   Value: No Beta Hemolytic Streptococci Isolated     Performed at [facility]   Report Status 10/18/2013 FINAL   Final  MRSA PCR SCREENING     Status: None   Collection Time    10/17/13 11:50 PM      Result Value Ref Range Status   MRSA by PCR NEGATIVE  NEGATIVE Final   Comment:            The GeneXpert MRSA Assay (FDA     approved for NASAL specimens     only), is one component of a     comprehensive MRSA colonization     surveillance program. It is not     intended to diagnose MRSA     infection nor to guide or     monitor treatment for     MRSA infections.  URINE CULTURE     Status: None   Collection Time    10/21/13  5:13 AM      Result Value Ref Range Status   Specimen Description URINE, CLEAN CATCH   Final   Special Requests NONE   Final  Culture  Setup Time     Final   Value: 10/21/2013 14:43     Performed at [facility]   Colony Count     Final   Value: >=100,000 COLONIES/ML     Performed at [facility]   Culture     Final   Value: ENTEROCOCCUS SPECIES     Performed at [facility]   Report Status 10/23/2013 FINAL   Final   Organism ID, Bacteria ENTEROCOCCUS SPECIES   Final  CULTURE, BLOOD (SINGLE)     Status: None   Collection Time    10/21/13  6:00 AM      Result Value Ref Range Status   Specimen Description BLOOD RIGHT FOREARM   Final   Special Requests BOTTLES DRAWN AEROBIC AND ANAEROBIC 10CC EACH   Final   Culture  Setup Time     Final   Value: 10/21/2013 13:30     Performed at [facility]   Culture     Final   Value:        BLOOD CULTURE RECEIVED NO GROWTH TO DATE CULTURE WILL BE HELD FOR 5 DAYS BEFORE ISSUING A FINAL NEGATIVE REPORT     Performed at [facility]   Report Status PENDING   Incomplete  CULTURE, BLOOD (ROUTINE X 2)     Status: None   Collection  Time    10/21/13  9:36 AM      Result Value Ref Range Status   Specimen Description BLOOD RIGHT ARM   Final   Special Requests BOTTLES DRAWN AEROBIC ONLY 8CC   Final   Culture  Setup Time     Final   Value: 10/21/2013 17:23     Performed at [facility]   Culture     Final   Value:        BLOOD CULTURE RECEIVED NO GROWTH TO DATE CULTURE WILL BE HELD FOR 5 DAYS BEFORE ISSUING A FINAL NEGATIVE REPORT     Performed at [facility]   Report Status PENDING   Incomplete  CULTURE, BLOOD (ROUTINE X 2)     Status: None   Collection Time    10/21/13  9:40 AM      Result Value Ref Range Status   Specimen Description BLOOD RIGHT HAND   Final   Special Requests BOTTLES DRAWN AEROBIC ONLY 10CC   Final   Culture  Setup Time     Final   Value: 10/21/2013 17:23     Performed at [facility]   Culture     Final   Value:        BLOOD CULTURE RECEIVED NO GROWTH TO DATE CULTURE WILL BE HELD FOR 5 DAYS BEFORE ISSUING A FINAL NEGATIVE REPORT     Performed at [facility]   Report Status PENDING   Incomplete   [name] [name] ID following.   Renal function remains stable  Goal of Therapy:  Resolution of infection  Plan:  -Continue Primaxin 500mg  IV Q6H -Monitor progress, cultures, fever curve, renal function  Stacy Pierce, PharmD, BCPS Clinical pharmacist, pager [phone] 10/24/2013,2:49 PM

## 2013-10-24 NOTE — Progress Notes (Signed)
[facility]  Day # 4 imipenem  Subjective: No new complaints except she disliked the food   Antibiotics:  Anti-infectives   Start     Dose/Rate Route Frequency Ordered Stop   10/21/13 0600  imipenem-cilastatin (PRIMAXIN) 500 mg in sodium chloride 0.9 % 100 mL IVPB     500 mg 200 mL/hr over 30 Minutes Intravenous 4 times per day 10/21/13 0517        Medications: Scheduled Meds: . atorvastatin  40 mg Oral q1800  . fenofibrate  160 mg Oral Daily  . heparin  5,000 Units Subcutaneous 3 times per day  . imipenem-cilastatin  500 mg Intravenous 4 times per day  . insulin aspart  0-15 Units Subcutaneous TID WC  . insulin glargine  15 Units Subcutaneous QHS  . mometasone-formoterol  2 puff Inhalation BID  . nicotine  21 mg Transdermal Daily  . pantoprazole  40 mg Oral Daily  . QUEtiapine  400 mg Oral QHS  . sodium chloride  3 mL Intravenous Q12H  . tiotropium  18 mcg Inhalation Daily   Continuous Infusions: . sodium chloride 0.9 % 1,000 mL infusion 75 mL/hr at 10/24/13 0817   PRN Meds:.acetaminophen, cyclobenzaprine, dextrose, LORazepam, ondansetron (ZOFRAN) IV, traMADol    Objective: Weight change:   Intake/Output Summary (Last 24 hours) at 10/24/13 1706 Last data filed at 10/24/13 1400  Gross per 24 hour  Intake 2754.17 ml  Output   2600 ml  Net 154.17 ml   Blood pressure 120/85, pulse 100, temperature 98.7 F (37.1 C), temperature source Oral, resp. rate 18, height 5\' 2"  (1.575 m), weight 154 lb 5.2 oz (70 kg), SpO2 96.00%. Temp:  [97.9 F (36.6 C)-99.2 F (37.3 C)] 98.7 F (37.1 C) (04/14 1640) Pulse Rate:  [93-131] 100 (04/14 1640) Resp:  [14-23] 18 (04/14 1640) BP: (97-133)/(53-88) 120/85 mmHg (04/14 1640) SpO2:  [81 %-100 %] 96 % (04/14 1640) Weight:  [154 lb 5.2 oz (70 kg)] 154 lb 5.2 oz (70 kg) (04/14 0413)  Physical Exam: General: Alert and awake, oriented x3, not in any acute distress. Dysphoric  HEENT: anicteric sclera,  pupils reactive to light and accommodation, EOMI, oropharynx clear and without exudate  CVS regular rate, normal r, no murmur rubs or gallops  Chest: clear to auscultation bilaterally, no wheezing, rales or rhonchi  Abdomen: soft diffusely tender, nondistended, normal bowel sounds,  Extremities: Left knee slightly tender to palpation but no appreciable effusion  Skin: no rashes  Neuro: nonfocal, strength and sensation intact   CBC:  Recent Labs Lab 10/20/13 0330 10/21/13 0459 10/22/13 0830 10/23/13 0720 10/24/13 0250  HGB 10.9* 10.4* 9.6* 9.6* 9.0*  HCT 31.8* 29.3* 28.2* 28.4* 26.3*  PLT 147* 187 173 209 231     BMET  Recent Labs  10/23/13 1415 10/24/13 0250  NA 140 139  K 3.7 4.3  CL 105 102  CO2 21 20  GLUCOSE 169* 152*  BUN 4* 4*  CREATININE 0.62 0.67  CALCIUM 8.7 8.6     Liver Panel   Recent Labs  10/24/13 0250  PROT 6.0  ALBUMIN 2.2*  AST 73*  ALT 60*  ALKPHOS 110  BILITOT 0.6       Sedimentation Rate  Recent Labs  10/24/13 0250  ESRSEDRATE 102*   C-Reactive Protein  Recent Labs  10/24/13 0250  CRP 23.1*    Micro Results: Recent Results (from the past 240 hour(s))  RAPID STREP SCREEN     Status:  None   Collection Time    10/16/13  6:48 AM      Result Value Ref Range Status   Streptococcus, Group A Screen (Direct) NEGATIVE  NEGATIVE Final   Comment: (NOTE)     A Rapid Antigen test may result negative if the antigen level in the     sample is below the detection level of this test. The FDA has not     cleared this test as a stand-alone test therefore the rapid antigen     negative result has reflexed to a Group A Strep culture.  CULTURE, GROUP A STREP     Status: None   Collection Time    10/16/13  6:48 AM      Result Value Ref Range Status   Specimen Description THROAT   Final   Special Requests ADDED 0715   Final   Culture     Final   Value: No Beta Hemolytic Streptococci Isolated     Performed at [facility]     Report Status 10/18/2013 FINAL   Final  MRSA PCR SCREENING     Status: None   Collection Time    10/17/13 11:50 PM      Result Value Ref Range Status   MRSA by PCR NEGATIVE  NEGATIVE Final   Comment:            The GeneXpert MRSA Assay (FDA     approved for NASAL specimens     only), is one component of a     comprehensive MRSA colonization     surveillance program. It is not     intended to diagnose MRSA     infection nor to guide or     monitor treatment for     MRSA infections.  URINE CULTURE     Status: None   Collection Time    10/21/13  5:13 AM      Result Value Ref Range Status   Specimen Description URINE, CLEAN CATCH   Final   Special Requests NONE   Final   Culture  Setup Time     Final   Value: 10/21/2013 14:43     Performed at [facility]   Colony Count     Final   Value: >=100,000 COLONIES/ML     Performed at [facility]   Culture     Final   Value: ENTEROCOCCUS SPECIES     Performed at [facility]   Report Status 10/23/2013 FINAL   Final   Organism ID, Bacteria ENTEROCOCCUS SPECIES   Final  CULTURE, BLOOD (SINGLE)     Status: None   Collection Time    10/21/13  6:00 AM      Result Value Ref Range Status   Specimen Description BLOOD RIGHT FOREARM   Final   Special Requests BOTTLES DRAWN AEROBIC AND ANAEROBIC 10CC EACH   Final   Culture  Setup Time     Final   Value: 10/21/2013 13:30     Performed at [facility]   Culture     Final   Value:        BLOOD CULTURE RECEIVED NO GROWTH TO DATE CULTURE WILL BE HELD FOR 5 DAYS BEFORE ISSUING A FINAL NEGATIVE REPORT     Performed at [facility]   Report Status PENDING   Incomplete  CULTURE, BLOOD (ROUTINE X 2)     Status: None   Collection Time    10/21/13  9:36 AM      Result Value Ref Range Status   Specimen Description BLOOD RIGHT ARM   Final   Special Requests BOTTLES DRAWN AEROBIC ONLY 8CC   Final   Culture  Setup Time     Final   Value: 10/21/2013 17:23      Performed at [facility]   Culture     Final   Value:        BLOOD CULTURE RECEIVED NO GROWTH TO DATE CULTURE WILL BE HELD FOR 5 DAYS BEFORE ISSUING A FINAL NEGATIVE REPORT     Performed at [facility]   Report Status PENDING   Incomplete  CULTURE, BLOOD (ROUTINE X 2)     Status: None   Collection Time    10/21/13  9:40 AM      Result Value Ref Range Status   Specimen Description BLOOD RIGHT HAND   Final   Special Requests BOTTLES DRAWN AEROBIC ONLY 10CC   Final   Culture  Setup Time     Final   Value: 10/21/2013 17:23     Performed at [facility]   Culture     Final   Value:        BLOOD CULTURE RECEIVED NO GROWTH TO DATE CULTURE WILL BE HELD FOR 5 DAYS BEFORE ISSUING A FINAL NEGATIVE REPORT     Performed at [facility]   Report Status PENDING   Incomplete  CLOSTRIDIUM DIFFICILE BY PCR     Status: None   Collection Time    10/24/13 12:59 PM      Result Value Ref Range Status   C difficile by pcr NEGATIVE  NEGATIVE Final    Studies/Results: Ct Chest W Contrast  10/24/2013   CLINICAL DATA:  Fever of unknown origin. Chest discomfort. COPD. Diabetes. Acute pancreatitis.  EXAM: CT CHEST WITH CONTRAST  TECHNIQUE: Multidetector CT imaging of the chest was performed during intravenous contrast administration.  CONTRAST:  80mL OMNIPAQUE IOHEXOL 300 MG/ML  SOLN  COMPARISON:  None.  FINDINGS: No evidence of mediastinal or hilar soft tissue masses. No lymphadenopathy identified within the thorax.  Tiny pleural effusions seen bilaterally. Mild atelectasis seen in the lower lobes bilaterally. There is also patchy ground-glass pulmonary opacity seen throughout both lungs, without focal consolidation or mass. This is nonspecific and consistent with mild edema or other acute alveolitis.  Images obtained through the upper abdomen show diffuse hepatic steatosis. There is pancreatic swelling and soft tissue stranding surrounding the pancreatic body and tail consistent with  acute pancreatitis.  IMPRESSION: Tiny bilateral pleural effusions and bilateral lower lobe subsegmental atelectasis.  Heterogeneous bilateral ground-glass pulmonary opacity, without focal mass or consolidation. This is nonspecific, and consistent with mild edema or other acute alveolitis.  Acute pancreatitis and hepatic steatosis incidentally noted.   Electronically Signed   By: Myles Rosenthal M.D.   On: 10/24/2013 12:25      Assessment/Plan:  Principal Problem:   SIRS (systemic inflammatory response syndrome) Active Problems:   TOBACCO ABUSE   DEPRESSIVE DISORDER NOT ELSEWHERE CLASSIFIED   DKA (diabetic ketoacidoses)   Other and unspecified hyperlipidemia   Hypertriglyceridemia   Leukocytosis, unspecified   Abdominal pain, epigastric   Pancreatitis, acute   Hypokalemia   Enterococcus UTI    Stacy Pierce is a 43 y.o. female with  admission for SOB, chest pain leg pain found on CT to have possible pancreatitis with fevers that persisted despite broad-spectrum antibiotics in the form of imipenem  with persistent fevers  #1 fever of unknown origin:   Could have been due to her pancreatitis. I am underwhelmed by the CT findings in the lungs and pleura. The enterococcus in urine is not a terribly likely explanation given absence of ssx of dysuria, suprapubic or flank pain prior to admission. She does not have CDI. Doppers negative and other serologies have been sent. I dont think her knee is infected.  I would give her no more than 7 days of abx that cover enterococcus in urine (could be switched to macrobid at DC) I can followup her serologies for HIV etc    #2 ? UTI: as above finish 7 days and remainder can be macrobid if ready for DC  If she has further fevers we can re-engage but otherwise I will sign off for now   LOS: 7 days   Randall Hiss 10/24/2013, 5:06 PM

## 2013-10-24 NOTE — ED Provider Notes (Signed)
Medical screening examination/treatment/procedure(s) were performed by non-physician practitioner and as supervising physician I was immediately available for consultation/collaboration.   EKG Interpretation None        Loren Raceravid Nyair Depaulo, MD 10/24/13 [phone]

## 2013-10-25 LAB — BASIC METABOLIC PANEL
BUN: 4 mg/dL — ABNORMAL LOW (ref 6–23)
CO2: 20 mEq/L (ref 19–32)
Calcium: 8.6 mg/dL (ref 8.4–10.5)
Chloride: 100 mEq/L (ref 96–112)
Creatinine, Ser: 0.6 mg/dL (ref 0.50–1.10)
GFR calc Af Amer: >90 mL/min (ref >90)
GFR calc non Af Amer: >90 mL/min (ref >90)
Glucose, Bld: 146 mg/dL — ABNORMAL HIGH (ref 70–99)
Potassium: 3.8 mEq/L (ref 3.7–5.3)
SODIUM: 139 meq/L (ref 137–147)

## 2013-10-25 LAB — CBC WITH DIFFERENTIAL/PLATELET
BASOS PCT: 1 % (ref 0–1)
Basophils Absolute: 0.1 10*3/uL (ref 0.0–0.1)
EOS PCT: 1 % (ref 0–5)
Eosinophils Absolute: 0.1 10*3/uL (ref 0.0–0.7)
HCT: 25.2 % — ABNORMAL LOW (ref 36.0–46.0)
Hemoglobin: 8.8 g/dL — ABNORMAL LOW (ref 12.0–15.0)
LYMPHS ABS: 2.7 10*3/uL (ref 0.7–4.0)
Lymphocytes Relative: 21 % (ref 12–46)
MCH: 31 pg (ref 26.0–34.0)
MCHC: 34.9 g/dL (ref 30.0–36.0)
MCV: 88.7 fL (ref 78.0–100.0)
MONO ABS: 0.9 10*3/uL (ref 0.1–1.0)
Monocytes Relative: 7 % (ref 3–12)
Neutro Abs: 9 10*3/uL — ABNORMAL HIGH (ref 1.7–7.7)
Neutrophils Relative %: 70 % (ref 43–77)
PLATELETS: 293 10*3/uL (ref 150–400)
RBC: 2.84 MIL/uL — AB (ref 3.87–5.11)
RDW: 15.4 % (ref 11.5–15.5)
WBC: 12.8 10*3/uL — ABNORMAL HIGH (ref 4.0–10.5)

## 2013-10-25 LAB — EPSTEIN-BARR VIRUS VCA ANTIBODY PANEL
EBV EA IgG: <5 U/mL (ref <9.0)
EBV NA IGG: 287 U/mL — AB (ref <18.0)

## 2013-10-25 LAB — GLUCOSE, CAPILLARY
GLUCOSE-CAPILLARY: 127 mg/dL — AB (ref 70–99)
GLUCOSE-CAPILLARY: 108 mg/dL — AB (ref 70–99)
Glucose-Capillary: 135 mg/dL — ABNORMAL HIGH (ref 70–99)
Glucose-Capillary: 140 mg/dL — ABNORMAL HIGH (ref 70–99)

## 2013-10-25 LAB — ANA: Anti Nuclear Antibody(ANA): NEGATIVE

## 2013-10-25 LAB — MPO/PR-3 (ANCA) ANTIBODIES
Myeloperoxidase Abs: <1
Serine Protease 3: <1

## 2013-10-25 LAB — MAGNESIUM: Magnesium: 1.6 mg/dL (ref 1.5–2.5)

## 2013-10-25 MED ORDER — NITROFURANTOIN MONOHYD MACRO 100 MG PO CAPS
100.0000 mg | ORAL_CAPSULE | Freq: Two times a day (BID) | ORAL | Status: DC
  Administered 2013-10-25 – 2013-10-26 (×2): 100 mg via ORAL
  Filled 2013-10-25 (×3): qty 1

## 2013-10-25 NOTE — Progress Notes (Signed)
Transferred patient to 6E11 per wheelchair, alert and oriented.  Husband at bedside.

## 2013-10-25 NOTE — Progress Notes (Signed)
[facility] PROGRESS NOTE  Stacy Pierce ZOX:[id number] DOB: 1970/08/14 DOA: 10/17/2013 PCP: Bobbye Riggs, NP  Assessment/Plan: #1 systemic inflammatory response syndrome Questionable etiology. Patient noted to have a temp of 102.3 on 4/13 AM , although being on empiric IV antibiotics. Currently afebrile. WBC trending down. Urine cultures greater than 100,000 enterococcus species which are sensitive to ampicillin. Chest x-ray which was done was negative. CT of the abdomen and pelvis which was done on 10/21/2013 with mild changes of acute pancreatitis. Blood cultures are NTD. Patient denies any diarrhea, however admitted to ID she was having several loose stools. Cdiff PCR negative. CT chest without focal consolidation. ID ff and appreciate input and rxcs-recommends no more than 7 days of antibiotics to cover enterococcus in urine and advises Macrobid at DC'd-Will change. Dopplers negative. - DD: Pancreatitis/UTI.  #2 DKA/New onset DM Questionable etiology. Hemoglobin A1c is 9.8. Patient new-onset DM. Maybe secondary to acute pancreatitis. Patient with prior hx of ETOH abuse and states last drink was in March. Patient states had just been started on latuda prior to admission.Patient denies any chest pain. No shortness of breath. KUB negative. Cardiac enzymes negative x 3. EKG with sinus tachycardia. Patient still has an anion gap of 19. Patient with a bicarbonate of 20. Patient has been transitioned off the glucose stabilizer and currently on subcutaneous Lantus. Continue sliding scale insulin. Continue supportive care. CBGs are reasonably controlled.  #3 anion gap Questionable etiology. May be secondary to acute pancreatitis. Bicarbonate level is now at 20. Patient with no further emesis. Lactic acid level WNL. CT of the abdomen and pelvis done on 10/21/2013 showed mild acute pancreatitis. DC IV fluids. Follow BMP in a.m.  #4 hyperlipidemia/hypertriglyceridemia anemia Patient's triglyceride  level is 939. Continue statin and TriCor. Follow.  #5 Acute pancreatitis/ epig abd pain Lipase level elevated at 1904 has gradually decreased to 181. Clinical improvement. KUB neg. Supportive care. Patient tolerating liquids and wishes to advance to regular diet. CT abd and pelvis with mild acute pancreatitis.  #6. Fever ?? Etiology. Patient with a temperature of 102.3 on 4/13, currently afebrile. WBC trending down. CXR neg. UA neg however urine culture with greater than 100,000 enterococcus species which is sensitive to ampicillin. Blood cultures negative to date. C diff negative. CT abd pelvis c/w mild acute pancreatitis, neg for abscess. Workup and management as above.  #7 Hypokalemia Secondary to #1. Repleted.  #8 depression Stable. Continue seroquel.  #9 COPD Stable. Continue Spiriva and dulera.  #10 tobacco abuse Tobacco cessation. Continue nicotine patch.  #11 leukocytosis Evaluation and management as above. Improved  #12 and prophylaxis PPI for GI prophylaxis. Heparin for DVT prophylaxis.  Anemia - Stable. Follow CBCs  Code Status: Full Family Communication: Discussed with spouse at bedside. Disposition Plan: transfer to telemetry. Possible DC 4/16   Consultants:  ID: Dr Daiva Eves 10/23/13  Procedures:  Chest x-ray 10/17/2013, 10/21/13  Abd xray 10/18/13  CT abd/pelvis 10/21/13  Antibiotics:  IV Primaxin 10/21/13 >4/16  HPI/Subjective: Patient tolerated liquid diet with mild intermittent nausea but no abdominal pain or vomiting. Requesting regular consistency diet. No fevers. Complains of bilateral leg pains left greater than right.  Objective: Filed Vitals:   10/25/13 1130  BP: 113/91  Pulse: 105  Temp: 99.4 F (37.4 C)  Resp: 12    Intake/Output Summary (Last 24 hours) at 10/25/13 1652 Last data filed at 10/25/13 1100  Gross per 24 hour  Intake 2076.25 ml  Output   1450 ml  Net 626.25  ml   Filed Weights   10/20/13 0250 10/24/13 0413 10/25/13  0420  Weight: 70.2 kg (154 lb 12.2 oz) 70 kg (154 lb 5.2 oz) 70 kg (154 lb 5.2 oz)    Exam:   General:  NAD  Cardiovascular: RRR. Telemetry: Sinus rhythm/sinus tachycardia in the 100s.   Respiratory: CTAB  Abdomen: Nondistended, soft and nontender. Normal bowel sounds present.  Musculoskeletal: No c/c/e  Data Reviewed: Basic Metabolic Panel:  Recent Labs Lab 10/19/13 1610 10/19/13 1915  10/20/13 0921  10/21/13 1222  10/23/13 0335 10/23/13 0720 10/23/13 1415 10/24/13 0250 10/25/13 0250  NA 142 145  < >  --   < > 137  < > 134* 138 140 139 139  K 3.8 3.5*  < >  --   < > 3.6*  < > 4.2 4.1 3.7 4.3 3.8  CL 110 111  < >  --   < > 103  < > 99 100 105 102 100  CO2 16* 18*  < >  --   < > 19  < > 20 21 21 20 20   GLUCOSE 124* 171*  < >  --   < > 153*  < > 227* 242* 169* 152* 146*  BUN 3* <3*  < >  --   < > 4*  < > 3* 3* 4* 4* 4*  CREATININE 0.58 0.58  < >  --   < > 0.63  < > 0.63 0.71 0.62 0.67 0.60  CALCIUM 7.7* 8.2*  < >  --   < > 8.5  < > 8.6 8.5 8.7 8.6 8.6  MG  --  1.6  --  1.5  --  1.9  --   --   --   --   --  1.6  < > = values in this interval not displayed. Liver Function Tests:  Recent Labs Lab 10/24/13 0250  AST 73*  ALT 60*  ALKPHOS 110  BILITOT 0.6  PROT 6.0  ALBUMIN 2.2*    Recent Labs Lab 10/19/13 0240 10/20/13 0330 10/21/13 1222 10/22/13 0830 10/24/13 0250  LIPASE 570* 242* 444* 227* 181*   No results found for this basename: AMMONIA,  in the last 168 hours CBC:  Recent Labs Lab 10/21/13 0459 10/22/13 0830 10/23/13 0720 10/24/13 0250 10/25/13 0250  WBC 14.5* 18.5* 16.1* 12.8* 12.8*  NEUTROABS  --  14.6* 12.4* 9.6* 9.0*  HGB 10.4* 9.6* 9.6* 9.0* 8.8*  HCT 29.3* 28.2* 28.4* 26.3* 25.2*  MCV 86.7 87.9 88.2 88.6 88.7  PLT 187 173 209 231 293   Cardiac Enzymes:  Recent Labs Lab 10/18/13 2222 10/24/13 0250  CKTOTAL  --  66  TROPONINI <0.30  --    BNP (last 3 results) No results found for this basename: PROBNP,  in the last 8760  hours CBG:  Recent Labs Lab 10/24/13 1207 10/24/13 1703 10/24/13 2130 10/25/13 0803 10/25/13 1133  GLUCAP 168* 98 136* 135* 127*    Recent Results (from the past 240 hour(s))  RAPID STREP SCREEN     Status: None   Collection Time    10/16/13  6:48 AM      Result Value Ref Range Status   Streptococcus, Group A Screen (Direct) NEGATIVE  NEGATIVE Final   Comment: (NOTE)     A Rapid Antigen test may result negative if the antigen level in the     sample is below the detection level of this test. The FDA has not  cleared this test as a stand-alone test therefore the rapid antigen     negative result has reflexed to a Group A Strep culture.  CULTURE, GROUP A STREP     Status: None   Collection Time    10/16/13  6:48 AM      Result Value Ref Range Status   Specimen Description THROAT   Final   Special Requests ADDED 0715   Final   Culture     Final   Value: No Beta Hemolytic Streptococci Isolated     Performed at [facility]   Report Status 10/18/2013 FINAL   Final  MRSA PCR SCREENING     Status: None   Collection Time    10/17/13 11:50 PM      Result Value Ref Range Status   MRSA by PCR NEGATIVE  NEGATIVE Final   Comment:            The GeneXpert MRSA Assay (FDA     approved for NASAL specimens     only), is one component of a     comprehensive MRSA colonization     surveillance program. It is not     intended to diagnose MRSA     infection nor to guide or     monitor treatment for     MRSA infections.  URINE CULTURE     Status: None   Collection Time    10/21/13  5:13 AM      Result Value Ref Range Status   Specimen Description URINE, CLEAN CATCH   Final   Special Requests NONE   Final   Culture  Setup Time     Final   Value: 10/21/2013 14:43     Performed at [place]     Final   Value: >=100,000 COLONIES/ML     Performed at [facility]   Culture     Final   Value: ENTEROCOCCUS SPECIES     Performed at [facility]   Report Status 10/23/2013 FINAL   Final   Organism ID, Bacteria ENTEROCOCCUS SPECIES   Final  CULTURE, BLOOD (SINGLE)     Status: None   Collection Time    10/21/13  6:00 AM      Result Value Ref Range Status   Specimen Description BLOOD RIGHT FOREARM   Final   Special Requests BOTTLES DRAWN AEROBIC AND ANAEROBIC 10CC EACH   Final   Culture  Setup Time     Final   Value: 10/21/2013 13:30     Performed at [facility]   Culture     Final   Value:        BLOOD CULTURE RECEIVED NO GROWTH TO DATE CULTURE WILL BE HELD FOR 5 DAYS BEFORE ISSUING A FINAL NEGATIVE REPORT     Performed at [facility]   Report Status PENDING   Incomplete  CULTURE, BLOOD (ROUTINE X 2)     Status: None   Collection Time    10/21/13  9:36 AM      Result Value Ref Range Status   Specimen Description BLOOD RIGHT ARM   Final   Special Requests BOTTLES DRAWN AEROBIC ONLY 8CC   Final   Culture  Setup Time     Final   Value: 10/21/2013 17:23     Performed at [facility]   Culture     Final   Value:  BLOOD CULTURE RECEIVED NO GROWTH TO DATE CULTURE WILL BE HELD FOR 5 DAYS BEFORE ISSUING A FINAL NEGATIVE REPORT     Performed at [facility]   Report Status PENDING   Incomplete  CULTURE, BLOOD (ROUTINE X 2)     Status: None   Collection Time    10/21/13  9:40 AM      Result Value Ref Range Status   Specimen Description BLOOD RIGHT HAND   Final   Special Requests BOTTLES DRAWN AEROBIC ONLY 10CC   Final   Culture  Setup Time     Final   Value: 10/21/2013 17:23     Performed at [facility]   Culture     Final   Value:        BLOOD CULTURE RECEIVED NO GROWTH TO DATE CULTURE WILL BE HELD FOR 5 DAYS BEFORE ISSUING A FINAL NEGATIVE REPORT     Performed at [facility]   Report Status PENDING   Incomplete  CLOSTRIDIUM DIFFICILE BY PCR     Status: None   Collection Time    10/24/13 12:59 PM      Result Value Ref Range Status   C difficile by pcr  NEGATIVE  NEGATIVE Final     Studies: Ct Chest W Contrast  10/24/2013   CLINICAL DATA:  Fever of unknown origin. Chest discomfort. COPD. Diabetes. Acute pancreatitis.  EXAM: CT CHEST WITH CONTRAST  TECHNIQUE: Multidetector CT imaging of the chest was performed during intravenous contrast administration.  CONTRAST:  80mL OMNIPAQUE IOHEXOL 300 MG/ML  SOLN  COMPARISON:  None.  FINDINGS: No evidence of mediastinal or hilar soft tissue masses. No lymphadenopathy identified within the thorax.  Tiny pleural effusions seen bilaterally. Mild atelectasis seen in the lower lobes bilaterally. There is also patchy ground-glass pulmonary opacity seen throughout both lungs, without focal consolidation or mass. This is nonspecific and consistent with mild edema or other acute alveolitis.  Images obtained through the upper abdomen show diffuse hepatic steatosis. There is pancreatic swelling and soft tissue stranding surrounding the pancreatic body and tail consistent with acute pancreatitis.  IMPRESSION: Tiny bilateral pleural effusions and bilateral lower lobe subsegmental atelectasis.  Heterogeneous bilateral ground-glass pulmonary opacity, without focal mass or consolidation. This is nonspecific, and consistent with mild edema or other acute alveolitis.  Acute pancreatitis and hepatic steatosis incidentally noted.   Electronically Signed   By: Myles Rosenthal M.D.   On: 10/24/2013 12:25    Scheduled Meds: . atorvastatin  40 mg Oral q1800  . fenofibrate  160 mg Oral Daily  . heparin  5,000 Units Subcutaneous 3 times per day  . imipenem-cilastatin  500 mg Intravenous 4 times per day  . insulin aspart  0-15 Units Subcutaneous TID WC  . insulin glargine  15 Units Subcutaneous QHS  . mometasone-formoterol  2 puff Inhalation BID  . nicotine  21 mg Transdermal Daily  . pantoprazole  40 mg Oral Daily  . QUEtiapine  400 mg Oral QHS  . sodium chloride  3 mL Intravenous Q12H  . tiotropium  18 mcg Inhalation Daily    Continuous Infusions: . sodium chloride 0.9 % 1,000 mL infusion 75 mL/hr at 10/24/13 2120    Principal Problem:   SIRS (systemic inflammatory response syndrome) Active Problems:   TOBACCO ABUSE   DEPRESSIVE DISORDER NOT ELSEWHERE CLASSIFIED   DKA (diabetic ketoacidoses)   Other and unspecified hyperlipidemia   Hypertriglyceridemia   Leukocytosis, unspecified   Abdominal pain, epigastric   Pancreatitis,  acute   Hypokalemia   Enterococcus UTI    Time spent: 20 MINS    Maximino Greenlandnand D [facility] Pager [phone]. If 7PM-7AM, please contact night-coverage at [email or web address], password [facility] 10/25/2013, 4:52 PM  LOS: 8 days

## 2013-10-26 LAB — LIPASE, BLOOD: Lipase: 128 U/L — ABNORMAL HIGH (ref 11–59)

## 2013-10-26 LAB — CBC
HCT: 26.9 % — ABNORMAL LOW (ref 36.0–46.0)
HEMOGLOBIN: 8.9 g/dL — AB (ref 12.0–15.0)
MCH: 29.7 pg (ref 26.0–34.0)
MCHC: 33.1 g/dL (ref 30.0–36.0)
MCV: 89.7 fL (ref 78.0–100.0)
Platelets: 347 10*3/uL (ref 150–400)
RBC: 3 MIL/uL — ABNORMAL LOW (ref 3.87–5.11)
RDW: 15.5 % (ref 11.5–15.5)
WBC: 14.6 10*3/uL — ABNORMAL HIGH (ref 4.0–10.5)

## 2013-10-26 LAB — COMPREHENSIVE METABOLIC PANEL
ALT: 55 U/L — AB (ref 0–35)
AST: 55 U/L — ABNORMAL HIGH (ref 0–37)
Albumin: 2.3 g/dL — ABNORMAL LOW (ref 3.5–5.2)
Alkaline Phosphatase: 116 U/L (ref 39–117)
BUN: 4 mg/dL — ABNORMAL LOW (ref 6–23)
CO2: 22 meq/L (ref 19–32)
Calcium: 8.9 mg/dL (ref 8.4–10.5)
Chloride: 98 mEq/L (ref 96–112)
Creatinine, Ser: 0.59 mg/dL (ref 0.50–1.10)
GFR calc non Af Amer: >90 mL/min (ref >90)
GLUCOSE: 116 mg/dL — AB (ref 70–99)
Potassium: 3.6 mEq/L — ABNORMAL LOW (ref 3.7–5.3)
SODIUM: 140 meq/L (ref 137–147)
TOTAL PROTEIN: 6.3 g/dL (ref 6.0–8.3)
Total Bilirubin: 0.5 mg/dL (ref 0.3–1.2)

## 2013-10-26 LAB — CMV ANTIBODY, IGG (EIA): CMV Ab - IgG: 1.3 U/mL — ABNORMAL HIGH (ref <0.60)

## 2013-10-26 LAB — CMV IGM

## 2013-10-26 LAB — GLUCOSE, CAPILLARY
Glucose-Capillary: 123 mg/dL — ABNORMAL HIGH (ref 70–99)
Glucose-Capillary: 153 mg/dL — ABNORMAL HIGH (ref 70–99)

## 2013-10-26 MED ORDER — NICOTINE 21 MG/24HR TD PT24: 21.0000 mg | MEDICATED_PATCH | Freq: Every day | TRANSDERMAL | Status: DC

## 2013-10-26 MED ORDER — TIOTROPIUM BROMIDE MONOHYDRATE 18 MCG IN CAPS: 18.0000 ug | ORAL_CAPSULE | Freq: Every day | RESPIRATORY_TRACT | Status: DC

## 2013-10-26 MED ORDER — ATORVASTATIN CALCIUM 40 MG PO TABS: 40.0000 mg | ORAL_TABLET | Freq: Every day | ORAL | Status: DC

## 2013-10-26 MED ORDER — FENOFIBRATE 160 MG PO TABS: 160.0000 mg | ORAL_TABLET | Freq: Every day | ORAL | Status: DC

## 2013-10-26 MED ORDER — PANTOPRAZOLE SODIUM 40 MG PO TBEC: 40.0000 mg | DELAYED_RELEASE_TABLET | Freq: Every day | ORAL | Status: DC

## 2013-10-26 MED ORDER — NITROFURANTOIN MONOHYD MACRO 100 MG PO CAPS: 100.0000 mg | ORAL_CAPSULE | Freq: Two times a day (BID) | ORAL | Status: DC

## 2013-10-26 MED ORDER — ACETAMINOPHEN 325 MG PO TABS: 650.0000 mg | ORAL_TABLET | ORAL | Status: DC | PRN

## 2013-10-26 MED ORDER — INSULIN GLARGINE 100 UNIT/ML ~~LOC~~ SOLN: 15.0000 [IU] | Freq: Every day | SUBCUTANEOUS | Status: DC

## 2013-10-26 MED ORDER — MOMETASONE FURO-FORMOTEROL FUM 100-5 MCG/ACT IN AERO: 2.0000 | INHALATION_SPRAY | Freq: Two times a day (BID) | RESPIRATORY_TRACT | Status: DC

## 2013-10-26 MED ORDER — INSULIN ASPART 100 UNIT/ML ~~LOC~~ SOLN: 0.0000 [IU] | Freq: Three times a day (TID) | SUBCUTANEOUS | Status: DC

## 2013-10-26 MED ORDER — ALBUTEROL SULFATE HFA 108 (90 BASE) MCG/ACT IN AERS: 2.0000 | INHALATION_SPRAY | Freq: Four times a day (QID) | RESPIRATORY_TRACT | Status: DC | PRN

## 2013-10-26 NOTE — Discharge Summary (Signed)
Physician Discharge Summary  BRANDYN THIEN ZOX:[id number] DOB: [birth year] DOA: 10/17/2013  PCP: [name], NP  Admit date: 10/17/2013 Discharge date: 10/26/2013  Time spent: Greater than 30 minutes  Recommendations for Outpatient Follow-up:  1. [name], NP/PCP in 4 days with repeat labs (CBC, CMP, lipase & fasting lipids). Follow final blood culture results that were sent from the hospital. Will need a repeat fasting lipids in a couple of months. 2. Outpatient diabetes education   Discharge Diagnoses:  Principal Problem:   SIRS (systemic inflammatory response syndrome) Active Problems:   TOBACCO ABUSE   DEPRESSIVE DISORDER NOT ELSEWHERE CLASSIFIED   DKA (diabetic ketoacidoses)   Other and unspecified hyperlipidemia   Hypertriglyceridemia   Leukocytosis, unspecified   Abdominal pain, epigastric   Pancreatitis, acute   Hypokalemia   Enterococcus UTI   Discharge Condition: Improved & Stable  Diet recommendation: Heart healthy and diabetic diet  Filed Weights   10/20/13 0250 10/24/13 0413 10/25/13 0420  Weight: 70.2 kg (154 lb 12.2 oz) 70 kg (154 lb 5.2 oz) 70 kg (154 lb 5.2 oz)    History of present illness:  43 year old female patient with history of anxiety, depression, bipolar disorder, COPD, gallstones, was admitted to the hospital on 10/17/13 when she presented with worsening dyspnea. She was however found to have glucose of 812, bicarbonate of 7 and anion gap of 39.  Hospital Course:   1. DKA, in new onset DM-possibly type II: Patient was admitted to step down unit. She was aggressively hydrated with IV fluids and started on IV insulin drip as per DKA protocol. Once her DKA had resolved, she was transitioned to Lantus and SSI. She has been educated to self administer these medications and regarding diabetes management. Her current inpatient CBG control is reasonable as outlined below. Although her anion gap is 20, do not believe she is in DKA. Hemoglobin A1c:  9.8. 2. Dehydration: Resolved after IV fluids 3. Anxiety and depression: Stable on home medications. 4. Tobacco abuse: Cessation counseled. Continue nicotine patch. 5. Acute pancreatitis: Unclear etiology. May have been secondary to hyper triglyceridemia. Although she has prior history of alcohol abuse, she stated that her last drink was in March. Lipase has gradually trended down. Patient is asymptomatic of abdominal pain, nausea or vomiting. She is tolerated regular consistency diet. 6. Dyslipidemia/hypertriglyceridemia: Triglycerides were 939. Would have preferred to hold off on medications until they were repeated after prolonged period of reasonable diabetes control. In any event patient was started on statins and TriCor. Patient is tolerating these medications. Outpatient followup. 7. COPD: Patient was started on [place]. Has been stable 4 days. Her initial dyspnea and may have been secondary to the DKA itself. Tobacco cessation counseled. 8. Leukocytosis: Improved. 9. Hypokalemia: Repleted 10. Fever/SIRS: Unclear etiology. She spiked temperature of 102.3 on 3/13. Chest x-ray was negative. CT abdomen and pelvis showed mild changes of acute pancreatitis. This may have been secondary to acute pancreatitis or enterococcus UTI. Infectious disease was consulted. Lower extremity venous Dopplers were negative. ID recommended total 7 days of antibiotics covering for enterococcus. She will be discharged on Macrobid. She has been afebrile. Extensive workup for this was otherwise negative. She was initially treated with a few days of IV Primaxin. As per infectious disease M.D., CT findings of chest were not significant. 11. Bilateral leg pains: Unclear etiology.? Peripheral neuropathy. Lower extremity venous Dopplers negative. No acute findings on physical exam.  Consultations:  Infectious disease  Procedures:  None    Discharge  Exam:  Complaints:  Denies complaints and anxious to go  home. No nausea, vomiting or abdominal pain. Tolerating regular consistency diet. Mild intermittent leg pains. No fevers.  Filed Vitals:   10/26/13 0541 10/26/13 0820 10/26/13 0826 10/26/13 1225  BP: 110/81  114/71 131/79  Pulse: 86  103 89  Temp: 98.3 F (36.8 C)  97.7 F (36.5 C) 98.6 F (37 C)  TempSrc: Oral  Oral Oral  Resp: _0 Height:      Weight:      SpO2: 100% 98% 97% 100%    General exam: Pleasant young female sitting up comfortably in bed. Respiratory system: Clear. No increased work of breathing. Cardiovascular system: S1 & S2 heard, RRR. No JVD, murmurs, gallops, clicks or pedal edema. Gastrointestinal system: Abdomen is nondistended, soft and nontender. Normal bowel sounds heard. Central nervous system: Alert and oriented. No focal neurological deficits. Extremities: Symmetric 5 x 5 power. No acute findings of lower extremities. Good peripheral pulses felt.  Discharge Instructions  Discharge Orders   Future Orders Complete By Expires   Ambulatory referral to Nutrition and Diabetic Education  As directed    Ambulatory referral to Nutrition and Diabetic Education  As directed    Call MD for:  difficulty breathing, headache or visual disturbances  As directed    Call MD for:  persistant nausea and vomiting  As directed    Call MD for:  severe uncontrolled pain  As directed    Diet - low sodium heart healthy  As directed    Diet Carb Modified  As directed    Increase activity slowly  As directed        Medication List    STOP taking these medications       ibuprofen 200 MG tablet  Commonly known as:  ADVIL,MOTRIN      TAKE these medications       acetaminophen 325 MG tablet  Commonly known as:  TYLENOL  Take 2 tablets (650 mg total) by mouth every 4 (four) hours as needed for mild pain, moderate pain, fever or headache.     albuterol 108 (90 BASE) MCG/ACT inhaler  Commonly known as:  PROVENTIL HFA;VENTOLIN HFA  Inhale 2 puffs into the lungs  every 6 (six) hours as needed for wheezing or shortness of breath.     atorvastatin 40 MG tablet  Commonly known as:  LIPITOR  Take 1 tablet (40 mg total) by mouth daily at 6 PM.     clonazePAM 0.5 MG tablet  Commonly known as:  KLONOPIN  Take 0.5 mg by mouth 2 (two) times daily as needed for anxiety.     fenofibrate 160 MG tablet  Take 1 tablet (160 mg total) by mouth daily.     insulin aspart 100 UNIT/ML injection  Commonly known as:  novoLOG  - Inject 0-15 Units into the skin 3 (three) times daily with meals. CBG < 70: eat or drink something sweet and recheck,  - CBG 70 - 120: 0 units  - CBG 121 - 150: 2 units  - CBG 151 - 200: 3 units  - CBG 201 - 250: 5 units  - CBG 251 - 300: 8 units  - CBG 301 - 350: 11 units  - CBG 351 - 400: 15 units  - CBG > 400: call MD     insulin glargine 100 UNIT/ML injection  Commonly known as:  LANTUS  Inject 0.15 mLs (15 Units total) into the skin  at bedtime.     mometasone-formoterol 100-5 MCG/ACT Aero  Commonly known as:  DULERA  Inhale 2 puffs into the lungs 2 (two) times daily.     nicotine 21 mg/24hr patch  Commonly known as:  NICODERM CQ - dosed in mg/24 hours  Place 1 patch (21 mg total) onto the skin daily.     nitrofurantoin (macrocrystal-monohydrate) 100 MG capsule  Commonly known as:  MACROBID  Take 1 capsule (100 mg total) by mouth every 12 (twelve) hours.     pantoprazole 40 MG tablet  Commonly known as:  PROTONIX  Take 1 tablet (40 mg total) by mouth daily.     QUEtiapine 400 MG tablet  Commonly known as:  SEROQUEL  Take 400 mg by mouth at bedtime.     tiotropium 18 MCG inhalation capsule  Commonly known as:  SPIRIVA  Place 1 capsule (18 mcg total) into inhaler and inhale daily.           Follow-up Information   Follow up with [name], NP. Schedule an appointment as soon as possible for a visit in 4 days. ([facility] discharge follow up. to be seen with repeat labs (CBC, CMP, Lipase, Fasting  Lipids))    Specialty:  Family Medicine   Contact information:   [address] [place] [address] [phone]        The results of significant diagnostics from this hospitalization (including imaging, microbiology, ancillary and laboratory) are listed below for reference.    Significant Diagnostic Studies: Dg Chest 2 View  10/17/2013   CLINICAL DATA:  Shortness of breath.  Vomiting and chest pain.  EXAM: CHEST  2 VIEW  COMPARISON:  PA and lateral chest 03/22/2012.  FINDINGS: The lungs are clear. Heart size is normal. No pneumothorax or pleural effusion. Cholecystectomy clips are noted.  IMPRESSION: No acute disease.   Electronically Signed   By: Inge Rise M.D.   On: 10/17/2013 17:45   Dg Abd 1 View  10/18/2013   CLINICAL DATA:  Abdominal pain and nausea  EXAM: ABDOMEN - 1 VIEW  COMPARISON:  None.  FINDINGS: Bowel gas pattern is unremarkable. No obstruction or free air is seen on this supine examination. There are several 1 mm calculi in the mid to lower pole right kidney. There is an intrauterine device in the mid-pelvis. There are apparent phleboliths in the pelvis. There are surgical clips in the right upper quadrant region.  IMPRESSION: Bowel gas pattern unremarkable.  Intrauterine device in mid pelvis.   Electronically Signed   By: Lowella Grip M.D.   On: 10/18/2013 12:02   Ct Chest W Contrast  10/24/2013   CLINICAL DATA:  Fever of unknown origin. Chest discomfort. COPD. Diabetes. Acute pancreatitis.  EXAM: CT CHEST WITH CONTRAST  TECHNIQUE: Multidetector CT imaging of the chest was performed during intravenous contrast administration.  CONTRAST:  30m OMNIPAQUE IOHEXOL 300 MG/ML  SOLN  COMPARISON:  None.  FINDINGS: No evidence of mediastinal or hilar soft tissue masses. No lymphadenopathy identified within the thorax.  Tiny pleural effusions seen bilaterally. Mild atelectasis seen in the lower lobes bilaterally. There is also patchy ground-glass pulmonary opacity seen  throughout both lungs, without focal consolidation or mass. This is nonspecific and consistent with mild edema or other acute alveolitis.  Images obtained through the upper abdomen show diffuse hepatic steatosis. There is pancreatic swelling and soft tissue stranding surrounding the pancreatic body and tail consistent with acute pancreatitis.  IMPRESSION: Tiny bilateral pleural effusions and  bilateral lower lobe subsegmental atelectasis.  Heterogeneous bilateral ground-glass pulmonary opacity, without focal mass or consolidation. This is nonspecific, and consistent with mild edema or other acute alveolitis.  Acute pancreatitis and hepatic steatosis incidentally noted.   Electronically Signed   By: Earle Gell M.D.   On: 10/24/2013 12:25   Ct Abdomen Pelvis W Contrast  10/21/2013   CLINICAL DATA:  New onset diabetes possibly secondary to acute pancreatitis, with fever and diffuse abdominal pain, evaluate for pancreatitis  EXAM: CT ABDOMEN AND PELVIS WITH CONTRAST  TECHNIQUE: Multidetector CT imaging of the abdomen and pelvis was performed using the standard protocol following bolus administration of intravenous contrast.  CONTRAST:  151m OMNIPAQUE IOHEXOL 300 MG/ML  SOLN  COMPARISON:  DG ABD 1 VIEW dated 10/18/2013; [place] COMPLETE dated 11/08/2011  FINDINGS: Small bilateral pleural effusions. Hazy midlung densities, likely exaggerated by motion artifact.  Significant diffuse fatty infiltration of the liver. Gallbladder is surgically absent. Spleen is normal. There is mild inflammatory change adjacent to the tail of the pancreas. A very small volume of fluid extends posterior to the gastric fundus. Pancreas is otherwise normal. Adrenal glands are normal. Left kidney is normal. Right kidney shows a 2 mm upper pole and a 3 mm lower pole nonobstructing stone.  Abdominal aorta appears normal.  Bowel is normal.  Bladder is normal. There is an intrauterine device noted. Ovaries are normal. There is trace free fluid in  the cul-de-sac of the pelvis.  There are no acute musculoskeletal findings. There is oral contrast into the cecum.  IMPRESSION: Findings suggest possibility of mild pulmonary edema  Significant fatty infiltration of the liver  Mild inflammatory change in the region of the tail of the pancreas which suggests the possibility of mild acute pancreatitis.   Electronically Signed   By: RSkipper ClicheM.D.   On: 10/21/2013 15:44   Dg Chest Port 1 View  10/21/2013   CLINICAL DATA:  Fever.  EXAM: PORTABLE CHEST - 1 VIEW  COMPARISON:  10/17/2013  FINDINGS: Heart, mediastinum hila unremarkable.  There is minor mid lung zone reticular type opacity that is most likely atelectasis accentuated by relatively low lung volumes and the semi-erect positioning. There is no convincing infiltrate. No pulmonary edema. No pleural effusion or pneumothorax.  IMPRESSION: No acute cardiopulmonary disease.   Electronically Signed   By: DLajean ManesM.D.   On: 10/21/2013 09:26    Microbiology: Recent Results (from the past 240 hour(s))  MRSA PCR SCREENING     Status: None   Collection Time    10/17/13 11:50 PM      Result Value Ref Range Status   MRSA by PCR NEGATIVE  NEGATIVE Final   Comment:            The GeneXpert MRSA Assay (FDA     approved for NASAL specimens     only), is one component of a     comprehensive MRSA colonization     surveillance program. It is not     intended to diagnose MRSA     infection nor to guide or     monitor treatment for     MRSA infections.  URINE CULTURE     Status: None   Collection Time    10/21/13  5:13 AM      Result Value Ref Range Status   Specimen Description URINE, CLEAN CATCH   Final   Special Requests NONE   Final   Culture  Setup Time  Final   Value: 10/21/2013 14:43     Performed at [facility] Count     Final   Value: >=100,000 COLONIES/ML     Performed at [facility]   Culture     Final   Value: ENTEROCOCCUS SPECIES     Performed  at [facility]   Report Status 10/23/2013 FINAL   Final   Organism ID, Bacteria ENTEROCOCCUS SPECIES   Final  CULTURE, BLOOD (SINGLE)     Status: None   Collection Time    10/21/13  6:00 AM      Result Value Ref Range Status   Specimen Description BLOOD RIGHT FOREARM   Final   Special Requests BOTTLES DRAWN AEROBIC AND ANAEROBIC 10CC EACH   Final   Culture  Setup Time     Final   Value: 10/21/2013 13:30     Performed at [facility]   Culture     Final   Value:        BLOOD CULTURE RECEIVED NO GROWTH TO DATE CULTURE WILL BE HELD FOR 5 DAYS BEFORE ISSUING A FINAL NEGATIVE REPORT     Performed at [facility]   Report Status PENDING   Incomplete  CULTURE, BLOOD (ROUTINE X 2)     Status: None   Collection Time    10/21/13  9:36 AM      Result Value Ref Range Status   Specimen Description BLOOD RIGHT ARM   Final   Special Requests BOTTLES DRAWN AEROBIC ONLY 8CC   Final   Culture  Setup Time     Final   Value: 10/21/2013 17:23     Performed at [facility]   Culture     Final   Value:        BLOOD CULTURE RECEIVED NO GROWTH TO DATE CULTURE WILL BE HELD FOR 5 DAYS BEFORE ISSUING A FINAL NEGATIVE REPORT     Performed at [facility]   Report Status PENDING   Incomplete  CULTURE, BLOOD (ROUTINE X 2)     Status: None   Collection Time    10/21/13  9:40 AM      Result Value Ref Range Status   Specimen Description BLOOD RIGHT HAND   Final   Special Requests BOTTLES DRAWN AEROBIC ONLY 10CC   Final   Culture  Setup Time     Final   Value: 10/21/2013 17:23     Performed at [facility]   Culture     Final   Value:        BLOOD CULTURE RECEIVED NO GROWTH TO DATE CULTURE WILL BE HELD FOR 5 DAYS BEFORE ISSUING A FINAL NEGATIVE REPORT     Performed at [facility]   Report Status PENDING   Incomplete  CLOSTRIDIUM DIFFICILE BY PCR     Status: None   Collection Time    10/24/13 12:59 PM      Result Value Ref Range Status   C difficile  by pcr NEGATIVE  NEGATIVE Final     Labs: Basic Metabolic Panel:  Recent Labs Lab 10/19/13 1610 10/19/13 1915  10/20/13 0921  10/21/13 1222  10/23/13 0720 10/23/13 1415 10/24/13 0250 10/25/13 0250 10/26/13 0500  NA 142 145  < >  --   < > 137  < > 138 140 139 139 140  K 3.8 3.5*  < >  --   < > 3.6*  < > 4.1  3.7 4.3 3.8 3.6*  CL 110 111  < >  --   < > 103  < > 100 105 102 100 98  CO2 16* 18*  < >  --   < > 19  < > _0 GLUCOSE 124* 171*  < >  --   < > 153*  < > 242* 169* 152* 146* 116*  BUN 3* <3*  < >  --   < > 4*  < > 3* 4* 4* 4* 4*  CREATININE 0.58 0.58  < >  --   < > 0.63  < > 0.71 0.62 0.67 0.60 0.59  CALCIUM 7.7* 8.2*  < >  --   < > 8.5  < > 8.5 8.7 8.6 8.6 8.9  MG  --  1.6  --  1.5  --  1.9  --   --   --   --  1.6  --   < > = values in this interval not displayed. Liver Function Tests:  Recent Labs Lab 10/24/13 0250 10/26/13 0500  AST 73* 55*  ALT 60* 55*  ALKPHOS 110 116  BILITOT 0.6 0.5  PROT 6.0 6.3  ALBUMIN 2.2* 2.3*    Recent Labs Lab 10/20/13 0330 10/21/13 1222 10/22/13 0830 10/24/13 0250 10/26/13 0500  LIPASE 242* 444* 227* 181* 128*   No results found for this basename: AMMONIA,  in the last 168 hours CBC:  Recent Labs Lab 10/22/13 0830 10/23/13 0720 10/24/13 0250 10/25/13 0250 10/26/13 0500  WBC 18.5* 16.1* 12.8* 12.8* 14.6*  NEUTROABS 14.6* 12.4* 9.6* 9.0*  --   HGB 9.6* 9.6* 9.0* 8.8* 8.9*  HCT 28.2* 28.4* 26.3* 25.2* 26.9*  MCV 87.9 88.2 88.6 88.7 89.7  PLT 173 209 231 293 347   Cardiac Enzymes:  Recent Labs Lab 10/24/13 0250  CKTOTAL 66   BNP: BNP (last 3 results) No results found for this basename: PROBNP,  in the last 8760 hours CBG:  Recent Labs Lab 10/25/13 1133 10/25/13 1701 10/25/13 2240 10/26/13 0824 10/26/13 1225  GLUCAP 127* 140* 108* 123* 153*    Additional labs: 1. Peak lipase: 1904 2. Lipid panel 10/18/13: Cholesterol 222, triglycerides 939, HDL 29. Unable to calculate LDL and  VLDL 3. Ferritin: 2477 4. CRP 23.1 and ESR 102 5. Hemoglobin A1c: 9.8 6. ANA negative. Rheumatoid factor 17, myeloperoxidase antibodies <1, serine protease 3 < 1 7. RPR: Nonreactive 8. Acute hepatitis panel: Negative 9. HIV-antibody: Nonreactive 10. EBV VCA IgG >750, EBV NA IgG 287 and CMV AB-Ig G1 0.3. All IgM's were negative. Unclear significance of these results 11. Lower extremity venous Dopplers 10/23/13: Summary:  - No evidence of deep vein or superficial thrombosis involving the right lower extremity and left lower extremity. Enlarged inguinal lymph nodes are noted bilaterally. - No evidence of Baker's cyst on the right or left.     Signed:  Modena Jansky, MD, FACP, [facility]. [facility] Pager [phone]  If 7PM-7AM, please contact night-coverage [email or web address] Password [facility] 10/26/2013, 3:25 PM

## 2013-10-26 NOTE — Progress Notes (Addendum)
Talked with patient at length regarding her home situation and how she would take her meds. Her primary problem she states to be where she will go to live once she is evicted, she says tomorrow night. Pt states she is fine with using insulin and has been taught how to use her pen. The concern now is more where she will live to take her meds and store them. Pt is happy to be going home for now. Thank you, Lenor CoffinAnn Ruthell Feigenbaum, RN, CNS, Diabetes Coordinator [phone]) Noted MD's intention to [facility], however realistically it is doubtful that she will go due to many issues including transportation.

## 2013-10-26 NOTE — Progress Notes (Signed)
Pt is being discharged home. Pt has been provided with discharge instructions and RN went over instructions with the pt and answered all questions the pt had

## 2013-10-27 LAB — CULTURE, BLOOD (SINGLE): CULTURE: NO GROWTH

## 2013-10-27 LAB — CULTURE, BLOOD (ROUTINE X 2)
Culture: NO GROWTH
Culture: NO GROWTH

## 2013-10-27 MED ORDER — UNABLE TO FIND: Status: DC

## 2013-10-27 MED ORDER — "INSULIN SYRINGE-NEEDLE U-100 29G X 1/2"" 0.3 ML MISC": Status: DC

## 2013-10-28 LAB — CRYOGLOBULIN

## 2013-11-20 ENCOUNTER — Other Ambulatory Visit (HOSPITAL_COMMUNITY): Payer: Self-pay | Admitting: Internal Medicine

## 2013-11-22 ENCOUNTER — Other Ambulatory Visit (HOSPITAL_COMMUNITY): Payer: Self-pay | Admitting: Internal Medicine

## 2013-11-28 ENCOUNTER — Other Ambulatory Visit (HOSPITAL_COMMUNITY): Payer: Self-pay | Admitting: Internal Medicine

## 2014-05-14 ENCOUNTER — Encounter (HOSPITAL_COMMUNITY): Payer: Self-pay | Admitting: *Deleted

## 2014-06-06 ENCOUNTER — Emergency Department (HOSPITAL_COMMUNITY)
Admission: EM | Admit: 2014-06-06 | Discharge: 2014-06-06 | Disposition: A | Discharge status: Home / Self Care | Payer: Medicaid Other | Attending: Emergency Medicine | Admitting: Emergency Medicine

## 2014-06-06 ENCOUNTER — Encounter (HOSPITAL_COMMUNITY): Payer: Self-pay | Admitting: Emergency Medicine

## 2014-06-06 DIAGNOSIS — F419 Anxiety disorder, unspecified: Secondary | ICD-10-CM | POA: Insufficient documentation

## 2014-06-06 DIAGNOSIS — Z9049 Acquired absence of other specified parts of digestive tract: Secondary | ICD-10-CM | POA: Insufficient documentation

## 2014-06-06 DIAGNOSIS — Z794 Long term (current) use of insulin: Secondary | ICD-10-CM | POA: Insufficient documentation

## 2014-06-06 DIAGNOSIS — E781 Pure hyperglyceridemia: Secondary | ICD-10-CM | POA: Insufficient documentation

## 2014-06-06 DIAGNOSIS — Z79899 Other long term (current) drug therapy: Secondary | ICD-10-CM | POA: Insufficient documentation

## 2014-06-06 DIAGNOSIS — E119 Type 2 diabetes mellitus without complications: Secondary | ICD-10-CM | POA: Insufficient documentation

## 2014-06-06 DIAGNOSIS — Z3202 Encounter for pregnancy test, result negative: Secondary | ICD-10-CM | POA: Insufficient documentation

## 2014-06-06 DIAGNOSIS — J449 Chronic obstructive pulmonary disease, unspecified: Secondary | ICD-10-CM | POA: Diagnosis not present

## 2014-06-06 DIAGNOSIS — F319 Bipolar disorder, unspecified: Secondary | ICD-10-CM | POA: Diagnosis not present

## 2014-06-06 DIAGNOSIS — Z7951 Long term (current) use of inhaled steroids: Secondary | ICD-10-CM | POA: Diagnosis not present

## 2014-06-06 DIAGNOSIS — R197 Diarrhea, unspecified: Secondary | ICD-10-CM | POA: Diagnosis present

## 2014-06-06 DIAGNOSIS — Z720 Tobacco use: Secondary | ICD-10-CM | POA: Insufficient documentation

## 2014-06-06 DIAGNOSIS — K529 Noninfective gastroenteritis and colitis, unspecified: Secondary | ICD-10-CM | POA: Diagnosis not present

## 2014-06-06 DIAGNOSIS — Z880 Allergy status to penicillin: Secondary | ICD-10-CM | POA: Diagnosis not present

## 2014-06-06 DIAGNOSIS — E785 Hyperlipidemia, unspecified: Secondary | ICD-10-CM | POA: Diagnosis not present

## 2014-06-06 LAB — COMPREHENSIVE METABOLIC PANEL
ALT: 21 U/L (ref 0–35)
AST: 17 U/L (ref 0–37)
Albumin: 4.9 g/dL (ref 3.5–5.2)
Alkaline Phosphatase: 133 U/L — ABNORMAL HIGH (ref 39–117)
Anion gap: 20 — ABNORMAL HIGH (ref 5–15)
BILIRUBIN TOTAL: 0.4 mg/dL (ref 0.3–1.2)
BUN: 7 mg/dL (ref 6–23)
CO2: 19 meq/L (ref 19–32)
CREATININE: 0.7 mg/dL (ref 0.50–1.10)
Calcium: 10.3 mg/dL (ref 8.4–10.5)
Chloride: 96 mEq/L (ref 96–112)
GFR calc Af Amer: >90 mL/min (ref >90)
GLUCOSE: 145 mg/dL — AB (ref 70–99)
Potassium: 3.9 mEq/L (ref 3.7–5.3)
Sodium: 135 mEq/L — ABNORMAL LOW (ref 137–147)
Total Protein: 8.5 g/dL — ABNORMAL HIGH (ref 6.0–8.3)

## 2014-06-06 LAB — CBC WITH DIFFERENTIAL/PLATELET
Basophils Absolute: 0 10*3/uL (ref 0.0–0.1)
Basophils Relative: 0 % (ref 0–1)
Eosinophils Absolute: 0.1 10*3/uL (ref 0.0–0.7)
Eosinophils Relative: 1 % (ref 0–5)
HEMATOCRIT: 41.9 % (ref 36.0–46.0)
HEMOGLOBIN: 14 g/dL (ref 12.0–15.0)
LYMPHS ABS: 1.8 10*3/uL (ref 0.7–4.0)
LYMPHS PCT: 28 % (ref 12–46)
MCH: 29.5 pg (ref 26.0–34.0)
MCHC: 33.4 g/dL (ref 30.0–36.0)
MCV: 88.2 fL (ref 78.0–100.0)
MONO ABS: 0.6 10*3/uL (ref 0.1–1.0)
Monocytes Relative: 10 % (ref 3–12)
Neutro Abs: 3.8 10*3/uL (ref 1.7–7.7)
Neutrophils Relative %: 61 % (ref 43–77)
Platelets: 207 10*3/uL (ref 150–400)
RBC: 4.75 MIL/uL (ref 3.87–5.11)
RDW: 15.1 % (ref 11.5–15.5)
WBC: 6.3 10*3/uL (ref 4.0–10.5)

## 2014-06-06 LAB — POC URINE PREG, ED: Preg Test, Ur: NEGATIVE

## 2014-06-06 LAB — URINE MICROSCOPIC-ADD ON

## 2014-06-06 LAB — LIPASE, BLOOD: LIPASE: 16 U/L (ref 11–59)

## 2014-06-06 LAB — URINALYSIS, ROUTINE W REFLEX MICROSCOPIC
BILIRUBIN URINE: NEGATIVE
Glucose, UA: NEGATIVE mg/dL
Hgb urine dipstick: NEGATIVE
KETONES UR: 40 mg/dL — AB
Leukocytes, UA: NEGATIVE
NITRITE: NEGATIVE
PROTEIN: 30 mg/dL — AB
Specific Gravity, Urine: 1.011 (ref 1.005–1.030)
Urobilinogen, UA: 0.2 mg/dL (ref 0.0–1.0)
pH: 7 (ref 5.0–8.0)

## 2014-06-06 MED ORDER — ONDANSETRON HCL 4 MG/2ML IJ SOLN
4.0000 mg | Freq: Once | INTRAMUSCULAR | Status: AC
  Administered 2014-06-06: 4 mg via INTRAVENOUS
  Filled 2014-06-06: qty 2

## 2014-06-06 MED ORDER — LOPERAMIDE HCL 2 MG PO CAPS
2.0000 mg | ORAL_CAPSULE | ORAL | Status: DC | PRN
  Administered 2014-06-06: 2 mg via ORAL
  Filled 2014-06-06: qty 1

## 2014-06-06 MED ORDER — SODIUM CHLORIDE 0.9 % IV SOLN
INTRAVENOUS | Status: DC
  Administered 2014-06-06: 11:00:00 via INTRAVENOUS

## 2014-06-06 MED ORDER — ONDANSETRON 8 MG PO TBDP: 8.0000 mg | ORAL_TABLET | Freq: Three times a day (TID) | ORAL | Status: DC | PRN

## 2014-06-06 MED ORDER — SODIUM CHLORIDE 0.9 % IV BOLUS (SEPSIS)
1000.0000 mL | Freq: Once | INTRAVENOUS | Status: AC
  Administered 2014-06-06: 1000 mL via INTRAVENOUS

## 2014-06-06 MED ORDER — DIPHENOXYLATE-ATROPINE 2.5-0.025 MG PO TABS: 2.0000 | ORAL_TABLET | Freq: Four times a day (QID) | ORAL | Status: DC | PRN

## 2014-06-06 NOTE — ED Notes (Signed)
Patient unable to void at this time, she is aware we need a UA.

## 2014-06-06 NOTE — ED Provider Notes (Signed)
CSN: [id number]     Arrival date & time 06/06/14  1033 History   First MD Initiated Contact with Patient 06/06/14 1037     No chief complaint on file.    (Consider location/radiation/quality/duration/timing/severity/associated sxs/prior Treatment) HPI Comments: Patient here complaining of 24 hours of crampy abdominal pain with associated watery diarrhea and bilious vomiting. No fever or chills. Abdominal discomfort is diffuse. Denies dysuria or hematuria. Symptoms persistent and nothing makes them better or worse. No treatment use prior to arrival. History of similar symptoms associated pregnancy however she states that she does have an IUD in. Denies any recent sick exposures or exposure to possible contaminated food  The history is provided by the patient.    Past Medical History  Diagnosis Date  . Gallstones   . Depression   . Anxiety   . COPD (chronic obstructive pulmonary disease)   . Bipolar 1 disorder   . Other and unspecified hyperlipidemia 10/18/2013  . Hypertriglyceridemia 10/18/2013  . Diabetes mellitus without complication    Past Surgical History  Procedure Laterality Date  . Cholecystectomy  11/07/2011  . Cholecystectomy  11/09/2011    Procedure: LAPAROSCOPIC CHOLECYSTECTOMY;  Surgeon: Lodema PilotBrian Layton, DO;  Location: [facility] OR;  Service: General;  Laterality: N/A;  Lap. Chole. with IOC   No family history on file. History  Substance Use Topics  . Smoking status: Current Every Day Smoker -- 0.50 packs/day    Types: Cigarettes  . Smokeless tobacco: Never Used  . Alcohol Use: Yes   OB History    Gravida Para Term Preterm AB TAB SAB Ectopic Multiple Living   5 5 5       5      Review of Systems  All other systems reviewed and are negative.     Allergies  Abilify; Amoxicillin; Lithium; and Wellbutrin  Home Medications   Prior to Admission medications   Medication Sig Start Date End Date Taking? Authorizing Provider  acetaminophen (TYLENOL) 325 MG tablet Take 2  tablets (650 mg total) by mouth every 4 (four) hours as needed for mild pain, moderate pain, fever or headache. 10/26/13   Elease EtienneAnand D Hongalgi, MD  albuterol (PROVENTIL HFA;VENTOLIN HFA) 108 (90 BASE) MCG/ACT inhaler Inhale 2 puffs into the lungs every 6 (six) hours as needed for wheezing or shortness of breath. 10/26/13   Elease EtienneAnand D Hongalgi, MD  atorvastatin (LIPITOR) 40 MG tablet Take 1 tablet (40 mg total) by mouth daily at 6 PM. 10/26/13   Elease EtienneAnand D Hongalgi, MD  clonazePAM (KLONOPIN) 0.5 MG tablet Take 0.5 mg by mouth 2 (two) times daily as needed for anxiety.    Historical Provider, MD  fenofibrate 160 MG tablet Take 1 tablet (160 mg total) by mouth daily. 10/26/13   Elease EtienneAnand D Hongalgi, MD  insulin aspart (NOVOLOG) 100 UNIT/ML injection Inject 0-15 Units into the skin 3 (three) times daily with meals. CBG < 70: eat or drink something sweet and recheck, CBG 70 - 120: 0 units CBG 121 - 150: 2 units CBG 151 - 200: 3 units CBG 201 - 250: 5 units CBG 251 - 300: 8 units CBG 301 - 350: 11 units CBG 351 - 400: 15 units CBG > 400: call MD 10/26/13   Elease EtienneAnand D Hongalgi, MD  insulin glargine (LANTUS) 100 UNIT/ML injection Inject 0.15 mLs (15 Units total) into the skin at bedtime. 10/26/13   Elease EtienneAnand D Hongalgi, MD  Insulin Syringe-Needle U-100 (SAFETY-GLIDE 0.3CC SYR 29GX1/2) 29G X 1/2" 0.3 ML MISC Use as directed.  10/27/13   Elease EtienneAnand D Hongalgi, MD  mometasone-formoterol (DULERA) 100-5 MCG/ACT AERO Inhale 2 puffs into the lungs 2 (two) times daily. 10/26/13   Elease EtienneAnand D Hongalgi, MD  nicotine (NICODERM CQ - DOSED IN MG/24 HOURS) 21 mg/24hr patch Place 1 patch (21 mg total) onto the skin daily. 10/26/13   Elease EtienneAnand D Hongalgi, MD  nitrofurantoin, macrocrystal-monohydrate, (MACROBID) 100 MG capsule Take 1 capsule (100 mg total) by mouth every 12 (twelve) hours. 10/26/13   Elease EtienneAnand D Hongalgi, MD  pantoprazole (PROTONIX) 40 MG tablet Take 1 tablet (40 mg total) by mouth daily. 10/26/13   Elease EtienneAnand D Hongalgi, MD  QUEtiapine (SEROQUEL) 400 MG  tablet Take 400 mg by mouth at bedtime.    Historical Provider, MD  tiotropium (SPIRIVA) 18 MCG inhalation capsule Place 1 capsule (18 mcg total) into inhaler and inhale daily. 10/26/13   Elease EtienneAnand D Hongalgi, MD  UNABLE TO FIND 1. Glucometer: One device 2. Glucometer strips: 1 box 3. Lancet: 1 box. 10/27/13   Elease EtienneAnand D Hongalgi, MD   There were no vitals taken for this visit. Physical Exam  Constitutional: She is oriented to person, place, and time. She appears well-developed and well-nourished.  Non-toxic appearance. No distress.  HENT:  Head: Normocephalic and atraumatic.  Eyes: Conjunctivae, EOM and lids are normal. Pupils are equal, round, and reactive to light.  Neck: Normal range of motion. Neck supple. No tracheal deviation present. No thyroid mass present.  Cardiovascular: Normal rate, regular rhythm and normal heart sounds.  Exam reveals no gallop.   No murmur heard. Pulmonary/Chest: Effort normal and breath sounds normal. No stridor. No respiratory distress. She has no decreased breath sounds. She has no wheezes. She has no rhonchi. She has no rales.  Abdominal: Soft. Normal appearance and bowel sounds are normal. She exhibits no distension. There is no tenderness. There is no rebound and no CVA tenderness.  Musculoskeletal: Normal range of motion. She exhibits no edema or tenderness.  Neurological: She is alert and oriented to person, place, and time. She has normal strength. No cranial nerve deficit or sensory deficit. GCS eye subscore is 4. GCS verbal subscore is 5. GCS motor subscore is 6.  Skin: Skin is warm and dry. No abrasion and no rash noted.  Psychiatric: She has a normal mood and affect. Her speech is normal and behavior is normal.  Nursing note and vitals reviewed.   ED Course  Procedures (including critical care time) Labs Review Labs Reviewed  URINE CULTURE  CBC WITH DIFFERENTIAL  COMPREHENSIVE METABOLIC PANEL  LIPASE, BLOOD  URINALYSIS, ROUTINE W REFLEX MICROSCOPIC   POC URINE PREG, ED    Imaging Review No results found.   EKG Interpretation None      MDM   Final diagnoses:  None    Patient given IV fluids along with anti-medics and medications for diarrhea and feels better. Suspect viral process. No evidence of abdominal pain. Stable for discharge    Toy BakerAnthony T Nekeya Briski, MD 06/06/14 1348

## 2014-06-06 NOTE — ED Notes (Signed)
Vomiting and Diarrhea starting last night about 9pm.

## 2014-06-06 NOTE — ED Notes (Signed)
Given [place] for Fluid Challenge

## 2014-06-06 NOTE — Discharge Instructions (Signed)

## 2014-06-08 LAB — URINE CULTURE: Colony Count: 60000

## 2014-12-06 ENCOUNTER — Emergency Department (HOSPITAL_COMMUNITY)
Admission: EM | Admit: 2014-12-06 | Discharge: 2014-12-06 | Disposition: A | Discharge status: Home / Self Care | Payer: Self-pay

## 2015-01-03 ENCOUNTER — Ambulatory Visit: Payer: Self-pay

## 2015-01-03 ENCOUNTER — Encounter: Payer: Medicaid Other | Admitting: Podiatry

## 2015-01-03 DIAGNOSIS — M79673 Pain in unspecified foot: Secondary | ICD-10-CM

## 2015-01-08 NOTE — Progress Notes (Signed)
This encounter was created in error - please disregard.

## 2015-03-07 IMAGING — CR DG CHEST 1V PORT
1 series · 1 of 1 positions shown · non-contrast
Comparison: 10/17/2013

CLINICAL DATA: Fever.

EXAM:
PORTABLE CHEST - 1 VIEW

[AP]
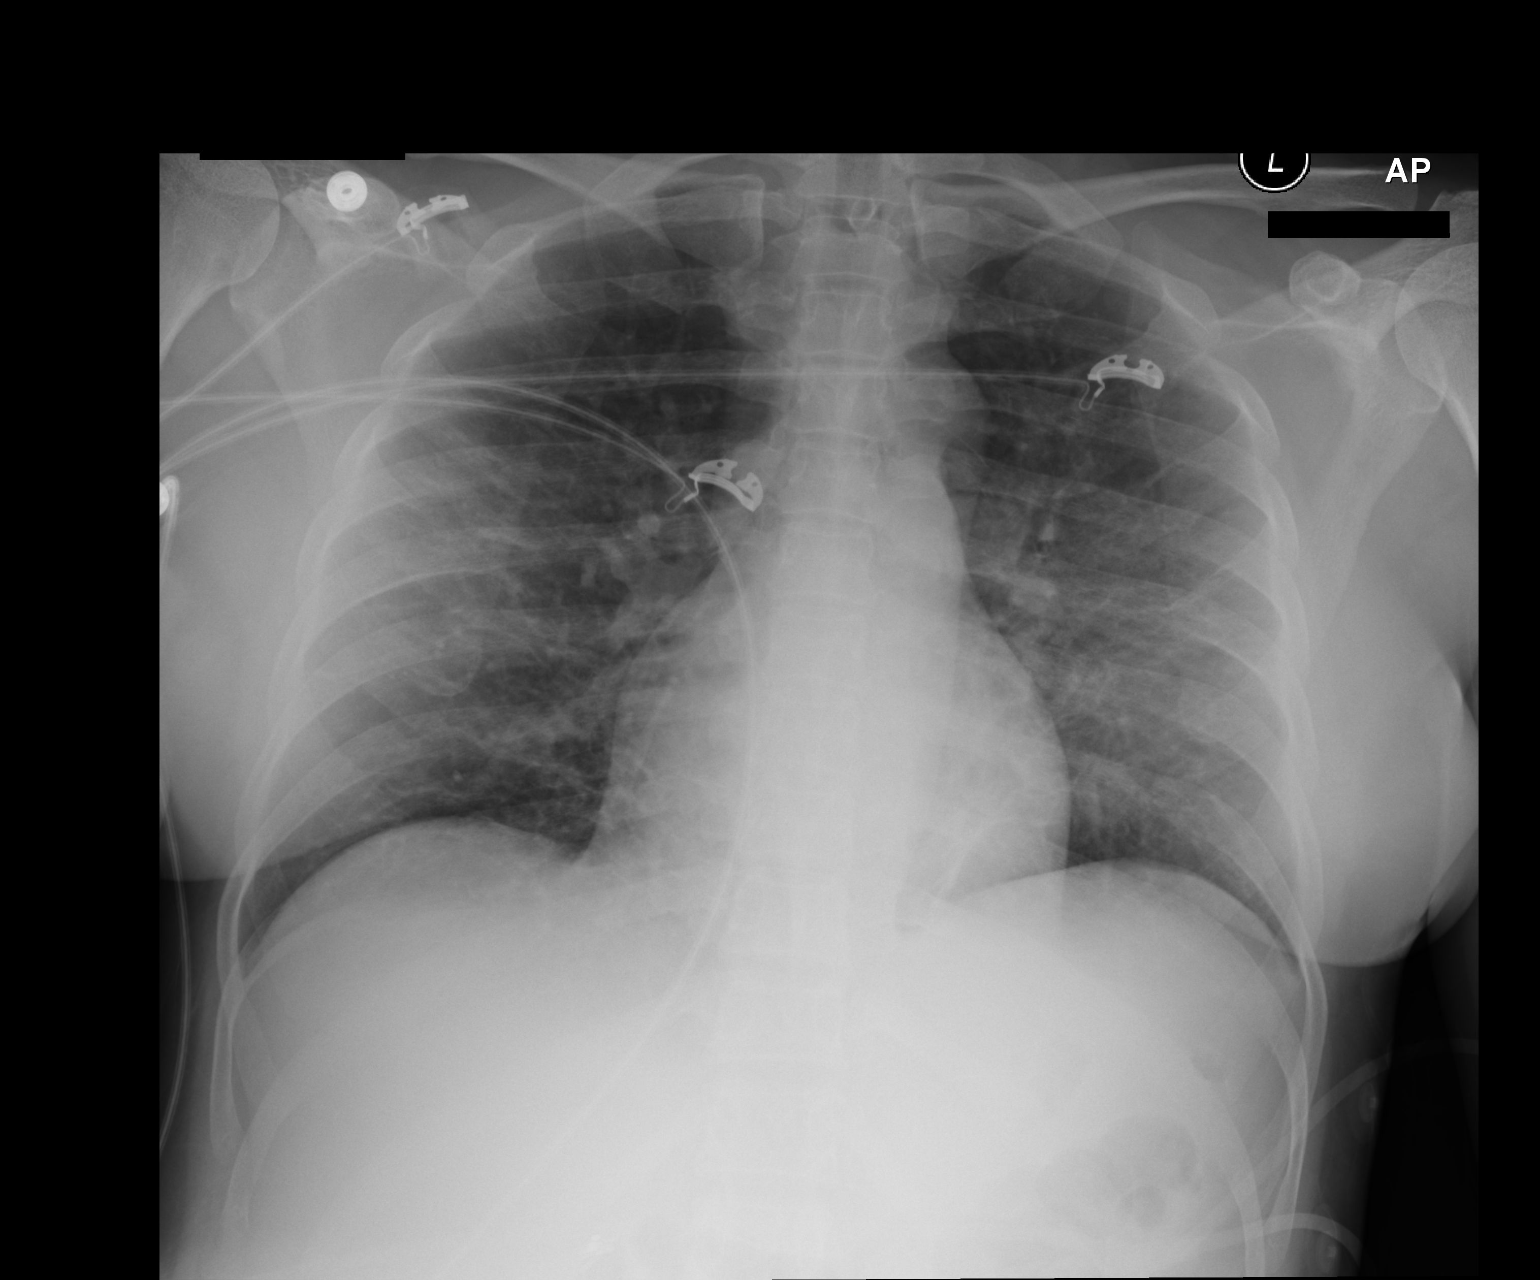

[1 of 1 positions shown; findings below may reference images not displayed]

FINDINGS: Heart, mediastinum hila unremarkable.

There is minor mid lung zone reticular type opacity that is most
likely atelectasis accentuated by relatively low lung volumes and
the semi-erect positioning. There is no convincing infiltrate. No
pulmonary edema. No pleural effusion or pneumothorax.
IMPRESSION: No acute cardiopulmonary disease.

## 2015-03-07 IMAGING — CT CT ABD-PELV W/ CM
2 of 5 series · 15 of 46 positions shown, 17 images · IV contrast (omnipaque)
Comparison: DG ABD 1 VIEW dated 10/18/2013; US ABDOMEN COMPLETE dated
11/08/2011

CLINICAL DATA: New onset diabetes possibly secondary to acute
pancreatitis, with fever and diffuse abdominal pain, evaluate for
pancreatitis

EXAM:
CT ABDOMEN AND PELVIS WITH CONTRAST
TECHNIQUE: Multidetector CT imaging of the abdomen and pelvis was performed
using the standard protocol following bolus administration of
intravenous contrast.
CONTRAST:  100mL OMNIPAQUE IOHEXOL 300 MG/ML  SOLN

[Series 2: abd/ pelvis 5.0 i30f 1 · axial · 0.61mm/px · z∈[-211,+199]mm · 12 of 94 slices shown, 14 images]
[im 6/94  soft-tissue]
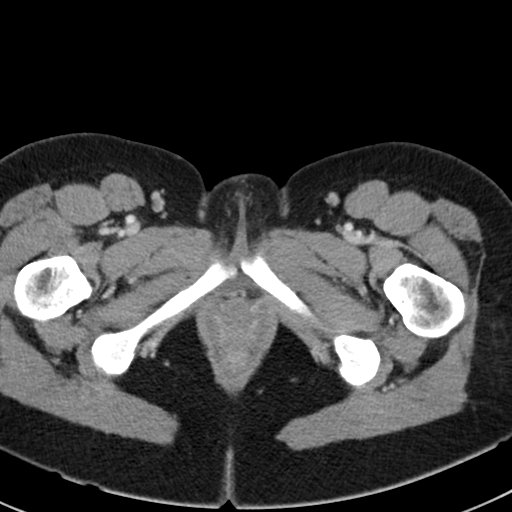
[im 6/94  bone]
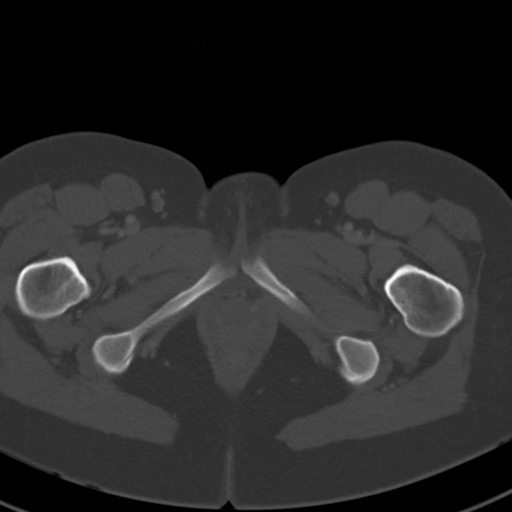
[im 16/94  soft-tissue]
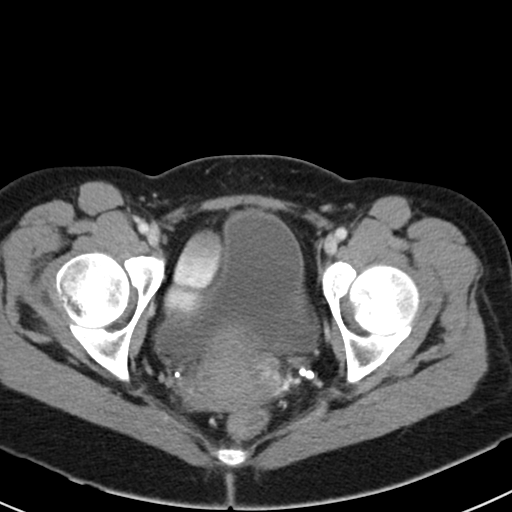
[im 21/94  soft-tissue]
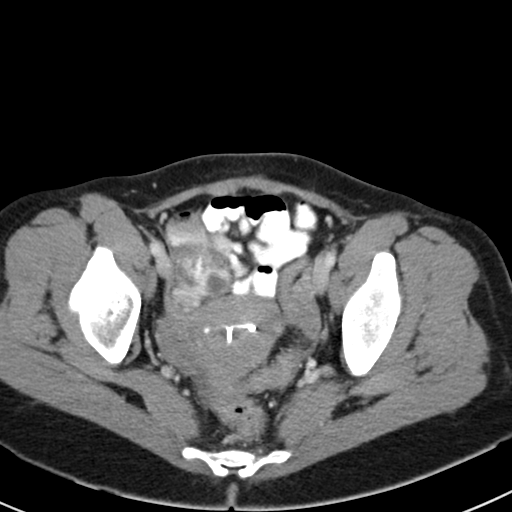
[im 26/94  soft-tissue]
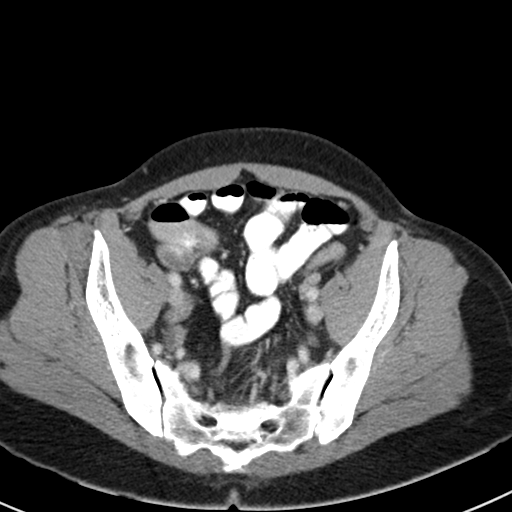
[im 37/94  soft-tissue]
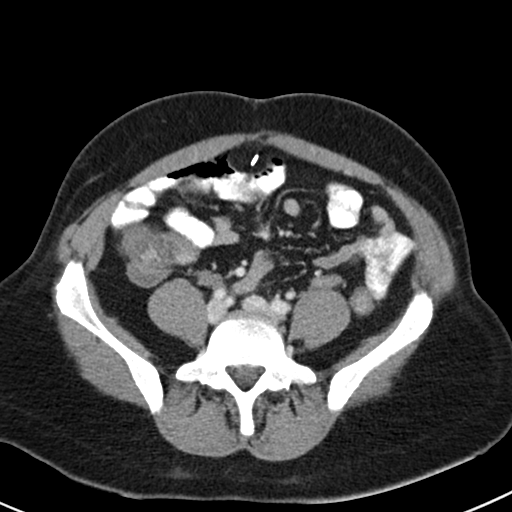
[im 42/94  soft-tissue]
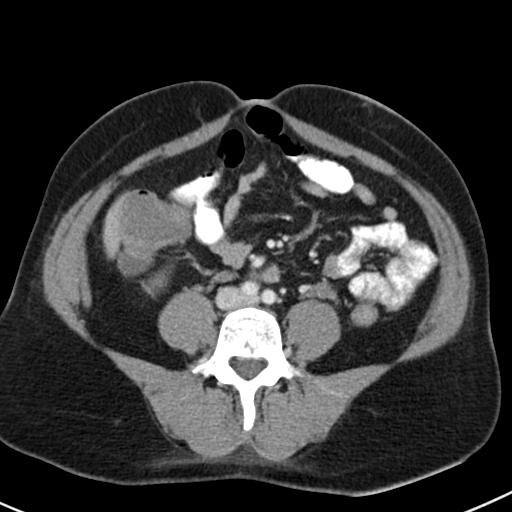
[im 52/94  soft-tissue]
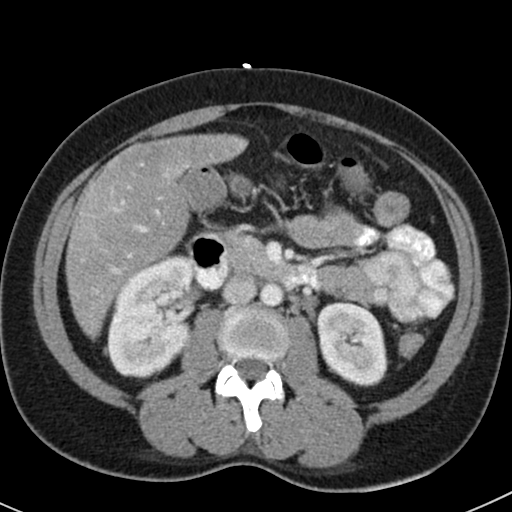
[im 57/94  soft-tissue]
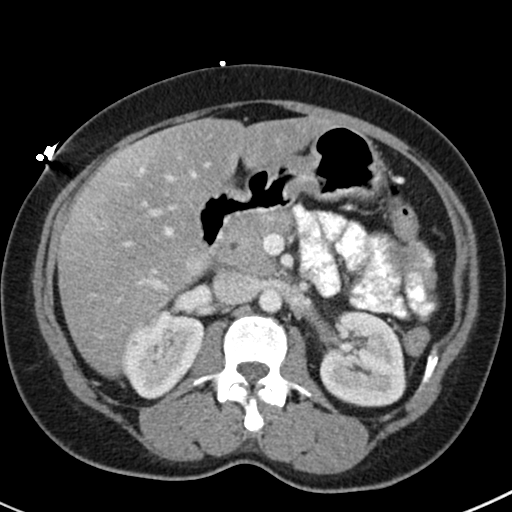
[im 68/94  soft-tissue]
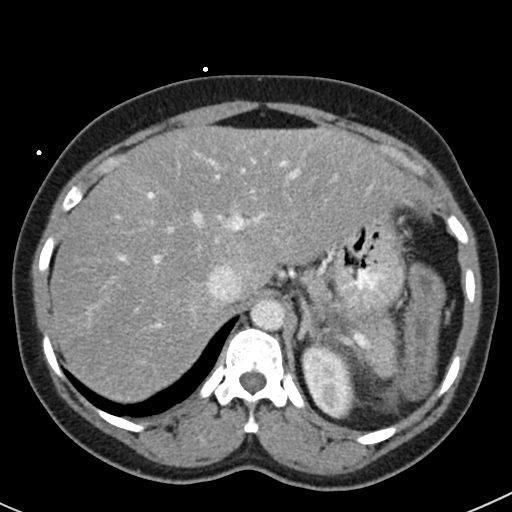
[im 68/94  bone]
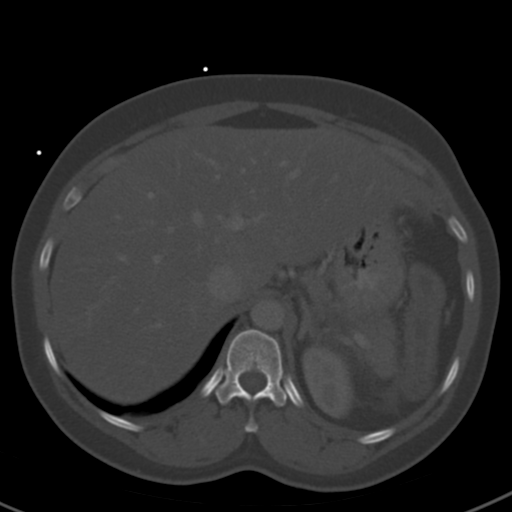
[im 73/94  soft-tissue]
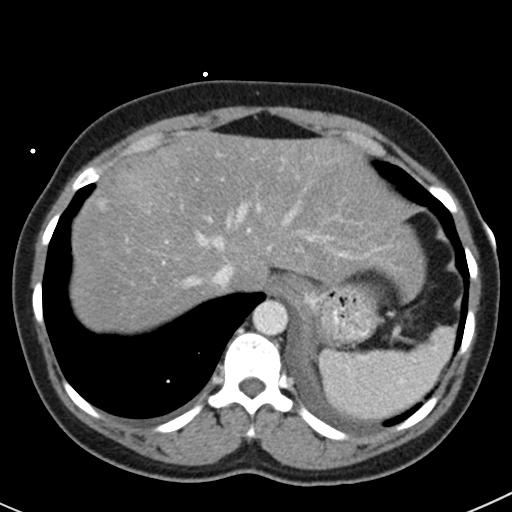
[im 78/94  soft-tissue]
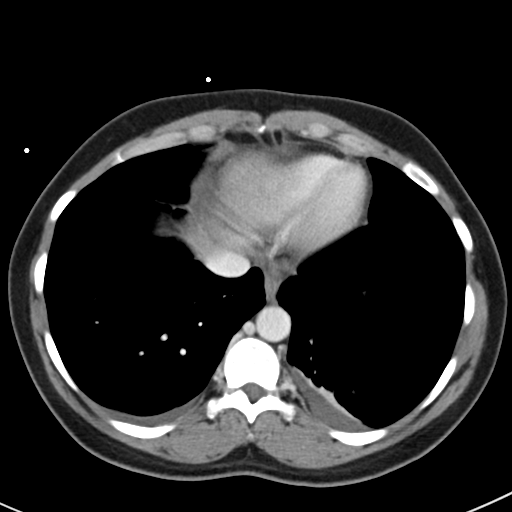
[im 88/94  soft-tissue]
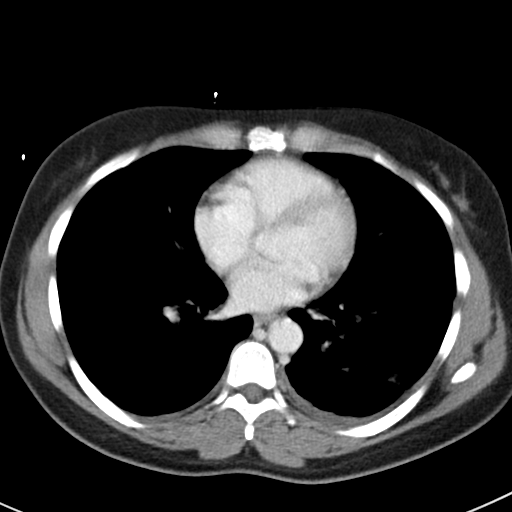

[Series 5: coronals · coronal · 0.62mm/px · 3 of 118 slices shown]
[im 40/118  soft-tissue]
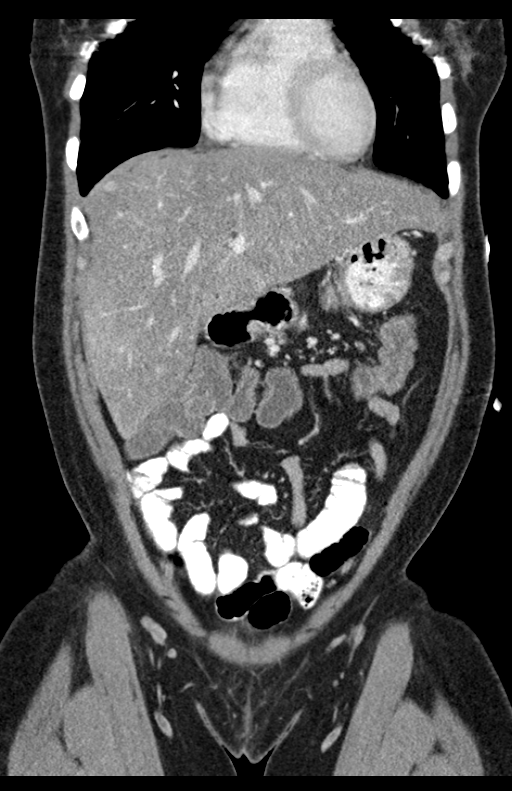
[im 53/118  soft-tissue]
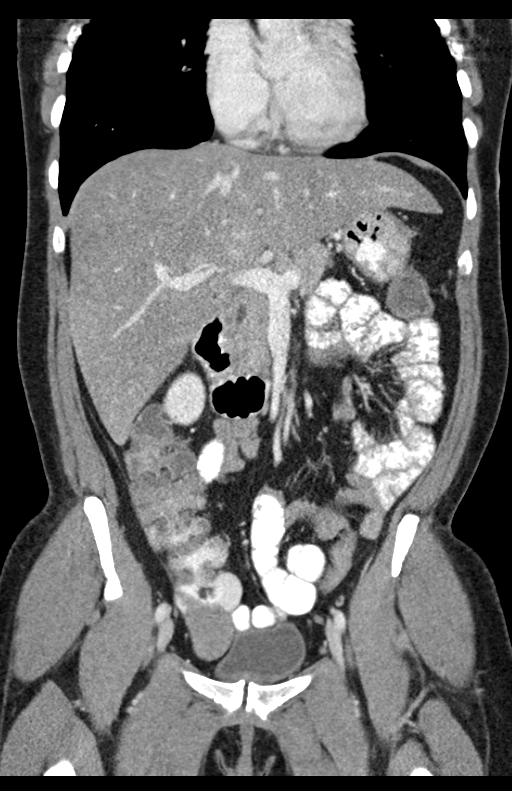
[im 66/118  soft-tissue]
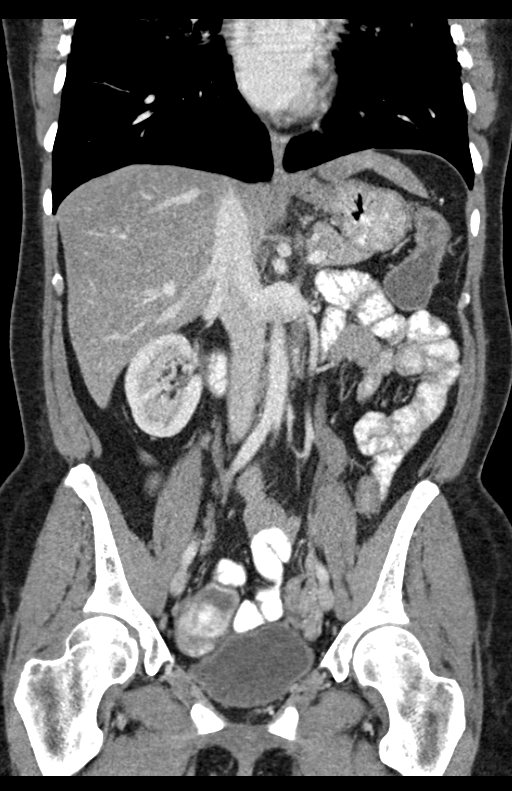

[15 of 46 positions shown; findings below may reference images not displayed]

FINDINGS: Small bilateral pleural effusions. Hazy midlung densities, likely
exaggerated by motion artifact.

Significant diffuse fatty infiltration of the liver. Gallbladder is
surgically absent. Spleen is normal. There is mild inflammatory
change adjacent to the tail of the pancreas. A very small volume of
fluid extends posterior to the gastric fundus. Pancreas is otherwise
normal. Adrenal glands are normal. Left kidney is normal. Right
kidney shows a 2 mm upper pole and a 3 mm lower pole nonobstructing
stone.

Abdominal aorta appears normal.  Bowel is normal.

Bladder is normal. There is an intrauterine device noted. Ovaries
are normal. There is trace free fluid in the cul-de-sac of the
pelvis.

There are no acute musculoskeletal findings. There is oral contrast
into the cecum.
IMPRESSION: Findings suggest possibility of mild pulmonary edema

Significant fatty infiltration of the liver

Mild inflammatory change in the region of the tail of the pancreas
which suggests the possibility of mild acute pancreatitis.

## 2015-10-02 ENCOUNTER — Emergency Department (HOSPITAL_COMMUNITY)
Admission: EM | Admit: 2015-10-02 | Discharge: 2015-10-04 | Disposition: A | Discharge status: Other Acute Inpatient Hospital | Payer: Federal, State, Local not specified - Other

## 2015-10-02 ENCOUNTER — Encounter (HOSPITAL_COMMUNITY): Payer: Self-pay | Admitting: Emergency Medicine

## 2015-10-02 DIAGNOSIS — Z880 Allergy status to penicillin: Secondary | ICD-10-CM | POA: Insufficient documentation

## 2015-10-02 DIAGNOSIS — Z794 Long term (current) use of insulin: Secondary | ICD-10-CM | POA: Insufficient documentation

## 2015-10-02 DIAGNOSIS — F1310 Sedative, hypnotic or anxiolytic abuse, uncomplicated: Secondary | ICD-10-CM | POA: Insufficient documentation

## 2015-10-02 DIAGNOSIS — Z3202 Encounter for pregnancy test, result negative: Secondary | ICD-10-CM | POA: Insufficient documentation

## 2015-10-02 DIAGNOSIS — F1210 Cannabis abuse, uncomplicated: Secondary | ICD-10-CM | POA: Insufficient documentation

## 2015-10-02 DIAGNOSIS — R45850 Homicidal ideations: Secondary | ICD-10-CM | POA: Insufficient documentation

## 2015-10-02 DIAGNOSIS — F17210 Nicotine dependence, cigarettes, uncomplicated: Secondary | ICD-10-CM | POA: Insufficient documentation

## 2015-10-02 DIAGNOSIS — F32A Depression, unspecified: Secondary | ICD-10-CM

## 2015-10-02 DIAGNOSIS — F329 Major depressive disorder, single episode, unspecified: Secondary | ICD-10-CM

## 2015-10-02 DIAGNOSIS — F3130 Bipolar disorder, current episode depressed, mild or moderate severity, unspecified: Secondary | ICD-10-CM | POA: Diagnosis present

## 2015-10-02 DIAGNOSIS — F419 Anxiety disorder, unspecified: Secondary | ICD-10-CM | POA: Insufficient documentation

## 2015-10-02 DIAGNOSIS — F314 Bipolar disorder, current episode depressed, severe, without psychotic features: Secondary | ICD-10-CM | POA: Insufficient documentation

## 2015-10-02 DIAGNOSIS — Z8719 Personal history of other diseases of the digestive system: Secondary | ICD-10-CM | POA: Insufficient documentation

## 2015-10-02 DIAGNOSIS — Z8669 Personal history of other diseases of the nervous system and sense organs: Secondary | ICD-10-CM | POA: Insufficient documentation

## 2015-10-02 DIAGNOSIS — J449 Chronic obstructive pulmonary disease, unspecified: Secondary | ICD-10-CM | POA: Insufficient documentation

## 2015-10-02 DIAGNOSIS — E119 Type 2 diabetes mellitus without complications: Secondary | ICD-10-CM | POA: Insufficient documentation

## 2015-10-02 DIAGNOSIS — R45851 Suicidal ideations: Secondary | ICD-10-CM

## 2015-10-02 DIAGNOSIS — Z79899 Other long term (current) drug therapy: Secondary | ICD-10-CM | POA: Insufficient documentation

## 2015-10-02 HISTORY — DX: Polyneuropathy, unspecified: G62.9

## 2015-10-02 LAB — COMPREHENSIVE METABOLIC PANEL
ALBUMIN: 4.2 g/dL (ref 3.5–5.0)
ALT: 18 U/L (ref 14–54)
AST: 15 U/L (ref 15–41)
Alkaline Phosphatase: 105 U/L (ref 38–126)
Anion gap: 9 (ref 5–15)
BUN: 8 mg/dL (ref 6–20)
CO2: 24 mmol/L (ref 22–32)
CREATININE: 0.81 mg/dL (ref 0.44–1.00)
Calcium: 9.2 mg/dL (ref 8.9–10.3)
Chloride: 108 mmol/L (ref 101–111)
GFR calc Af Amer: >60 mL/min (ref >60)
GFR calc non Af Amer: >60 mL/min (ref >60)
GLUCOSE: 108 mg/dL — AB (ref 65–99)
Potassium: 3.4 mmol/L — ABNORMAL LOW (ref 3.5–5.1)
Sodium: 141 mmol/L (ref 135–145)
TOTAL PROTEIN: 7.2 g/dL (ref 6.5–8.1)
Total Bilirubin: 0.2 mg/dL — ABNORMAL LOW (ref 0.3–1.2)

## 2015-10-02 LAB — I-STAT BETA HCG BLOOD, ED (MC, WL, AP ONLY)

## 2015-10-02 LAB — CBC
HEMATOCRIT: 38.5 % (ref 36.0–46.0)
Hemoglobin: 13 g/dL (ref 12.0–15.0)
MCH: 30.1 pg (ref 26.0–34.0)
MCHC: 33.8 g/dL (ref 30.0–36.0)
MCV: 89.1 fL (ref 78.0–100.0)
Platelets: 223 10*3/uL (ref 150–400)
RBC: 4.32 MIL/uL (ref 3.87–5.11)
RDW: 15.3 % (ref 11.5–15.5)
WBC: 7.6 10*3/uL (ref 4.0–10.5)

## 2015-10-02 LAB — RAPID URINE DRUG SCREEN, HOSP PERFORMED
AMPHETAMINES: NOT DETECTED
BARBITURATES: NOT DETECTED
Benzodiazepines: POSITIVE — AB
Cocaine: NOT DETECTED
Opiates: NOT DETECTED
TETRAHYDROCANNABINOL: POSITIVE — AB

## 2015-10-02 LAB — SALICYLATE LEVEL: Salicylate Lvl: <4 mg/dL (ref 2.8–30.0)

## 2015-10-02 LAB — ACETAMINOPHEN LEVEL: Acetaminophen (Tylenol), Serum: <10 ug/mL — ABNORMAL LOW (ref 10–30)

## 2015-10-02 LAB — ETHANOL: Alcohol, Ethyl (B): <5 mg/dL (ref <5)

## 2015-10-02 MED ORDER — ACETAMINOPHEN 325 MG PO TABS: 650.0000 mg | ORAL_TABLET | ORAL | Status: DC | PRN

## 2015-10-02 MED ORDER — NICOTINE 21 MG/24HR TD PT24
21.0000 mg | MEDICATED_PATCH | Freq: Every day | TRANSDERMAL | Status: DC
  Administered 2015-10-03 – 2015-10-04 (×2): 21 mg via TRANSDERMAL
  Filled 2015-10-02 (×2): qty 1

## 2015-10-02 MED ORDER — ALUM & MAG HYDROXIDE-SIMETH 200-200-20 MG/5ML PO SUSP: 30.0000 mL | ORAL | Status: DC | PRN

## 2015-10-02 MED ORDER — MOMETASONE FURO-FORMOTEROL FUM 100-5 MCG/ACT IN AERO
2.0000 | INHALATION_SPRAY | Freq: Two times a day (BID) | RESPIRATORY_TRACT | Status: DC
  Administered 2015-10-03 – 2015-10-04 (×3): 2 via RESPIRATORY_TRACT
  Filled 2015-10-02: qty 8.8

## 2015-10-02 MED ORDER — IBUPROFEN 200 MG PO TABS: 600.0000 mg | ORAL_TABLET | Freq: Three times a day (TID) | ORAL | Status: DC | PRN

## 2015-10-02 MED ORDER — LORAZEPAM 1 MG PO TABS
1.0000 mg | ORAL_TABLET | Freq: Three times a day (TID) | ORAL | Status: DC | PRN
  Administered 2015-10-02: 1 mg via ORAL
  Filled 2015-10-02: qty 1

## 2015-10-02 MED ORDER — FENOFIBRATE 160 MG PO TABS
160.0000 mg | ORAL_TABLET | Freq: Every day | ORAL | Status: DC
  Administered 2015-10-03 – 2015-10-04 (×2): 160 mg via ORAL
  Filled 2015-10-02 (×2): qty 1

## 2015-10-02 MED ORDER — INSULIN GLARGINE 100 UNIT/ML ~~LOC~~ SOLN
15.0000 [IU] | Freq: Every day | SUBCUTANEOUS | Status: DC
  Administered 2015-10-03: 15 [IU] via SUBCUTANEOUS
  Filled 2015-10-02 (×3): qty 0.15

## 2015-10-02 MED ORDER — TIOTROPIUM BROMIDE MONOHYDRATE 18 MCG IN CAPS
18.0000 ug | ORAL_CAPSULE | Freq: Every day | RESPIRATORY_TRACT | Status: DC
  Administered 2015-10-04: 18 ug via RESPIRATORY_TRACT
  Filled 2015-10-02: qty 5

## 2015-10-02 MED ORDER — PANTOPRAZOLE SODIUM 40 MG PO TBEC
40.0000 mg | DELAYED_RELEASE_TABLET | Freq: Every day | ORAL | Status: DC
  Administered 2015-10-03 – 2015-10-04 (×2): 40 mg via ORAL
  Filled 2015-10-02 (×2): qty 1

## 2015-10-02 MED ORDER — INSULIN ASPART 100 UNIT/ML ~~LOC~~ SOLN
0.0000 [IU] | Freq: Three times a day (TID) | SUBCUTANEOUS | Status: DC
Start: 2015-10-03 — End: 2015-10-03

## 2015-10-02 MED ORDER — ZOLPIDEM TARTRATE 5 MG PO TABS
5.0000 mg | ORAL_TABLET | Freq: Every evening | ORAL | Status: DC | PRN
  Administered 2015-10-02: 5 mg via ORAL
  Filled 2015-10-02: qty 1

## 2015-10-02 MED ORDER — ATORVASTATIN CALCIUM 40 MG PO TABS
40.0000 mg | ORAL_TABLET | Freq: Every day | ORAL | Status: DC
  Administered 2015-10-03 – 2015-10-04 (×2): 40 mg via ORAL
  Filled 2015-10-02 (×2): qty 1

## 2015-10-02 MED ORDER — ONDANSETRON HCL 4 MG PO TABS
4.0000 mg | ORAL_TABLET | Freq: Three times a day (TID) | ORAL | Status: DC | PRN
Start: 2015-10-02 — End: 2015-10-04

## 2015-10-02 NOTE — BH Assessment (Signed)
Assessment completed. Consulted Donell SievertSpencer Simon, PA-C who recommended inpatient treatment. TTS to seek placement. Informed Sharilyn SitesLisa Sanders, PA-C of recommendation.

## 2015-10-02 NOTE — ED Notes (Signed)
Pt has three personal belongings bags behind the triage nurses station.
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
## 2015-10-02 NOTE — ED Notes (Signed)
Pt brought in by EMS for c/o anxiety and suicidal ideations  Pt states she has been off her meds since august of last year when her medicare was taken away  Pt states she is having a hard time coping and feels like she cannot deal with things anymore  Pt states she stays in her room all day and is unable to help care for her kids right now  Pt states she is not eating or caring for herself well right now  Pt states she does not want to hurt herself but has had some suicidal ideations

## 2015-10-03 DIAGNOSIS — F329 Major depressive disorder, single episode, unspecified: Secondary | ICD-10-CM | POA: Insufficient documentation

## 2015-10-03 DIAGNOSIS — R45851 Suicidal ideations: Secondary | ICD-10-CM | POA: Insufficient documentation

## 2015-10-03 DIAGNOSIS — F3130 Bipolar disorder, current episode depressed, mild or moderate severity, unspecified: Secondary | ICD-10-CM | POA: Diagnosis present

## 2015-10-03 DIAGNOSIS — F32A Depression, unspecified: Secondary | ICD-10-CM | POA: Insufficient documentation

## 2015-10-03 LAB — CBG MONITORING, ED
GLUCOSE-CAPILLARY: 108 mg/dL — AB (ref 65–99)
GLUCOSE-CAPILLARY: 105 mg/dL — AB (ref 65–99)
GLUCOSE-CAPILLARY: 98 mg/dL (ref 65–99)
Glucose-Capillary: 117 mg/dL — ABNORMAL HIGH (ref 65–99)

## 2015-10-03 MED ORDER — INSULIN ASPART 100 UNIT/ML ~~LOC~~ SOLN
0.0000 [IU] | Freq: Three times a day (TID) | SUBCUTANEOUS | Status: DC
  Filled 2015-10-03: qty 1

## 2015-10-03 MED ORDER — MIRTAZAPINE 30 MG PO TABS
15.0000 mg | ORAL_TABLET | Freq: Every day | ORAL | Status: DC
  Administered 2015-10-03: 15 mg via ORAL
  Filled 2015-10-03: qty 1

## 2015-10-03 MED ORDER — HYDROXYZINE HCL 25 MG PO TABS
25.0000 mg | ORAL_TABLET | Freq: Three times a day (TID) | ORAL | Status: DC | PRN
Start: 2015-10-03 — End: 2015-10-04
  Administered 2015-10-03 – 2015-10-04 (×2): 25 mg via ORAL
  Filled 2015-10-03 (×2): qty 1

## 2015-10-03 MED ORDER — CARBAMAZEPINE 200 MG PO TABS
200.0000 mg | ORAL_TABLET | Freq: Two times a day (BID) | ORAL | Status: DC
  Administered 2015-10-03 – 2015-10-04 (×3): 200 mg via ORAL
  Filled 2015-10-03 (×4): qty 1

## 2015-10-03 NOTE — ED Notes (Signed)
Pt's husband into see briefly

## 2015-10-03 NOTE — Consult Note (Signed)
[facility] Face-to-Face Psychiatry Consult   Reason for Consult:  Suicidal ideation, Depression Referring Physician:  EDP Patient Identification: Stacy Pierce MRN:  [id number] Principal Diagnosis: Bipolar disorder current episode depressed (HCC) Diagnosis:   Patient Active Problem List   Diagnosis Date Noted  . Bipolar disorder current episode depressed (HCC) [F31.30] 10/03/2015    Priority: High  . SIRS (systemic inflammatory response syndrome) (HCC) [R65.10] 10/23/2013  . Enterococcus UTI [N39.0, B95.2] 10/23/2013  . Hypokalemia [E87.6] 10/20/2013  . Other and unspecified hyperlipidemia [E78.5] 10/18/2013  . Hypertriglyceridemia [E78.1] 10/18/2013  . Leukocytosis, unspecified [D72.829] 10/18/2013  . Abdominal pain, epigastric [R10.13] 10/18/2013  . Pancreatitis, acute [K85.9] 10/18/2013  . DKA (diabetic ketoacidoses) (HCC) [E13.10] 10/17/2013  . ASCUS pap with negative HPV [R87.610] 08/15/2013  . Acute cholecystitis [K81.0] 11/09/2011  . THYROMEGALY [E04.9] 04/02/2010  . FATIGUE [R53.81, R53.83] 04/02/2010  . ABDOMINAL PAIN [R10.9] 05/06/2009  . TOBACCO ABUSE [F17.200] 10/18/2008  . DEPRESSIVE DISORDER NOT ELSEWHERE CLASSIFIED [F32.9] 10/18/2008  . COUGH, CHRONIC [R05] 10/18/2008  . SPRAIN/STRAIN, SUPRASPINATUS [S46.819A] 04/12/2007    Total Time spent with patient: 45 minutes  Subjective:   Stacy Pierce is a 44 y.o. female patient admitted with suicidal ideation, Depression.  HPI:  AA female, 44 years old was evaluated for suicidal ideation.  Patient was brought in by EMS for suicidal ideation and severe anxiety.  Patient admits that she has not been taking her medications  For an unknown period of time because she lost her Medicaid.  Patient states that she lost her medicaid after she got married to a disabled man.  Patient reports poor sleep and severe intense anxiety.  Patient reports stressors from taking care of the husband but want to get back on her medications.  Patient  was tearful through out the interview.  She is not able to contract for safety.  She has been hospitalized twice in the past.  Patient has been accepted for admission and we will be seeking placement at any facility with available bed.  Past Psychiatric History:  Depression, Schizophrenia  Risk to Self: Suicidal Ideation: Yes-Currently Present Suicidal Intent: Yes-Currently Present Is patient at risk for suicide?: Yes Suicidal Plan?: Yes-Currently Present Specify Current Suicidal Plan: "I was going to take all of my husband medication".  Access to Means: Yes Specify Access to Suicidal Means: access to husband medication.  What has been your use of drugs/alcohol within the last 12 months?: Alcohol and THC use reported.  How many times?:  (multiple ) Other Self Harm Risks: Pt denies Triggers for Past Attempts: Unpredictable Intentional Self Injurious Behavior: None Risk to Others: Homicidal Ideation: No Thoughts of Harm to Others: No Current Homicidal Intent: No Current Homicidal Plan: No Access to Homicidal Means: No Identified Victim: N/A History of harm to others?: No Assessment of Violence: None Noted Violent Behavior Description: No violent behaviors observed. Pt is calm and cooperative at this time.  Does patient have access to weapons?: Yes (Comment) ("yes, I got millions of weapons at home". ) Criminal Charges Pending?: No Does patient have a court date: No Prior Inpatient Therapy: Prior Inpatient Therapy: Yes Prior Therapy Dates: 2010 Prior Therapy Facilty/Provider(s): [facility] Reason for Treatment: Depression  Prior Outpatient Therapy: Prior Outpatient Therapy: Yes Prior Therapy Dates: 2007-2016 Prior Therapy Facilty/Provider(s): [facility]  Reason for Treatment: medication management, outpatient therapy  Does patient have an ACCT team?: No Does patient have Intensive In-House Services?  : No Does patient have [facility] services? : No Does patient   have [facility]  services?: No  Past Medical History:  Past Medical History  Diagnosis Date  . Gallstones   . Depression   . Anxiety   . COPD (chronic obstructive pulmonary disease) (HCC)   . Bipolar 1 disorder (HCC)   . Other and unspecified hyperlipidemia 10/18/2013  . Hypertriglyceridemia 10/18/2013  . Diabetes mellitus without complication (HCC)   . Neuropathy (HCC)     Past Surgical History  Procedure Laterality Date  . Cholecystectomy  11/07/2011  . Cholecystectomy  11/09/2011    Procedure: LAPAROSCOPIC CHOLECYSTECTOMY;  Surgeon: Brian Layton, DO;  Location: [facility] OR;  Service: General;  Laterality: N/A;  Lap. Chole. with IOC   Family History:  Family History  Problem Relation Age of Onset  . Hypertension Other   . Diabetes Other   . Kidney failure Other   . CAD Other    Family Psychiatric  History:  Unable to obtain Social History:  History  Alcohol Use No     History  Drug Use No    Social History   Social History  . Marital Status: Single    Spouse Name: N/A  . Number of Children: N/A  . Years of Education: N/A   Social History Main Topics  . Smoking status: Current Every Day Smoker -- 0.50 packs/day    Types: Cigarettes  . Smokeless tobacco: Never Used  . Alcohol Use: No  . Drug Use: No  . Sexual Activity: Yes    Birth Control/ Protection: IUD   Other Topics Concern  . None   Social History Narrative   Additional Social History:    Allergies:   Allergies  Allergen Reactions  . Abilify [Aripiprazole] Hives and Itching  . Amoxicillin Hives, Itching and Rash    Has patient had a PCN reaction causing immediate rash, facial/tongue/throat swelling, SOB or lightheadedness with hypotension: Yes rash all over body Has patient had a PCN reaction causing severe rash involving mucus membranes or skin necrosis: Yes Has patient had a PCN reaction that required hospitalization Yes-ED visit Has patient had a PCN reaction occurring within the last 10 years: Yes If all of the  above answers are "NO", then may proceed with Cephalosporin use.   . Lithium Hives  . Wellbutrin [Bupropion] Rash    Labs:  Results for orders placed or performed during the hospital encounter of 10/02/15 (from the past 48 hour(s))  Urine rapid drug screen (hosp performed) (Not at [facility])     Status: Abnormal   Collection Time: 10/02/15  9:59 PM  Result Value Ref Range   Opiates NONE DETECTED NONE DETECTED   Cocaine NONE DETECTED NONE DETECTED   Benzodiazepines POSITIVE (A) NONE DETECTED   Amphetamines NONE DETECTED NONE DETECTED   Tetrahydrocannabinol POSITIVE (A) NONE DETECTED   Barbiturates NONE DETECTED NONE DETECTED    Comment:        DRUG SCREEN FOR MEDICAL PURPOSES ONLY.  IF CONFIRMATION IS NEEDED FOR ANY PURPOSE, NOTIFY LAB WITHIN 5 DAYS.        LOWEST DETECTABLE LIMITS FOR URINE DRUG SCREEN Drug Class       Cutoff (ng/mL) Amphetamine      1000 Barbiturate      200 Benzodiazepine   200 Tricyclics       300 Opiates          300 Cocaine          300 THC              50     Comprehensive metabolic panel     Status: Abnormal   Collection Time: 10/02/15 10:06 PM  Result Value Ref Range   Sodium 141 135 - 145 mmol/L   Potassium 3.4 (L) 3.5 - 5.1 mmol/L   Chloride 108 101 - 111 mmol/L   CO2 24 22 - 32 mmol/L   Glucose, Bld 108 (H) 65 - 99 mg/dL   BUN 8 6 - 20 mg/dL   Creatinine, Ser 0.81 0.44 - 1.00 mg/dL   Calcium 9.2 8.9 - 10.3 mg/dL   Total Protein 7.2 6.5 - 8.1 g/dL   Albumin 4.2 3.5 - 5.0 g/dL   AST 15 15 - 41 U/L   ALT 18 14 - 54 U/L   Alkaline Phosphatase 105 38 - 126 U/L   Total Bilirubin 0.2 (L) 0.3 - 1.2 mg/dL   GFR calc non Af Amer >60 >60 mL/min   GFR calc Af Amer >60 >60 mL/min    Comment: (NOTE) The eGFR has been calculated using the CKD EPI equation. This calculation has not been validated in all clinical situations. eGFR's persistently <60 mL/min signify possible Chronic Kidney Disease.    Anion gap 9 5 - 15  Ethanol (ETOH)     Status: None    Collection Time: 10/02/15 10:06 PM  Result Value Ref Range   Alcohol, Ethyl (B) <5 <5 mg/dL    Comment:        LOWEST DETECTABLE LIMIT FOR SERUM ALCOHOL IS 5 mg/dL FOR MEDICAL PURPOSES ONLY   Salicylate level     Status: None   Collection Time: 10/02/15 10:06 PM  Result Value Ref Range   Salicylate Lvl <4.0 2.8 - 30.0 mg/dL  Acetaminophen level     Status: Abnormal   Collection Time: 10/02/15 10:06 PM  Result Value Ref Range   Acetaminophen (Tylenol), Serum <10 (L) 10 - 30 ug/mL    Comment:        THERAPEUTIC CONCENTRATIONS VARY SIGNIFICANTLY. A RANGE OF 10-30 ug/mL MAY BE AN EFFECTIVE CONCENTRATION FOR MANY PATIENTS. HOWEVER, SOME ARE BEST TREATED AT CONCENTRATIONS OUTSIDE THIS RANGE. ACETAMINOPHEN CONCENTRATIONS >150 ug/mL AT 4 HOURS AFTER INGESTION AND >50 ug/mL AT 12 HOURS AFTER INGESTION ARE OFTEN ASSOCIATED WITH TOXIC REACTIONS.   CBC     Status: None   Collection Time: 10/02/15 10:06 PM  Result Value Ref Range   WBC 7.6 4.0 - 10.5 K/uL   RBC 4.32 3.87 - 5.11 MIL/uL   Hemoglobin 13.0 12.0 - 15.0 g/dL   HCT 38.5 36.0 - 46.0 %   MCV 89.1 78.0 - 100.0 fL   MCH 30.1 26.0 - 34.0 pg   MCHC 33.8 30.0 - 36.0 g/dL   RDW 15.3 11.5 - 15.5 %   Platelets 223 150 - 400 K/uL  I-Stat beta hCG blood, ED ([facility], [facility], [facility] only)     Status: None   Collection Time: 10/02/15 10:18 PM  Result Value Ref Range   I-stat hCG, quantitative <5.0 <5 mIU/mL   Comment 3            Comment:   GEST. AGE      CONC.  (mIU/mL)   <=1 WEEK        5 - 50     2 WEEKS       50 - 500     3 WEEKS       100 - 10,000     4 WEEKS     1,000 - 30,000          FEMALE AND NON-PREGNANT FEMALE:     LESS THAN 5 mIU/mL   CBG monitoring, ED     Status: Abnormal   Collection Time: 10/03/15  8:05 AM  Result Value Ref Range   Glucose-Capillary 108 (H) 65 - 99 mg/dL   Comment 1 Notify RN   CBG monitoring, ED     Status: Abnormal   Collection Time: 10/03/15 12:47 PM  Result Value Ref Range   Glucose-Capillary 105  (H) 65 - 99 mg/dL   Comment 1 Notify RN     Current Facility-Administered Medications  Medication Dose Route Frequency Provider Last Rate Last Dose  . acetaminophen (TYLENOL) tablet 650 mg  650 mg Oral Q4H PRN Lisa M Sanders, PA-C      . alum & mag hydroxide-simeth (MAALOX/MYLANTA) 200-200-20 MG/5ML suspension 30 mL  30 mL Oral PRN Lisa M Sanders, PA-C      . atorvastatin (LIPITOR) tablet 40 mg  40 mg Oral q1800 Lisa M Sanders, PA-C      . carbamazepine (TEGRETOL) tablet 200 mg  200 mg Oral BID PC  , MD      . fenofibrate tablet 160 mg  160 mg Oral Daily Lisa M Sanders, PA-C   160 mg at 10/03/15 0935  . hydrOXYzine (ATARAX/VISTARIL) tablet 25 mg  25 mg Oral TID PRN  , MD      . ibuprofen (ADVIL,MOTRIN) tablet 600 mg  600 mg Oral Q8H PRN Lisa M Sanders, PA-C      . insulin aspart (novoLOG) injection 0-15 Units  0-15 Units Subcutaneous TID WC Kevin Campos, MD   0 Units at 10/03/15 1252  . insulin glargine (LANTUS) injection 15 Units  15 Units Subcutaneous QHS Lisa M Sanders, PA-C   15 Units at 10/02/15 2359  . mirtazapine (REMERON) tablet 15 mg  15 mg Oral QHS  , MD      . mometasone-formoterol (DULERA) 100-5 MCG/ACT inhaler 2 puff  2 puff Inhalation BID  , MD   2 puff at 10/03/15 0936  . nicotine (NICODERM CQ - dosed in mg/24 hours) patch 21 mg  21 mg Transdermal Daily Lisa M Sanders, PA-C   21 mg at 10/03/15 0805  . ondansetron (ZOFRAN) tablet 4 mg  4 mg Oral Q8H PRN Lisa M Sanders, PA-C      . pantoprazole (PROTONIX) EC tablet 40 mg  40 mg Oral Daily Lisa M Sanders, PA-C   40 mg at 10/03/15 0935  . tiotropium (SPIRIVA) inhalation capsule 18 mcg  18 mcg Inhalation Daily Lisa M Sanders, PA-C   18 mcg at 10/03/15 1000   Current Outpatient Prescriptions  Medication Sig Dispense Refill  . albuterol (PROVENTIL HFA;VENTOLIN HFA) 108 (90 BASE) MCG/ACT inhaler Inhale 2 puffs into the lungs every 6 (six) hours as needed for wheezing or shortness  of breath. 1 Inhaler 0  . ALPRAZolam (XANAX) 1 MG tablet Take 1 mg by mouth 3 (three) times daily.    . ibuprofen (ADVIL,MOTRIN) 200 MG tablet Take 600 mg by mouth every 6 (six) hours as needed.    . acetaminophen (TYLENOL) 325 MG tablet Take 2 tablets (650 mg total) by mouth every 4 (four) hours as needed for mild pain, moderate pain, fever or headache. (Patient not taking: Reported on 06/06/2014)    . atorvastatin (LIPITOR) 40 MG tablet Take 1 tablet (40 mg total) by mouth daily at 6 PM. (Patient not taking: Reported on 06/06/2014) 30 tablet 0  . diphenoxylate-atropine (LOMOTIL) 2.5-0.025 MG per tablet Take   2 tablets by mouth 4 (four) times daily as needed for diarrhea or loose stools. (Patient not taking: Reported on 10/02/2015) 30 tablet 0  . fenofibrate 160 MG tablet Take 1 tablet (160 mg total) by mouth daily. (Patient not taking: Reported on 06/06/2014) 30 tablet 0  . insulin aspart (NOVOLOG) 100 UNIT/ML injection Inject 0-15 Units into the skin 3 (three) times daily with meals. CBG < 70: eat or drink something sweet and recheck, CBG 70 - 120: 0 units CBG 121 - 150: 2 units CBG 151 - 200: 3 units CBG 201 - 250: 5 units CBG 251 - 300: 8 units CBG 301 - 350: 11 units CBG 351 - 400: 15 units CBG > 400: call MD 10 mL 0  . insulin glargine (LANTUS) 100 UNIT/ML injection Inject 0.15 mLs (15 Units total) into the skin at bedtime. (Patient not taking: Reported on 10/02/2015) 10 mL 0  . Insulin Syringe-Needle U-100 (SAFETY-GLIDE 0.3CC SYR 29GX1/2) 29G X 1/2" 0.3 ML MISC Use as directed. 200 each 0  . mometasone-formoterol (DULERA) 100-5 MCG/ACT AERO Inhale 2 puffs into the lungs 2 (two) times daily. (Patient not taking: Reported on 06/06/2014) 8.8 g 0  . nicotine (NICODERM CQ - DOSED IN MG/24 HOURS) 21 mg/24hr patch Place 1 patch (21 mg total) onto the skin daily. (Patient not taking: Reported on 06/06/2014) 28 patch 0  . nitrofurantoin, macrocrystal-monohydrate, (MACROBID) 100 MG capsule Take 1  capsule (100 mg total) by mouth every 12 (twelve) hours. (Patient not taking: Reported on 06/06/2014) 4 capsule 0  . ondansetron (ZOFRAN ODT) 8 MG disintegrating tablet Take 1 tablet (8 mg total) by mouth every 8 (eight) hours as needed for nausea or vomiting. (Patient not taking: Reported on 10/02/2015) 20 tablet 0  . pantoprazole (PROTONIX) 40 MG tablet Take 1 tablet (40 mg total) by mouth daily. (Patient not taking: Reported on 06/06/2014) 30 tablet 0  . tiotropium (SPIRIVA) 18 MCG inhalation capsule Place 1 capsule (18 mcg total) into inhaler and inhale daily. (Patient not taking: Reported on 06/06/2014) 30 capsule 0  . UNABLE TO FIND 1. Glucometer: One device 2. Glucometer strips: 1 box 3. Lancet: 1 box. 1 Units 0    Musculoskeletal: Strength & Muscle Tone: within normal limits Gait & Station: normal Patient leans: N/A  Psychiatric Specialty Exam: Review of Systems  Constitutional: Negative.   HENT: Negative.   Eyes: Negative.   Respiratory: Negative.   Cardiovascular: Negative.   Gastrointestinal: Negative.   Genitourinary: Negative.   Musculoskeletal: Negative.   Skin: Negative.   Neurological: Negative.     Blood pressure 123/71, pulse 74, temperature 98.1 F (36.7 C), temperature source Oral, resp. rate 18, SpO2 100 %.There is no weight on file to calculate BMI.  General Appearance: Casual and Disheveled  Eye Contact::  Good  Speech:  Clear and Coherent and Pressured  Volume:  Normal  Mood:  Angry, Anxious and Depressed  Affect:  Congruent and Depressed  Thought Process:  Coherent, Goal Directed and Intact  Orientation:  Full (Time, Place, and Person)  Thought Content:  Hallucinations: Auditory  Suicidal Thoughts:  No  Homicidal Thoughts:  No  Memory:  Immediate;   Good Recent;   Good Remote;   Good  Judgement:  Good  Insight:  Good  Psychomotor Activity:  Psychomotor Retardation  Concentration:  Fair  Recall:  Good  Fund of Knowledge:Good  Language: Good   Akathisia:  NA  Handed:  Right  AIMS (if indicated):  Assets:  Desire for Improvement  ADL's:  Intact  Cognition: WNL  Sleep:      Treatment Plan Summary: Daily contact with patient to assess and evaluate symptoms and progress in treatment and Medication management  Disposition: Accepted for admission and we will be seeking placement at any facility with available bed.   We have resumed her home medications.  Josephine C Onuoha, NP   PMHNP-BC 10/03/2015 12:56 PM Patient seen face-to-face for psychiatric evaluation, chart reviewed and case discussed with the physician extender and developed treatment plan. Reviewed the information documented and agree with the treatment plan.  , MD 

## 2015-10-03 NOTE — ED Notes (Signed)
Patient received calm and cooperative, denies SI, HI, anxiety, and depression at this time. She reports felling so much better without all the people around her making her stress. Contracted for safety, and has no needs at this time. Will continue Q 15 min checks for safety.

## 2015-10-03 NOTE — ED Notes (Signed)
Patient arrived to unit and is tearful and anxious on assessment. Pt states she is suicidal without plan because "I lost my benefits". Pt given medications for sleep and anxiety. Pt verbally contracts for safety while in the hospital.

## 2015-10-03 NOTE — ED Notes (Signed)
Dr A and Josephine NP into see 

## 2015-10-03 NOTE — [facility] Counselor (Signed)
TTS Counselor faxed referrals to the following facilities in effort to obtain inpatient treatment:  [place]  [facility]   [facility] [place], [facility] T[facility] 10/03/15   20:20

## 2015-10-03 NOTE — Progress Notes (Signed)
Pt seen by [facility] staff, Kennyth ArnoldStacy-- referred to [facility] to get assist with [facility] card and getting [facility] medications
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
## 2015-10-03 NOTE — ED Notes (Signed)
Up to the bathroom 
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
## 2015-10-04 ENCOUNTER — Encounter (HOSPITAL_COMMUNITY): Payer: Self-pay | Admitting: *Deleted

## 2015-10-04 ENCOUNTER — Inpatient Hospital Stay (HOSPITAL_COMMUNITY)
Admission: AD | Admit: 2015-10-04 | Discharge: 2015-10-07 | DRG: 885 | Disposition: A | Discharge status: Home / Self Care | Payer: Federal, State, Local not specified - Other | Source: Intra-hospital | Attending: Psychiatry | Admitting: Psychiatry

## 2015-10-04 DIAGNOSIS — J449 Chronic obstructive pulmonary disease, unspecified: Secondary | ICD-10-CM | POA: Diagnosis present

## 2015-10-04 DIAGNOSIS — F3130 Bipolar disorder, current episode depressed, mild or moderate severity, unspecified: Principal | ICD-10-CM | POA: Diagnosis present

## 2015-10-04 DIAGNOSIS — F1210 Cannabis abuse, uncomplicated: Secondary | ICD-10-CM | POA: Diagnosis not present

## 2015-10-04 DIAGNOSIS — E119 Type 2 diabetes mellitus without complications: Secondary | ICD-10-CM | POA: Diagnosis present

## 2015-10-04 DIAGNOSIS — F17210 Nicotine dependence, cigarettes, uncomplicated: Secondary | ICD-10-CM | POA: Diagnosis present

## 2015-10-04 DIAGNOSIS — F3132 Bipolar disorder, current episode depressed, moderate: Secondary | ICD-10-CM | POA: Diagnosis not present

## 2015-10-04 DIAGNOSIS — G4700 Insomnia, unspecified: Secondary | ICD-10-CM | POA: Diagnosis present

## 2015-10-04 DIAGNOSIS — F319 Bipolar disorder, unspecified: Secondary | ICD-10-CM | POA: Diagnosis present

## 2015-10-04 DIAGNOSIS — R45851 Suicidal ideations: Secondary | ICD-10-CM | POA: Diagnosis not present

## 2015-10-04 LAB — CBG MONITORING, ED
GLUCOSE-CAPILLARY: 103 mg/dL — AB (ref 65–99)
Glucose-Capillary: 102 mg/dL — ABNORMAL HIGH (ref 65–99)
Glucose-Capillary: 114 mg/dL — ABNORMAL HIGH (ref 65–99)

## 2015-10-04 LAB — GLUCOSE, CAPILLARY: GLUCOSE-CAPILLARY: 111 mg/dL — AB (ref 65–99)

## 2015-10-04 MED ORDER — INFLUENZA VAC SPLIT QUAD 0.5 ML IM SUSY
0.5000 mL | PREFILLED_SYRINGE | INTRAMUSCULAR | Status: AC
  Administered 2015-10-06: 0.5 mL via INTRAMUSCULAR
  Filled 2015-10-04: qty 0.5

## 2015-10-04 MED ORDER — MAGNESIUM HYDROXIDE 400 MG/5ML PO SUSP: 30.0000 mL | Freq: Every day | ORAL | Status: DC | PRN

## 2015-10-04 MED ORDER — INSULIN GLARGINE 100 UNIT/ML ~~LOC~~ SOLN
15.0000 [IU] | Freq: Every day | SUBCUTANEOUS | Status: DC
  Administered 2015-10-04 – 2015-10-06 (×3): 15 [IU] via SUBCUTANEOUS

## 2015-10-04 MED ORDER — TIOTROPIUM BROMIDE MONOHYDRATE 18 MCG IN CAPS
18.0000 ug | ORAL_CAPSULE | Freq: Every day | RESPIRATORY_TRACT | Status: DC
  Administered 2015-10-05 – 2015-10-07 (×3): 18 ug via RESPIRATORY_TRACT
  Filled 2015-10-04: qty 5

## 2015-10-04 MED ORDER — ALUM & MAG HYDROXIDE-SIMETH 200-200-20 MG/5ML PO SUSP
30.0000 mL | ORAL | Status: DC | PRN
Start: 2015-10-04 — End: 2015-10-07

## 2015-10-04 MED ORDER — ATORVASTATIN CALCIUM 40 MG PO TABS
40.0000 mg | ORAL_TABLET | Freq: Every day | ORAL | Status: DC
  Administered 2015-10-05 – 2015-10-06 (×2): 40 mg via ORAL
  Filled 2015-10-04 (×3): qty 1

## 2015-10-04 MED ORDER — NICOTINE 21 MG/24HR TD PT24
21.0000 mg | MEDICATED_PATCH | Freq: Every day | TRANSDERMAL | Status: DC
  Administered 2015-10-05 – 2015-10-07 (×3): 21 mg via TRANSDERMAL
  Filled 2015-10-04 (×4): qty 1

## 2015-10-04 MED ORDER — ACETAMINOPHEN 325 MG PO TABS: 650.0000 mg | ORAL_TABLET | ORAL | Status: DC | PRN

## 2015-10-04 MED ORDER — PNEUMOCOCCAL VAC POLYVALENT 25 MCG/0.5ML IJ INJ
0.5000 mL | INJECTION | INTRAMUSCULAR | Status: AC
  Administered 2015-10-06: 0.5 mL via INTRAMUSCULAR

## 2015-10-04 MED ORDER — MOMETASONE FURO-FORMOTEROL FUM 100-5 MCG/ACT IN AERO
2.0000 | INHALATION_SPRAY | Freq: Two times a day (BID) | RESPIRATORY_TRACT | Status: DC
  Administered 2015-10-04 – 2015-10-07 (×6): 2 via RESPIRATORY_TRACT
  Filled 2015-10-04 (×2): qty 8.8

## 2015-10-04 MED ORDER — IBUPROFEN 600 MG PO TABS: 600.0000 mg | ORAL_TABLET | Freq: Three times a day (TID) | ORAL | Status: DC | PRN

## 2015-10-04 MED ORDER — CARBAMAZEPINE 200 MG PO TABS
200.0000 mg | ORAL_TABLET | Freq: Two times a day (BID) | ORAL | Status: DC
  Administered 2015-10-05 – 2015-10-07 (×5): 200 mg via ORAL
  Filled 2015-10-04 (×7): qty 1

## 2015-10-04 MED ORDER — MIRTAZAPINE 15 MG PO TABS
15.0000 mg | ORAL_TABLET | Freq: Every day | ORAL | Status: DC
  Administered 2015-10-04 – 2015-10-06 (×3): 15 mg via ORAL
  Filled 2015-10-04 (×5): qty 1

## 2015-10-04 MED ORDER — PANTOPRAZOLE SODIUM 40 MG PO TBEC
40.0000 mg | DELAYED_RELEASE_TABLET | Freq: Every day | ORAL | Status: DC
  Administered 2015-10-05 – 2015-10-07 (×3): 40 mg via ORAL
  Filled 2015-10-04 (×4): qty 1

## 2015-10-04 MED ORDER — ONDANSETRON HCL 4 MG PO TABS: 4.0000 mg | ORAL_TABLET | Freq: Three times a day (TID) | ORAL | Status: DC | PRN

## 2015-10-04 MED ORDER — FENOFIBRATE 160 MG PO TABS
160.0000 mg | ORAL_TABLET | Freq: Every day | ORAL | Status: DC
  Administered 2015-10-05 – 2015-10-07 (×3): 160 mg via ORAL
  Filled 2015-10-04 (×4): qty 1

## 2015-10-04 MED ORDER — ALUM & MAG HYDROXIDE-SIMETH 200-200-20 MG/5ML PO SUSP: 30.0000 mL | ORAL | Status: DC | PRN

## 2015-10-04 MED ORDER — ACETAMINOPHEN 325 MG PO TABS: 650.0000 mg | ORAL_TABLET | Freq: Four times a day (QID) | ORAL | Status: DC | PRN

## 2015-10-04 MED ORDER — HYDROXYZINE HCL 25 MG PO TABS
25.0000 mg | ORAL_TABLET | Freq: Three times a day (TID) | ORAL | Status: DC | PRN
  Filled 2015-10-04: qty 10

## 2015-10-04 MED ORDER — INSULIN ASPART 100 UNIT/ML ~~LOC~~ SOLN: 0.0000 [IU] | Freq: Three times a day (TID) | SUBCUTANEOUS | Status: DC

## 2015-10-04 NOTE — Progress Notes (Signed)
[facility].  Service of Dr. Jama Flavorsobos, MD.  Bed expected later today. Rosey BathKelly Shaliyah Taite, RN
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
## 2015-10-04 NOTE — ED Notes (Signed)
Patient reports SI without a plan and anxiety. Plan of care of discussed with patient. Patient voices no complaints or concerns at this time. Encouragement and support provided and safety maintai. Q 15 min safety checks remain in place.

## 2015-10-04 NOTE — BH Assessment (Signed)
[facility] Assessment Progress Note  Per Thedore MinsMojeed Akintayo, MD, this pt requires psychiatric hospitalization at this time.  Rosey BathKelly Southard, RN, [facility]  Pt has signed Voluntary Admission and Consent for Treatment, as well as Consent to Release Information, and signed [facility].  Pt's nurse, Karoline Caldwellngie, has been notified, and agrees to send original paperwork along with pt via [facility] [place], and to call report to [phone].  Doylene Canninghomas Estevon Fluke, MA Triage Specialist [phone]

## 2015-10-04 NOTE — BH Assessment (Signed)
Patient was reassessed by TTS.   Patient was in her room on her bed and started to cry stating that she cannot be here stating "I can't do this, I'm having flashbacks, I keep having panic attacks." Patient was rocking back and forth and requested "something from my purse to hold." Patient denies SI and HI stating that she came into the hospital so that she would not hurt herself or anyone else. Patient denies AVH.  Patient requested to move to another room due to acuity on her side of the hall.  Patient was given a stress ball , coloring sheets, crayons, and a brown paper bag to breathe into. Patient started to calm down. Patients nurse informed that patient requested to move and that she was given a stress ball, crayons, coloring sheets, and a brown paper bag.   Informed Psychiatry team and Dr. Jannifer FranklinAkintayo and Nanine MeansJamison Lord, DNP continue to recommend inpatient treatment at this time.   Stacy PokeJoVea Avalyn Molino, LCSW Therapeutic Triage Specialist [facility] 10/04/2015 11:22 AM

## 2015-10-04 NOTE — ED Notes (Signed)
[facility] Thank You! [facility]. Pt left in no acute distress. Belongings signed for and [facility]. Pt left in no acute distress.

## 2015-10-05 DIAGNOSIS — R45851 Suicidal ideations: Secondary | ICD-10-CM

## 2015-10-05 DIAGNOSIS — F1210 Cannabis abuse, uncomplicated: Secondary | ICD-10-CM

## 2015-10-05 LAB — GLUCOSE, CAPILLARY
GLUCOSE-CAPILLARY: 88 mg/dL (ref 65–99)
GLUCOSE-CAPILLARY: 95 mg/dL (ref 65–99)
GLUCOSE-CAPILLARY: 85 mg/dL (ref 65–99)
Glucose-Capillary: 101 mg/dL — ABNORMAL HIGH (ref 65–99)

## 2015-10-05 MED ORDER — QUETIAPINE FUMARATE 200 MG PO TABS
200.0000 mg | ORAL_TABLET | Freq: Every day | ORAL | Status: DC
  Administered 2015-10-05 – 2015-10-06 (×2): 200 mg via ORAL
  Filled 2015-10-05 (×3): qty 1

## 2015-10-05 NOTE — Progress Notes (Signed)
Stacy Pierce is seen out in the milieu...attending her groups per poc. She takes all medications as planned. She is appropriate in her behavior as well as her conversation.She shares what she has learned about her disorder as well as her behavior ..  how much better she feels since she " is not stuck anymore"!!! A SHe completed her daily assessment and on it she wrote she denied SI and she rated her feelings of depression, hopelessnes and anxiety " 4/0/3", respectively. R Safeyt in place and reinforcement offered.

## 2015-10-05 NOTE — Progress Notes (Signed)
Report was received from T. Eliezer ChampagneMiddleton RN. Patient received her scheduled medications after writer spoke with her roommate concerning the tidiness of the room and bathroom. Patient at first refused to be in the room because of the this issue but was more cooperative once this was resolved and her roommate cleaned up the room and moved her items to her side of the room. Support given and safety maintained on unit with 15 min checks.
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
## 2015-10-05 NOTE — Progress Notes (Signed)
Adult Psychoeducational Group Note  Date:  10/05/2015 Time:  9:05 PM  Group Topic/Focus:  Wrap-Up Group:   The focus of this group is to help patients review their daily goal of treatment and discuss progress on daily workbooks.  Participation Level:  Active  Participation Quality:  Appropriate and Attentive  Affect:  Appropriate  Cognitive:  Appropriate  Insight: Appropriate  Engagement in Group:  Engaged  Modes of Intervention:  Discussion  Additional Comments:  Pt stated her goal for today was to "stop being yes ma'am." which she accomplished. She told her husband she was not ready to come home yet. Pt learned 1 coping skill, to take time out for myself. Pt states she wants to work on learning new coping skills for her anger, because she usually uses unhealthy behaviors when angry.  Caswell CorwinOwen, Malikye Reppond C 10/05/2015, 9:05 PM
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
## 2015-10-05 NOTE — [facility] Group Notes (Signed)
[facility] LCSW Group Therapy  10/05/2015   10:00 AM   Type of Therapy:  Group Therapy  Participation Level:  Active  Participation Quality:  Appropriate and Attentive  Affect:  Calm  Cognitive:  Alert and Appropriate  Insight:  Developing/Improving and Engaged  Engagement in Therapy:  Developing/Improving and Engaged  Modes of Intervention:  Clarification, Confrontation, Discussion, Education, Exploration, Limit-setting, Orientation, Problem-solving, Rapport Building, Dance movement psychotherapisteality Testing, Socialization and Support  Summary of Progress/Problems: Today's group topic was healthy coping skills. Group members discussed positive, healthy coping skills that have worked for them in the past and were able to relate to one another on what has worked/hasn't worked. Group members were asked to process how these positive coping skills could be utilized in the future, to prevent relapse.  Pt shared that she came to the hospital after being off of her medications for 7 months.  Pt states she is knows she needs medication to function and be stable.  Pt discussed singing being her main coping skill and could relate to peers on the benefits of coloring.  Pt actively participated in group discussion and engaged peers to talk as well.    Jeanelle MallingChelsea Kanchan Gal, [place] 10/05/2015 1:37 PM

## 2015-10-06 DIAGNOSIS — F3132 Bipolar disorder, current episode depressed, moderate: Secondary | ICD-10-CM

## 2015-10-06 LAB — GLUCOSE, CAPILLARY
GLUCOSE-CAPILLARY: 83 mg/dL (ref 65–99)
GLUCOSE-CAPILLARY: 119 mg/dL — AB (ref 65–99)
Glucose-Capillary: 82 mg/dL (ref 65–99)
Glucose-Capillary: 86 mg/dL (ref 65–99)

## 2015-10-06 LAB — LIPID PANEL
CHOL/HDL RATIO: 2.7 ratio
CHOLESTEROL: 97 mg/dL (ref 0–200)
HDL: 36 mg/dL — AB (ref >40)
LDL Cholesterol: 45 mg/dL (ref 0–99)
TRIGLYCERIDES: 80 mg/dL (ref <150)
VLDL: 16 mg/dL (ref 0–40)

## 2015-10-06 LAB — TSH: TSH: 3.812 u[IU]/mL (ref 0.350–4.500)

## 2015-10-06 NOTE — [facility] Group Notes (Signed)
[facility] Group Notes:  (Nursing/MHT/Case Management/Adjunct)  Date:  10/06/2015  Time:  7:40 PM  Type of Therapy:  Psychoeducational Skills  Participation Level:  Active  Participation Quality:  Appropriate  Affect:  Appropriate  Cognitive:  Appropriate  Insight:  Lacking  Engagement in Group:  Engaged  Modes of Intervention:  Discussion and Education  Summary of Progress/Problems: Patient attended group and received daily workbook.   Marzetta BoardDopson, Lennix Rotundo E 10/06/2015, 7:40 PM

## 2015-10-06 NOTE — Progress Notes (Signed)
Patient ID: Stacy Pierce, female   DOB: [birth year], 45 y.o.   MRN: [id number]  DAR: Pt. Denies SI/HI and A/V Hallucinations. She reports sleep is good, appetite is good, energy level is normal, and concentration is good. She rates depression 3/10, hopelessness 0/10, and anxiety 0/10. Patient does not report any pain or discomfort at this time. Support and encouragement provided to the patient. Scheduled medications administered to patient per physician's orders. Patient is minimal and cooperative. She was seen in the hallway this afternoon having a phone conversation and appeared agitated. Nursing staff instructed patient to take a break from the phone. Patient was compliant with this request. She reported to writer later she was irritated with her husband because he called and put her children on the phone to speak with her but she wanted to speak with them after her discharge. Q15 minute checks are maintained for safety.

## 2015-10-06 NOTE — Progress Notes (Signed)
[facility]  10/06/2015 2:09 PM [name]  MRN:  [id number] Subjective:  Patient report " I am ready to leave."  Objective:Stacy Pierce is awake, alert and oriented X4 , found resting in her bedroom.  Denies suicidal or homicidal ideation. Denies auditory or visual hallucination and does not appear to be responding to internal stimuli. Patient denies depression or depression symptome's. Patient reports I miss my family and animals theres no need for me to be here. Patient reports she is medication compliant without mediation side effects. Report learning new coping skills and states that she is going to use these new strategies  to help her after discharge. Reports good appetite and resting well. Patient report she is excited to be discharge. Support, encouragement and reassurance was provided.   Principal Problem: Bipolar affective (HCC) Diagnosis:   Patient Active Problem List   Diagnosis Date Noted  . Marijuana abuse [F12.10]   . Bipolar affective (HCC) [F31.9] 10/04/2015  . Bipolar disorder current episode depressed (HCC) [F31.30] 10/03/2015  . Depression [F32.9]   . Suicidal ideation [R45.851]   . SIRS (systemic inflammatory response syndrome) (HCC) [R65.10] 10/23/2013  . Enterococcus UTI [N39.0, B95.2] 10/23/2013  . Hypokalemia [E87.6] 10/20/2013  . Other and unspecified hyperlipidemia [E78.5] 10/18/2013  . Hypertriglyceridemia [E78.1] 10/18/2013  . Leukocytosis, unspecified [D72.829] 10/18/2013  . Abdominal pain, epigastric [R10.13] 10/18/2013  . Pancreatitis, acute [K85.9] 10/18/2013  . DKA (diabetic ketoacidoses) (HCC) [E13.10] 10/17/2013  . ASCUS pap with negative HPV [R87.610] 08/15/2013  . Acute cholecystitis [K81.0] 11/09/2011  . THYROMEGALY [E04.9] 04/02/2010  . FATIGUE [R53.81, R53.83] 04/02/2010  . ABDOMINAL PAIN [R10.9] 05/06/2009  . TOBACCO ABUSE [F17.200] 10/18/2008  . DEPRESSIVE DISORDER NOT ELSEWHERE CLASSIFIED [F32.9] 10/18/2008  . COUGH, CHRONIC  [R05] 10/18/2008  . Esaw DaceSPRAIN/STRAIN, SUPRASPINATUS [W09.811B][S46.819A] 04/12/2007   Total Time spent with patient: 30 minutes  Past Psychiatric History: See Above  Past Medical History:  Past Medical History  Diagnosis Date  . Gallstones   . Depression   . Anxiety   . COPD (chronic obstructive pulmonary disease) (HCC)   . Bipolar 1 disorder (HCC)   . Other and unspecified hyperlipidemia 10/18/2013  . Hypertriglyceridemia 10/18/2013  . Diabetes mellitus without complication (HCC)   . Neuropathy [facility](HCC)     Past Surgical History  Procedure Laterality Date  . Cholecystectomy  11/07/2011  . Cholecystectomy  11/09/2011    Procedure: LAPAROSCOPIC CHOLECYSTECTOMY;  Surgeon: Lodema PilotBrian Layton, DO;  Location: [facility] OR;  Service: General;  Laterality: N/A;  Lap. Chole. with IOC   Family History:  Family History  Problem Relation Age of Onset  . Hypertension Other   . Diabetes Other   . Kidney failure Other   . CAD Other    Family Psychiatric  History: See Above Social History:  History  Alcohol Use No     History  Drug Use No    Social History   Social History  . Marital Status: Single    Spouse Name: N/A  . Number of Children: N/A  . Years of Education: N/A   Social History Main Topics  . Smoking status: Current Every Day Smoker -- 0.50 packs/day    Types: Cigarettes  . Smokeless tobacco: Never Used  . Alcohol Use: No  . Drug Use: No  . Sexual Activity: Yes    Birth Control/ Protection: IUD   Other Topics Concern  . None   Social History Narrative   Additional Social History:  Sleep: Fair  Appetite:  Fair  Current Medications: Current Facility-Administered Medications  Medication Dose Route Frequency Provider Last Rate Last Dose  . acetaminophen (TYLENOL) tablet 650 mg  650 mg Oral Q4H PRN Charm Rings, NP      . alum & mag hydroxide-simeth (MAALOX/MYLANTA) 200-200-20 MG/5ML suspension 30 mL  30 mL Oral Q4H PRN Charm Rings, NP      .  atorvastatin (LIPITOR) tablet 40 mg  40 mg Oral q1800 Charm Rings, NP   40 mg at 10/05/15 1707  . carbamazepine (TEGRETOL) tablet 200 mg  200 mg Oral BID PC Charm Rings, NP   200 mg at 10/06/15 0916  . fenofibrate tablet 160 mg  160 mg Oral Daily Charm Rings, NP   160 mg at 10/06/15 1610  . hydrOXYzine (ATARAX/VISTARIL) tablet 25 mg  25 mg Oral TID PRN Charm Rings, NP      . ibuprofen (ADVIL,MOTRIN) tablet 600 mg  600 mg Oral Q8H PRN Charm Rings, NP      . insulin aspart (novoLOG) injection 0-15 Units  0-15 Units Subcutaneous TID WC Charm Rings, NP   Stopped at 10/05/15 1200  . insulin glargine (LANTUS) injection 15 Units  15 Units Subcutaneous QHS Charm Rings, NP   15 Units at 10/05/15 2209  . magnesium hydroxide (MILK OF MAGNESIA) suspension 30 mL  30 mL Oral Daily PRN Charm Rings, NP      . mirtazapine (REMERON) tablet 15 mg  15 mg Oral QHS Charm Rings, NP   15 mg at 10/05/15 2208  . mometasone-formoterol (DULERA) 100-5 MCG/ACT inhaler 2 puff  2 puff Inhalation BID Charm Rings, NP   2 puff at 10/06/15 0914  . nicotine (NICODERM CQ - dosed in mg/24 hours) patch 21 mg  21 mg Transdermal Daily Charm Rings, NP   21 mg at 10/06/15 0915  . ondansetron (ZOFRAN) tablet 4 mg  4 mg Oral Q8H PRN Charm Rings, NP      . pantoprazole (PROTONIX) EC tablet 40 mg  40 mg Oral Daily Charm Rings, NP   40 mg at 10/06/15 0916  . QUEtiapine (SEROQUEL) tablet 200 mg  200 mg Oral QHS Oneta Rack, NP   200 mg at 10/05/15 2208  . tiotropium (SPIRIVA) inhalation capsule 18 mcg  18 mcg Inhalation Daily Charm Rings, NP   18 mcg at 10/06/15 0915    Lab Results:  Results for orders placed or performed during the hospital encounter of 10/04/15 (from the past 48 hour(s))  Glucose, capillary     Status: Abnormal   Collection Time: 10/04/15  9:51 PM  Result Value Ref Range   Glucose-Capillary 111 (H) 65 - 99 mg/dL  Glucose, capillary     Status: None   Collection Time: 10/05/15   6:27 AM  Result Value Ref Range   Glucose-Capillary 88 65 - 99 mg/dL  Glucose, capillary     Status: None   Collection Time: 10/05/15 11:45 AM  Result Value Ref Range   Glucose-Capillary 95 65 - 99 mg/dL  Glucose, capillary     Status: None   Collection Time: 10/05/15  4:42 PM  Result Value Ref Range   Glucose-Capillary 85 65 - 99 mg/dL   Comment 1 Notify RN    Comment 2 Document in Chart   Glucose, capillary     Status: Abnormal   Collection Time: 10/05/15  8:38 PM  Result  Value Ref Range   Glucose-Capillary 101 (H) 65 - 99 mg/dL   Comment 1 Notify RN   Glucose, capillary     Status: None   Collection Time: 10/06/15  5:59 AM  Result Value Ref Range   Glucose-Capillary 82 65 - 99 mg/dL  Lipid panel     Status: Abnormal   Collection Time: 10/06/15  6:10 AM  Result Value Ref Range   Cholesterol 97 0 - 200 mg/dL   Triglycerides 80 <956 mg/dL   HDL 36 (L) >21 mg/dL   Total CHOL/HDL Ratio 2.7 RATIO   VLDL 16 0 - 40 mg/dL   LDL Cholesterol 45 0 - 99 mg/dL    Comment:        Total Cholesterol/HDL:CHD Risk Coronary Heart Disease Risk Table                     Men   Women  1/2 Average Risk   3.4   3.3  Average Risk       5.0   4.4  2 X Average Risk   9.6   7.1  3 X Average Risk  23.4   11.0        Use the calculated Patient Ratio above and the CHD Risk Table to determine the patient's CHD Risk.        ATP III CLASSIFICATION (LDL):  <100     mg/dL   Optimal  308-657  mg/dL   Near or Above                    Optimal  130-159  mg/dL   Borderline  846-962  mg/dL   High  >952     mg/dL   Very High Performed at [facility]   TSH     Status: None   Collection Time: 10/06/15  6:10 AM  Result Value Ref Range   TSH 3.812 0.350 - 4.500 uIU/mL    Comment: Performed at [facility]  Glucose, capillary     Status: None   Collection Time: 10/06/15 12:16 PM  Result Value Ref Range   Glucose-Capillary 86 65 - 99 mg/dL   Comment 1 Notify RN    Comment 2  Document in Chart     Blood Alcohol level:  Lab Results  Component Value Date   ETH <5 10/02/2015   [facility]  05/07/2009    <5        LOWEST DETECTABLE LIMIT FOR SERUM ALCOHOL IS 5 mg/dL FOR MEDICAL PURPOSES ONLY    Physical Findings: AIMS: Facial and Oral Movements Muscles of Facial Expression: None, normal Lips and Perioral Area: None, normal Jaw: None, normal Tongue: None, normal,Extremity Movements Upper (arms, wrists, hands, fingers): None, normal Lower (legs, knees, ankles, toes): None, normal, Trunk Movements Neck, shoulders, hips: None, normal, Overall Severity Severity of abnormal movements (highest score from questions above): None, normal Incapacitation due to abnormal movements: None, normal Patient's awareness of abnormal movements (rate only patient's report): No Awareness, Dental Status Current problems with teeth and/or dentures?: No Does patient usually wear dentures?: No  CIWA:    COWS:     Musculoskeletal: Strength & Muscle Tone: within normal limits Gait & Station: normal Patient leans: N/A  Psychiatric Specialty Exam: Review of Systems  Psychiatric/Behavioral: Positive for depression. Negative for suicidal ideas and hallucinations. The patient is nervous/anxious.   All other systems reviewed and are negative.   Blood pressure 135/96, pulse 90, temperature 98.9  F (37.2 C), temperature source Oral, resp. rate 18, height 5' 5.5" (1.664 m), weight 57.607 kg (127 lb), SpO2 100 %.Body mass index is 20.81 kg/(m^2).  General Appearance: Casual and Guarded  Eye Contact::  Good  Speech:  Clear and Coherent  Volume:  Normal  Mood:  Anxious and Depressed  Affect:  Congruent  Thought Process:  Goal Directed  Orientation:  Full (Time, Place, and Person)  Thought Content:  Hallucinations: None  Suicidal Thoughts:  No  Homicidal Thoughts:  No  Memory:  Immediate;   Fair Recent;   Fair Remote;   Fair  Judgement:  Fair  Insight:  Fair  Psychomotor Activity:   Restlessness- improving  Concentration:  Fair  Recall:  [employer] of Knowledge:Fair  Language: Good  Akathisia:  No  Handed:  Right  AIMS (if indicated):     Assets:  Resilience Social Support  ADL's:  Intact  Cognition: WNL  Sleep:  Number of Hours: 6.5     I agree with current treatment plan on 10/06/2015, Patient seen face-to-face for psychiatric evaluation follow-up, chart reviewed. Reviewed the information documented and agree with the treatment plan.  Treatment Plan Summary: Daily contact with patient to assess and evaluate symptoms and progress in treatment and Medication management   Continue Tegretol  PO BID mood stabilization. Continue with Remeron  mg for insomnia/mood stabilization Start Seroquel 200 mg PO QHS for mood stabilization Will continue to monitor vitals ,medication compliance and treatment side effects while patient is here.  Reviewed labs Glucose 108 elevated ,BAL-0, UDS - pos for THC and benzodiazepines. CSW will start working on disposition.  Patient to participate in therapeutic milieu  Oneta Rack, NP 10/06/2015, 2:09 PM  I agreed with findings and treatment plan of this patient

## 2015-10-06 NOTE — Progress Notes (Signed)
The patient's yellow folder containing metal tabs was placed in her locker last evening.

## 2015-10-06 NOTE — Progress Notes (Signed)
Patient has been up and active on the unit, attended group this evening and has voiced no complaints.She has been in the dayroom interacting appropriately with peers. She praised staff for all the help and support she has received since being here. Patient currently denies having pain, -si/hi/a/v hall. Support and encouragement offered, safety maintained on unit, will continue to monitor.

## 2015-10-06 NOTE — Progress Notes (Signed)
Adult Psychoeducational Group Note  Date:  10/06/2015 Time:  9:15 PM  Group Topic/Focus:  Wrap-Up Group:   The focus of this group is to help patients review their daily goal of treatment and discuss progress on daily workbooks.  Participation Level:  Active  Participation Quality:  Appropriate and Attentive  Affect:  Appropriate  Cognitive:  Appropriate  Insight: Appropriate  Engagement in Group:  Engaged  Modes of Intervention:  Discussion  Additional Comments:  Pts played a therapeutic activity of Wellness Jeopardy.  Cornelious Diven C 10/06/2015, 9:15 PM 

## 2015-10-06 NOTE — [facility] Group Notes (Signed)
10/06/2015  10:00 AM   Type of Therapy and Topic: Group Therapy: Developing Self Esteem and Improving Support System   Participation Level: Engaged well with group today. Willing to participate with support from facilitator.   Description of Group:   Participants chose group topic based on their identified challenges. Participants discussed challenges of poor self-esteem and asked questions to gain better understanding of the issue. Group discussion processed roots of self-esteem in order to identify a plan to work on self-esteem.   Therapeutic Goals Addressed in Processing Group:               1)  Identify self-esteem and how it is influenced of others.             2)  Acknowledge the importance of being honest with yourself about feelings and self-worth.             3)  Identify supports that will affirm your esteem.             4)  Identify activities to acceptance of self.    Summary of Patient Progress:   Patient engaged well in the topic but identified her fear of change as a major challenge to pursuing treatment and support. Patient was very acceptable to the idea of incremental change in moving toward her goals.   Beverly Sessionsywan J Kristel Durkee MSW, LCSW

## 2015-10-07 LAB — HEMOGLOBIN A1C
HEMOGLOBIN A1C: 5.5 % (ref 4.8–5.6)
Mean Plasma Glucose: 111 mg/dL

## 2015-10-07 LAB — GLUCOSE, CAPILLARY
Glucose-Capillary: 85 mg/dL (ref 65–99)
Glucose-Capillary: 88 mg/dL (ref 65–99)

## 2015-10-07 LAB — PROLACTIN: Prolactin: 42.3 ng/mL — ABNORMAL HIGH (ref 4.8–23.3)

## 2015-10-07 MED ORDER — TIOTROPIUM BROMIDE MONOHYDRATE 18 MCG IN CAPS: 18.0000 ug | ORAL_CAPSULE | Freq: Every day | RESPIRATORY_TRACT | Status: DC

## 2015-10-07 MED ORDER — CARBAMAZEPINE 200 MG PO TABS: 200.0000 mg | ORAL_TABLET | Freq: Two times a day (BID) | ORAL | Status: DC

## 2015-10-07 MED ORDER — FENOFIBRATE 160 MG PO TABS: 160.0000 mg | ORAL_TABLET | Freq: Every day | ORAL | Status: DC

## 2015-10-07 MED ORDER — MIRTAZAPINE 15 MG PO TABS: 15.0000 mg | ORAL_TABLET | Freq: Every day | ORAL | Status: DC

## 2015-10-07 MED ORDER — HYDROXYZINE HCL 25 MG PO TABS: 25.0000 mg | ORAL_TABLET | Freq: Three times a day (TID) | ORAL | Status: DC | PRN

## 2015-10-07 MED ORDER — ATORVASTATIN CALCIUM 40 MG PO TABS: 40.0000 mg | ORAL_TABLET | Freq: Every day | ORAL | Status: DC

## 2015-10-07 MED ORDER — PANTOPRAZOLE SODIUM 40 MG PO TBEC: 40.0000 mg | DELAYED_RELEASE_TABLET | Freq: Every day | ORAL | Status: DC

## 2015-10-07 MED ORDER — QUETIAPINE FUMARATE 200 MG PO TABS: 200.0000 mg | ORAL_TABLET | Freq: Every day | ORAL | Status: DC

## 2015-10-07 MED ORDER — NICOTINE 21 MG/24HR TD PT24: 21.0000 mg | MEDICATED_PATCH | Freq: Every day | TRANSDERMAL | Status: DC

## 2015-10-07 NOTE — Progress Notes (Signed)
  [facility]  Will you be returning to the same living situation after discharge:  Yes,  Pt returning home At discharge, do you have transportation home?: Yes,  Pt husband to pick up Do you have the ability to pay for your medications: Yes,  Pt provided with supply and prescriptions  Release of information consent forms completed and in the chart;  Patient's signature needed at discharge.  Patient to Follow up at: Follow-up Information    [facility]   Why:  You may walk-in between 8am-12pm Monday-Friday to be evaluated for therapy and medication management.   Contact information:   [address]  [place], [place] [address] Phone: [phone] Fax: [phone]      Next level of care provider has [facility] to [facility] Link:no  Safety Planning and Suicide Prevention discussed: Yes,  with husband  Have you used any form of tobacco in the last 30 days? (Cigarettes, Smokeless Tobacco, Cigars, and/or Pipes): Yes  Has patient been referred to the Quitline?: Patient refused referral  Patient has been referred for addiction treatment: Yes- see above  Elaina HoopsCarter, Lauretta Sallas M 10/07/2015, 9:55 AM

## 2015-10-07 NOTE — Progress Notes (Signed)
Recreation Therapy Notes  Date: 03.27.2017 Time: 9:30am Location: [address] Group Room   Group Topic: Stress Management  Goal Area(s) Addresses:  Patient will actively participate in stress management techniques presented during session.   Behavioral Response: Appropriate   Intervention: Stress management techniques  Activity :  Diaphragmatic Breathing and Guided Imagery. LRT provided education, instruction and demonstration on practice of  Diaphragmatic Breathing and Guided Imagery. Patient was asked to participate in technique introduced during session.   Education:  Stress Management, Discharge Planning.   Education Outcome: Acknowledges education  Clinical Observations/Feedback: Patient actively engaged in technique introduced, expressed no concerns and demonstrated ability to practice independently post d/c.   Marykay Lexenise L Woodie Degraffenreid, LRT/CTRS         Jearl KlinefelterBlanchfield, Arch Methot L 10/07/2015 12:56 PM

## 2015-10-07 NOTE — Tx Team (Signed)
Interdisciplinary Treatment Plan Update (Adult) Date: 10/07/2015   Date: 10/07/2015 8:46 AM  Progress in Treatment:  Attending groups: Yes  Participating in groups: Yes  Taking medication as prescribed: Yes  Tolerating medication: Yes  Family/Significant othe contact made: No, CSW assessing for appropriate contact Patient understands diagnosis: Yes AEB seeking help with depression Discussing patient identified problems/goals with staff: Yes  Medical problems stabilized or resolved: Yes  Denies suicidal/homicidal ideation: Yes Patient has not harmed self or Others: Yes   New problem(s) identified: None identified at this time.   Discharge Plan or Barriers: Pt will return home and follow-up with [facility] [place]  Additional comments:  Patient and CSW reviewed pt's identified goals and treatment plan. Patient verbalized understanding and agreed to treatment plan. CSW reviewed [facility] "Discharge Process and Patient Involvement" Form. Pt verbalized understanding of information provided and signed form.   Reason for Continuation of Hospitalization:  Anxiety Depression Medication stabilization   Estimated length of stay: 0 days  Review of initial/current patient goals per problem list:   1.  Goal(s): Patient will participate in aftercare plan  Met:  Yes  Target date: 3-5 days from date of admission   As evidenced by: Patient will participate within aftercare plan AEB aftercare provider and housing plan at discharge being identified.  10/07/15: Pt will return home and follow-[facility] [place]  2.  Goal (s): Patient will exhibit decreased depressive symptoms and suicidal ideations.  Met:  Yes  Target date: 3-5 days from date of admission   As evidenced by: Patient will utilize self rating of depression at 3 or below and demonstrate decreased signs of depression or be deemed stable for discharge by MD.  10/07/15: Pt rates depression at 1/10;  denies SI  3.  Goal(s): Patient will demonstrate decreased signs and symptoms of anxiety.  Met:  Yes  Target date: 3-5 days from date of admission   As evidenced by: Patient will utilize self rating of anxiety at 3 or below and demonstrated decreased signs of anxiety, or be deemed stable for discharge by MD  10/07/15: Pt rates anxiety at 1/10  Attendees:  Patient:    Family:    Physician: Dr. Parke Poisson, MD  10/07/2015 8:46 AM  Nursing: Lars Pinks, RN Case manager  10/07/2015 8:46 AM  Clinical Social Worker Peri Maris, [place] 10/07/2015 8:46 AM  Other: Tilden Fossa, [place] 10/07/2015 8:46 AM  Clinical: Mayra Neer, RN; Gaylan Gerold, RN 10/07/2015 8:46 AM  Other: , RN Charge Nurse 10/07/2015 8:46 AM  Other: Hilda Lias, [place], [place] Work [phone]

## 2015-10-07 NOTE — Progress Notes (Signed)
Patient has been up in the dayroom watching tv, coloring and, attended group this evening. She has voiced no complaints. Patient currently denies having pain, -si/hi/a/v hall. Support and encouragement offered, safety maintained on unit, will continue to monitor.

## 2015-10-07 NOTE — [facility] Suicide Risk Assessment (Signed)
[facility] INPATIENT:  Family/Significant Other Suicide Prevention Education  Suicide Prevention Education:  Education Completed; Stacy Pierce Pierce, Pt's husband [phone], has been identified by the patient as the family member/significant other with whom the patient will be residing, and identified as the person(s) who will aid the patient in the event of a mental health crisis (suicidal ideations/suicide attempt).  With written consent from the patient, the family member/significant other has been provided the following suicide prevention education, prior to the and/or following the discharge of the patient.  The suicide prevention education provided includes the following:  Suicide risk factors  Suicide prevention and interventions  National Suicide Hotline telephone number  [facility] assessment telephone number  [facility] Emergency Assistance 911  [facility] and/or Residential Mobile Crisis Unit telephone number  Request made of family/significant other to:  Remove weapons (e.g., guns, rifles, knives), all items previously/currently identified as safety concern.    Remove drugs/medications (over-the-counter, prescriptions, illicit drugs), all items previously/currently identified as a safety concern.  The family member/significant other verbalizes understanding of the suicide prevention education information provided.  The family member/significant other agrees to remove the items of safety concern listed above.  Stacy Pierce, Stacy Pierce M 10/07/2015, 10:31 AM

## 2015-10-07 NOTE — Progress Notes (Signed)
Patient ID: Stacy NephewGail R Enke, female   DOB: 1971-01-02, 45 y.o.   MRN: [id number]  Pt. Denies SI/HI and A/V hallucinations. Belongings returned to patient at time of discharge. Patient denies any pain or discomfort. Discharge instructions and medications were reviewed with patient. Patient verbalized understanding of both medications and discharge instructions. Patient discharged to lobby where her husband was there to pick her up. Q15 minute safety checks maintained until discharge. No distress upon discharge.

## 2015-10-07 NOTE — [facility] Suicide Risk Assessment (Signed)
[facility] Discharge Suicide Risk Assessment   Principal Problem: Bipolar affective [facility]) Discharge Diagnoses:  Patient Active Problem List   Diagnosis Date Noted  . Marijuana abuse [F12.10]   . Bipolar affective (HCC) [F31.9] 10/04/2015  . Bipolar disorder current episode depressed (HCC) [F31.30] 10/03/2015  . Depression [F32.9]   . Suicidal ideation [R45.851]   . SIRS (systemic inflammatory response syndrome) (HCC) [R65.10] 10/23/2013  . Enterococcus UTI [N39.0, B95.2] 10/23/2013  . Hypokalemia [E87.6] 10/20/2013  . Other and unspecified hyperlipidemia [E78.5] 10/18/2013  . Hypertriglyceridemia [E78.1] 10/18/2013  . Leukocytosis, unspecified [D72.829] 10/18/2013  . Abdominal pain, epigastric [R10.13] 10/18/2013  . Pancreatitis, acute [K85.9] 10/18/2013  . DKA (diabetic ketoacidoses) (HCC) [E13.10] 10/17/2013  . ASCUS pap with negative HPV [R87.610] 08/15/2013  . Acute cholecystitis [K81.0] 11/09/2011  . THYROMEGALY [E04.9] 04/02/2010  . FATIGUE [R53.81, R53.83] 04/02/2010  . ABDOMINAL PAIN [R10.9] 05/06/2009  . TOBACCO ABUSE [F17.200] 10/18/2008  . DEPRESSIVE DISORDER NOT ELSEWHERE CLASSIFIED [F32.9] 10/18/2008  . COUGH, CHRONIC [R05] 10/18/2008  . Esaw Dace [Z61.096E] 04/12/2007    Total Time spent with patient: 30 minutes  Musculoskeletal: Strength & Muscle Tone: within normal limits Gait & Station: normal Patient leans: N/A  Psychiatric Specialty Exam: ROS  Blood pressure 110/76, pulse 91, temperature 98.3 F (36.8 C), temperature source Oral, resp. rate 18, height 5' 5.5" (1.664 m), weight 127 lb (57.607 kg), SpO2 100 %.Body mass index is 20.81 kg/(m^2).  General Appearance: Well Groomed  Eye Contact::  Good  Speech:  Normal Rate409  Volume:  Normal  Mood:  improved, at this time denies depression, presents euthymic   Affect:  Appropriate  Thought Process:  Linear  Orientation:  Full (Time, Place, and Person)  Thought Content:  denies  hallucinations, no delusions , not internally preoccupied   Suicidal Thoughts:  No- denies any suicidal ideations, denies any self injurious ideations   Homicidal Thoughts:  No denies any homicidal or violent ideations   Memory:  recent and remote grossly intact   Judgement:  Other:  improved   Insight:  improved   Psychomotor Activity:  Normal  Concentration:  Good  Recall:  Good  Fund of Knowledge:Good  Language: Good  Akathisia:  Negative  Handed:  Right  AIMS (if indicated):     Assets:  Communication Skills Resilience  Sleep:  Number of Hours: 6  Cognition: WNL  ADL's:  Intact   Mental Status Per Nursing Assessment::   On Admission:  Thoughts of violence towards others, Plan to harm others  Demographic Factors:  45 year old married female . On disability   Loss Factors: Medication non compliance, disability   Historical Factors: History of Bipolar Disorder, history of DM   Risk Reduction Factors:   Responsible for children under 38 years of age, Sense of responsibility to family, Living with another person, especially a relative and Positive coping skills or problem solving skills  Continued Clinical Symptoms:  At this time patient is improved compared to admission presentation- today presents fully alert and attentive, well related, pleasant , mood euthymic, denies depression, affect reactive, no restlessness or agitation, no thought disorder, no SI or HI, no psychotic symptoms, future oriented. She denies any medication side effects   Cognitive Features That Contribute To Risk:  No gross cognitive deficits noted upon discharge. Is alert , attentive, and oriented x 3   Suicide Risk:  Mild:  Suicidal ideation of limited frequency, intensity, duration, and specificity.  There are no identifiable plans, no associated intent, mild  dysphoria and related symptoms, good self-control (both objective and subjective assessment), few other risk factors, and identifiable  protective factors, including available and accessible social support.  Follow-up Information    [facility]   Why:  You may walk-in between 8am-12pm Monday-Friday to be evaluated for therapy and medication management.   Contact information:   [address]  [place], [place] [address] Phone: [phone] Fax: [phone]      Schedule an appointment as soon as possible for a visit with [facility].   Why:  Please call for primary care appointment   Contact information:   [address] [place] [phone]      Plan Of Care/Follow-up recommendations:  Activity:  as tolerated  Diet:  Diabetic Diet Tests:  NA Other:  see below  Patient is requesting discharge and there are no current grounds for involuntary commitment . She plans to return home. She is leaving unit in good spirits . She requested for me and provided  consent for me to inform her husband ( who is in family waiting area) that she is being discharged later today.   Nehemiah MassedOBOS, FERNANDO, MD 10/07/2015, 12:00 PM

## 2015-10-07 NOTE — Discharge Summary (Signed)
Physician Discharge Summary Note  Patient:  Stacy Pierce is an 45 y.o., female MRN:  [id number] DOB:  18-Mar-1971 Patient phone:  [phone] (home)  Patient address:   [address] [place] [address],  Total Time spent with patient: 30 minutes  Date of Admission:  10/04/2015 Date of Discharge: 10/07/2015  Reason for Admission:  Suicidal ideations   Principal Problem: Bipolar affective [facility]) Discharge Diagnoses: Patient Active Problem List   Diagnosis Date Noted  . Marijuana abuse [F12.10]   . Bipolar affective (HCC) [F31.9] 10/04/2015  . Bipolar disorder current episode depressed (HCC) [F31.30] 10/03/2015  . Depression [F32.9]   . Suicidal ideation [R45.851]   . SIRS (systemic inflammatory response syndrome) (HCC) [R65.10] 10/23/2013  . Enterococcus UTI [N39.0, B95.2] 10/23/2013  . Hypokalemia [E87.6] 10/20/2013  . Other and unspecified hyperlipidemia [E78.5] 10/18/2013  . Hypertriglyceridemia [E78.1] 10/18/2013  . Leukocytosis, unspecified [D72.829] 10/18/2013  . Abdominal pain, epigastric [R10.13] 10/18/2013  . Pancreatitis, acute [K85.9] 10/18/2013  . DKA (diabetic ketoacidoses) (HCC) [E13.10] 10/17/2013  . ASCUS pap with negative HPV [R87.610] 08/15/2013  . Acute cholecystitis [K81.0] 11/09/2011  . THYROMEGALY [E04.9] 04/02/2010  . FATIGUE [R53.81, R53.83] 04/02/2010  . ABDOMINAL PAIN [R10.9] 05/06/2009  . TOBACCO ABUSE [F17.200] 10/18/2008  . DEPRESSIVE DISORDER NOT ELSEWHERE CLASSIFIED [F32.9] 10/18/2008  . COUGH, CHRONIC [R05] 10/18/2008  . Esaw Dace [U27.253G] 04/12/2007    Past Psychiatric History:  Stacy Pierce is an 45 y.o. female presenting to [facility] reporting suicidal ideations with a plan to overdose. [name] was admitted for Bipolar affective [facility]) and crisis management.  She was treated with Tegretol  mood stabilization, Remeron  mg for insomnia/mood stabilization, Seroquel 200 mg for mood stabilization Medical problems  were identified and treated as needed.  Home medications were restarted as appropriate.   Improvement was monitored by observation and [name] daily report of symptom reduction.  Emotional and mental status was monitored by daily self inventory reports completed by [name] and clinical staff.  Patient reported continued improvement, denied any new concerns.  Patient had been compliant on medications and denied side effects.  Support and encouragement was provided.    Patient did well during inpatient stay.  At time of discharge, patient rated both depression and anxiety levels to be manageable and minimal.  Patient was able to identify the triggers of emotional crises and de-stabilizations.  Patient identified the positive things in life that would help in dealing with feelings of loss, depression and unhealthy or abusive tendencies.         [name] was evaluated by the treatment team for stability and plans for continued recovery upon discharge.  She was offered further treatment options upon discharge including Residential, Intensive Outpatient and Outpatient treatment.  She will follow up with agencies listed below for medication management and counseling.  Encouraged patient to maintain satisfactory support network and home environment.  Advised to adhere to medication compliance and outpatient treatment follow up.      [name] motivation was an integral factor for scheduling further treatment.  Employment, transportation, bed availability, health status, family support, and any pending legal issues were also considered during her hospital stay.  Upon completion of this admission the patient was both mentally and medically stable for discharge denying suicidal/homicidal ideation, auditory/visual/tactile hallucinations, delusional thoughts and paranoia.      Past Medical History:  Past Medical History  Diagnosis Date  . Gallstones   . Depression   .  Anxiety   . COPD  (chronic obstructive pulmonary disease) (HCC)   . Bipolar 1 disorder (HCC)   . Other and unspecified hyperlipidemia 10/18/2013  . Hypertriglyceridemia 10/18/2013  . Diabetes mellitus without complication (HCC)   . Neuropathy [facility](HCC)     Past Surgical History  Procedure Laterality Date  . Cholecystectomy  11/07/2011  . Cholecystectomy  11/09/2011    Procedure: LAPAROSCOPIC CHOLECYSTECTOMY;  Surgeon: Lodema PilotBrian Layton, DO;  Location: [facility] OR;  Service: General;  Laterality: N/A;  Lap. Chole. with IOC   Family History:  Family History  Problem Relation Age of Onset  . Hypertension Other   . Diabetes Other   . Kidney failure Other   . CAD Other    Family Psychiatric  History:  See above noted   Social History:  History  Alcohol Use No     History  Drug Use No    Social History   Social History  . Marital Status: Single    Spouse Name: N/A  . Number of Children: N/A  . Years of Education: N/A   Social History Main Topics  . Smoking status: Current Every Day Smoker -- 0.50 packs/day    Types: Cigarettes  . Smokeless tobacco: Never Used  . Alcohol Use: No  . Drug Use: No  . Sexual Activity: Yes    Birth Control/ Protection: IUD   Other Topics Concern  . None   Social History Narrative    Hospital Course:  Providence CrosbyGail Courser is an 45 y.o. female presenting to [facility] reporting suicidal ideations with a plan to overdose.    [name] was admitted for Bipolar affective [facility](HCC) and crisis management.  She was treated with Tegretol 200mg  mood stabilization, Remeron 15mg  mg for insomnia/mood stabilization and Seroquel 200 mg  for mood stabilization.  Medical problems were identified and treated as needed.  Home medications were restarted as appropriate.  Improvement was monitored by observation and [name] daily report of symptom reduction.  Emotional and mental status was monitored by daily self inventory reports completed by [name] and clinical staff.  Patient reported  continued improvement, denied any new concerns.  Patient had been compliant on medications and denied side effects.  Support and encouragement was provided.    Patient did well during inpatient stay.  At time of discharge, patient rated both depression and anxiety levels to be manageable and minimal.  Patient was able to identify the triggers of emotional crises and de-stabilizations.  Patient identified the positive things in life that would help in dealing with feelings of loss, depression and unhealthy or abusive tendencies.         Rock NephewGail R Turnbo was evaluated by the treatment team for stability and plans for continued recovery upon discharge.  She was offered further treatment options upon discharge including Residential, Intensive Outpatient and Outpatient treatment.  She will follow up with agencies listed below for medication management and counseling.  Encouraged patient to maintain satisfactory support network and home environment.  Advised to adhere to medication compliance and outpatient treatment follow up.      Rock NephewGail R Sheeran motivation was an integral factor for scheduling further treatment.  Employment, transportation, bed availability, health status, family support, and any pending legal issues were also considered during her hospital stay.  Upon completion of this admission the patient was both mentally and medically stable for discharge denying suicidal/homicidal ideation, auditory/visual/tactile hallucinations, delusional thoughts and paranoia.       Physical Findings: AIMS:  Facial and Oral Movements Muscles of Facial Expression: None, normal Lips and Perioral Area: None, normal Jaw: None, normal Tongue: None, normal,Extremity Movements Upper (arms, wrists, hands, fingers): None, normal Lower (legs, knees, ankles, toes): None, normal, Trunk Movements Neck, shoulders, hips: None, normal, Overall Severity Severity of abnormal movements (highest score from questions above): None,  normal Incapacitation due to abnormal movements: None, normal Patient's awareness of abnormal movements (rate only patient's report): No Awareness, Dental Status Current problems with teeth and/or dentures?: No Does patient usually wear dentures?: No  CIWA:    COWS:     Musculoskeletal: Strength & Muscle Tone: within normal limits Gait & Station: normal Patient leans: N/A  Psychiatric Specialty Exam:  See MD SRA Review of Systems  Unable to perform ROS   Blood pressure 110/76, pulse 91, temperature 98.3 F (36.8 C), temperature source Oral, resp. rate 18, height 5' 5.5" (1.664 m), weight 57.607 kg (127 lb), SpO2 100 %.Body mass index is 20.81 kg/(m^2).  Have you used any form of tobacco in the last 30 days? (Cigarettes, Smokeless Tobacco, Cigars, and/or Pipes): Yes  Has this patient used any form of tobacco in the last 30 days? (Cigarettes, Smokeless Tobacco, Cigars, and/or Pipes) Yes, Rx given  Blood Alcohol level:  Lab Results  Component Value Date   ETH <5 10/02/2015   [facility]  05/07/2009    <5        LOWEST DETECTABLE LIMIT FOR SERUM ALCOHOL IS 5 mg/dL FOR MEDICAL PURPOSES ONLY    Metabolic Disorder Labs:  Lab Results  Component Value Date   HGBA1C 5.5 10/06/2015   MPG 111 10/06/2015   MPG 235* 10/18/2013   Lab Results  Component Value Date   PROLACTIN 42.3* 10/06/2015   Lab Results  Component Value Date   CHOL 97 10/06/2015   TRIG 80 10/06/2015   HDL 36* 10/06/2015   CHOLHDL 2.7 10/06/2015   VLDL 16 10/06/2015   LDLCALC 45 10/06/2015   LDLCALC UNABLE TO CALCULATE IF TRIGLYCERIDE OVER 400 mg/dL 16/04/9603    See Psychiatric Specialty Exam and Suicide Risk Assessment completed by Attending Physician prior to discharge.  Discharge destination:  Home  Is patient on multiple antipsychotic therapies at discharge:  No   Has Patient had three or more failed trials of antipsychotic monotherapy by history:  No  Recommended Plan for Multiple Antipsychotic  Therapies: NA    Medication List    STOP taking these medications        acetaminophen 325 MG tablet  Commonly known as:  TYLENOL     albuterol 108 (90 Base) MCG/ACT inhaler  Commonly known as:  PROVENTIL HFA;VENTOLIN HFA     diphenoxylate-atropine 2.5-0.025 MG tablet  Commonly known as:  LOMOTIL     ibuprofen 200 MG tablet  Commonly known as:  ADVIL,MOTRIN     insulin aspart 100 UNIT/ML injection  Commonly known as:  novoLOG     insulin glargine 100 UNIT/ML injection  Commonly known as:  LANTUS     Insulin Syringe-Needle U-100 29G X 1/2" 0.3 ML Misc  Commonly known as:  SAFETY-GLIDE 0.3CC SYR 29GX1/2     mometasone-formoterol 100-5 MCG/ACT Aero  Commonly known as:  DULERA     nitrofurantoin (macrocrystal-monohydrate) 100 MG capsule  Commonly known as:  MACROBID     ondansetron 8 MG disintegrating tablet  Commonly known as:  ZOFRAN ODT     UNABLE TO FIND      TAKE these medications      Indication  atorvastatin 40 MG tablet  Commonly known as:  LIPITOR  Take 1 tablet (40 mg total) by mouth daily at 6 PM.   Indication:  mood stabilization     carbamazepine 200 MG tablet  Commonly known as:  TEGRETOL  Take 1 tablet (200 mg total) by mouth 2 (two) times daily after a meal.   Indication:  Manic-Depression     fenofibrate 160 MG tablet  Take 1 tablet (160 mg total) by mouth daily.   Indication:  Elevation of Both Cholesterol and Triglycerides in Blood     hydrOXYzine 25 MG tablet  Commonly known as:  ATARAX/VISTARIL  Take 1 tablet (25 mg total) by mouth 3 (three) times daily as needed for anxiety.   Indication:  Anxiety Neurosis     mirtazapine 15 MG tablet  Commonly known as:  REMERON  Take 1 tablet (15 mg total) by mouth at bedtime.   Indication:  Trouble Sleeping, Major Depressive Disorder     nicotine 21 mg/24hr patch  Commonly known as:  NICODERM CQ - dosed in mg/24 hours  Place 1 patch (21 mg total) onto the skin daily.   Indication:  Nicotine  Addiction     pantoprazole 40 MG tablet  Commonly known as:  PROTONIX  Take 1 tablet (40 mg total) by mouth daily.   Indication:  Gastroesophageal Reflux Disease     QUEtiapine 200 MG tablet  Commonly known as:  SEROQUEL  Take 1 tablet (200 mg total) by mouth at bedtime.   Indication:  mood stabilization     tiotropium 18 MCG inhalation capsule  Commonly known as:  SPIRIVA  Place 1 capsule (18 mcg total) into inhaler and inhale daily.   Indication:  Chronic Bronchitis, Chronic Obstructive Lung Disease           Follow-up Information    [facility] with [facility]   Why:  You may walk-in between 8am-12pm Monday-Friday to be evaluated for therapy and medication management.   Contact information:   [address], [place] [address] Phone: [phone] Fax: [phone]      Schedule an appointment as soon as possible for a visit with [facility].   Why:  Please call for primary care appointment   Contact information:   [address] [place] [phone] [phone]      Follow-up recommendations:  Activity:  as tol Diet:  as tol  Comments:  1.  Take all your medications as prescribed.   2.  Report any adverse side effects to outpatient provider. 3.  Patient instructed to not use alcohol or illegal drugs while on prescription medicines. 4.  In the event of worsening symptoms, instructed patient to call 911, the crisis hotline or go to nearest emergency room for evaluation of symptoms.  Signed: Lindwood Qua, NP [facility] 10/07/2015, 12:08 PM   Patient seen, Suicide Assessment Completed.  Disposition Plan Reviewed

## 2016-04-03 ENCOUNTER — Ambulatory Visit (INDEPENDENT_AMBULATORY_CARE_PROVIDER_SITE_OTHER): Payer: Self-pay | Admitting: Internal Medicine

## 2016-04-03 ENCOUNTER — Encounter: Payer: Self-pay | Admitting: Internal Medicine

## 2016-04-03 VITALS — BP 128/82 | HR 94 | Resp 16 | Ht 62.75 in | Wt 117.0 lb

## 2016-04-03 DIAGNOSIS — Z794 Long term (current) use of insulin: Secondary | ICD-10-CM

## 2016-04-03 DIAGNOSIS — B351 Tinea unguium: Secondary | ICD-10-CM

## 2016-04-03 DIAGNOSIS — F314 Bipolar disorder, current episode depressed, severe, without psychotic features: Secondary | ICD-10-CM

## 2016-04-03 DIAGNOSIS — E785 Hyperlipidemia, unspecified: Secondary | ICD-10-CM

## 2016-04-03 DIAGNOSIS — Z23 Encounter for immunization: Secondary | ICD-10-CM

## 2016-04-03 DIAGNOSIS — F17200 Nicotine dependence, unspecified, uncomplicated: Secondary | ICD-10-CM

## 2016-04-03 DIAGNOSIS — Z79899 Other long term (current) drug therapy: Secondary | ICD-10-CM

## 2016-04-03 DIAGNOSIS — E119 Type 2 diabetes mellitus without complications: Secondary | ICD-10-CM | POA: Insufficient documentation

## 2016-04-03 DIAGNOSIS — E118 Type 2 diabetes mellitus with unspecified complications: Secondary | ICD-10-CM

## 2016-04-03 DIAGNOSIS — E781 Pure hyperglyceridemia: Secondary | ICD-10-CM

## 2016-04-03 DIAGNOSIS — E782 Mixed hyperlipidemia: Secondary | ICD-10-CM

## 2016-04-03 LAB — GLUCOSE, POCT (MANUAL RESULT ENTRY): POC GLUCOSE: 173 mg/dL — AB (ref 70–99)
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
## 2016-04-04 LAB — COMPREHENSIVE METABOLIC PANEL
A/G RATIO: 1.7 (ref 1.2–2.2)
ALT: 11 IU/L (ref 0–32)
AST: 12 IU/L (ref 0–40)
Albumin: 4.8 g/dL (ref 3.5–5.5)
Alkaline Phosphatase: 126 IU/L — ABNORMAL HIGH (ref 39–117)
BUN/Creatinine Ratio: 11 (ref 9–23)
BUN: 9 mg/dL (ref 6–24)
Bilirubin Total: 0.3 mg/dL (ref 0.0–1.2)
CALCIUM: 10.1 mg/dL (ref 8.7–10.2)
CO2: 25 mmol/L (ref 18–29)
CREATININE: 0.82 mg/dL (ref 0.57–1.00)
Chloride: 100 mmol/L (ref 96–106)
GFR, EST AFRICAN AMERICAN: 99 mL/min/{1.73_m2} (ref >59)
GFR, EST NON AFRICAN AMERICAN: 85 mL/min/{1.73_m2} (ref >59)
Globulin, Total: 2.9 g/dL (ref 1.5–4.5)
Glucose: 110 mg/dL — ABNORMAL HIGH (ref 65–99)
POTASSIUM: 4.1 mmol/L (ref 3.5–5.2)
Sodium: 141 mmol/L (ref 134–144)
TOTAL PROTEIN: 7.7 g/dL (ref 6.0–8.5)

## 2016-04-04 LAB — CBC WITH DIFFERENTIAL/PLATELET
BASOS: 0 %
Basophils Absolute: 0 10*3/uL (ref 0.0–0.2)
EOS (ABSOLUTE): 0 10*3/uL (ref 0.0–0.4)
EOS: 0 %
HEMATOCRIT: 41 % (ref 34.0–46.6)
HEMOGLOBIN: 13.5 g/dL (ref 11.1–15.9)
IMMATURE GRANS (ABS): 0 10*3/uL (ref 0.0–0.1)
Immature Granulocytes: 0 %
LYMPHS: 34 %
Lymphocytes Absolute: 2.4 10*3/uL (ref 0.7–3.1)
MCH: 29 pg (ref 26.6–33.0)
MCHC: 32.9 g/dL (ref 31.5–35.7)
MCV: 88 fL (ref 79–97)
MONOCYTES: 9 %
Monocytes Absolute: 0.6 10*3/uL (ref 0.1–0.9)
NEUTROS ABS: 3.9 10*3/uL (ref 1.4–7.0)
Neutrophils: 57 %
Platelets: 175 10*3/uL (ref 150–379)
RBC: 4.66 x10E6/uL (ref 3.77–5.28)
RDW: 14.8 % (ref 12.3–15.4)
WBC: 7 10*3/uL (ref 3.4–10.8)

## 2016-04-04 LAB — LIPID PANEL W/O CHOL/HDL RATIO
Cholesterol, Total: 120 mg/dL (ref 100–199)
HDL: 34 mg/dL — AB (ref >39)
LDL CALC: 61 mg/dL (ref 0–99)
Triglycerides: 126 mg/dL (ref 0–149)
VLDL CHOLESTEROL CAL: 25 mg/dL (ref 5–40)

## 2016-04-04 LAB — HGB A1C W/O EAG: HEMOGLOBIN A1C: 5.3 % (ref 4.8–5.6)

## 2016-04-14 ENCOUNTER — Telehealth: Payer: Self-pay | Admitting: Licensed Clinical Social Worker

## 2016-04-14 NOTE — Telephone Encounter (Signed)
LCSW called Dondra SpryGail to confirm counseling appointment. Dondra SpryGail reported that she had set up with [facility] for therapy and medication management.

## 2016-04-15 ENCOUNTER — Other Ambulatory Visit: Payer: Self-pay | Admitting: Licensed Clinical Social Worker

## 2016-05-10 ENCOUNTER — Encounter: Payer: Self-pay | Admitting: Internal Medicine

## 2016-06-15 ENCOUNTER — Ambulatory Visit: Payer: Self-pay

## 2016-06-25 ENCOUNTER — Other Ambulatory Visit: Payer: Self-pay | Admitting: Obstetrics and Gynecology

## 2016-06-25 DIAGNOSIS — Z1231 Encounter for screening mammogram for malignant neoplasm of breast: Secondary | ICD-10-CM

## 2016-07-01 ENCOUNTER — Ambulatory Visit: Payer: Self-pay | Admitting: Internal Medicine

## 2016-07-21 ENCOUNTER — Ambulatory Visit (HOSPITAL_COMMUNITY): Payer: Self-pay

## 2017-10-07 ENCOUNTER — Emergency Department (HOSPITAL_COMMUNITY)
Admission: EM | Admit: 2017-10-07 | Discharge: 2017-10-07 | Disposition: A | Discharge status: Home / Self Care | Payer: Medicaid Other | Attending: Emergency Medicine | Admitting: Emergency Medicine

## 2017-10-07 ENCOUNTER — Encounter (HOSPITAL_COMMUNITY): Payer: Self-pay | Admitting: *Deleted

## 2017-10-07 ENCOUNTER — Ambulatory Visit (HOSPITAL_COMMUNITY)
Admission: RE | Admit: 2017-10-07 | Discharge: 2017-10-07 | Disposition: A | Discharge status: Home / Self Care | Payer: Federal, State, Local not specified - Other | Attending: Psychiatry | Admitting: Psychiatry

## 2017-10-07 DIAGNOSIS — R45851 Suicidal ideations: Secondary | ICD-10-CM | POA: Diagnosis not present

## 2017-10-07 DIAGNOSIS — F17210 Nicotine dependence, cigarettes, uncomplicated: Secondary | ICD-10-CM | POA: Insufficient documentation

## 2017-10-07 DIAGNOSIS — J449 Chronic obstructive pulmonary disease, unspecified: Secondary | ICD-10-CM | POA: Diagnosis not present

## 2017-10-07 DIAGNOSIS — E1140 Type 2 diabetes mellitus with diabetic neuropathy, unspecified: Secondary | ICD-10-CM | POA: Diagnosis not present

## 2017-10-07 DIAGNOSIS — F329 Major depressive disorder, single episode, unspecified: Secondary | ICD-10-CM | POA: Diagnosis present

## 2017-10-07 DIAGNOSIS — Z5900 Homelessness unspecified: Secondary | ICD-10-CM

## 2017-10-07 DIAGNOSIS — F419 Anxiety disorder, unspecified: Secondary | ICD-10-CM | POA: Diagnosis not present

## 2017-10-07 LAB — URINALYSIS, ROUTINE W REFLEX MICROSCOPIC
BILIRUBIN URINE: NEGATIVE
GLUCOSE, UA: 50 mg/dL — AB
Ketones, ur: NEGATIVE mg/dL
LEUKOCYTES UA: NEGATIVE
Nitrite: NEGATIVE
PH: 7 (ref 5.0–8.0)
PROTEIN: NEGATIVE mg/dL
Specific Gravity, Urine: 1.012 (ref 1.005–1.030)

## 2017-10-07 LAB — COMPREHENSIVE METABOLIC PANEL
ALT: 15 U/L (ref 14–54)
ANION GAP: 8 (ref 5–15)
AST: 18 U/L (ref 15–41)
Albumin: 3.9 g/dL (ref 3.5–5.0)
Alkaline Phosphatase: 109 U/L (ref 38–126)
BUN: 10 mg/dL (ref 6–20)
CHLORIDE: 108 mmol/L (ref 101–111)
CO2: 25 mmol/L (ref 22–32)
Calcium: 9.2 mg/dL (ref 8.9–10.3)
Creatinine, Ser: 0.77 mg/dL (ref 0.44–1.00)
GFR calc Af Amer: >60 mL/min (ref >60)
Glucose, Bld: 118 mg/dL — ABNORMAL HIGH (ref 65–99)
POTASSIUM: 3.2 mmol/L — AB (ref 3.5–5.1)
Sodium: 141 mmol/L (ref 135–145)
Total Bilirubin: 0.2 mg/dL — ABNORMAL LOW (ref 0.3–1.2)
Total Protein: 6.9 g/dL (ref 6.5–8.1)

## 2017-10-07 LAB — CBC WITH DIFFERENTIAL/PLATELET
Basophils Absolute: 0 10*3/uL (ref 0.0–0.1)
Basophils Relative: 0 %
EOS ABS: 0.1 10*3/uL (ref 0.0–0.7)
EOS PCT: 1 %
HCT: 36.6 % (ref 36.0–46.0)
Hemoglobin: 12.2 g/dL (ref 12.0–15.0)
LYMPHS ABS: 3.5 10*3/uL (ref 0.7–4.0)
LYMPHS PCT: 52 %
MCH: 30 pg (ref 26.0–34.0)
MCHC: 33.3 g/dL (ref 30.0–36.0)
MCV: 89.9 fL (ref 78.0–100.0)
MONOS PCT: 9 %
Monocytes Absolute: 0.6 10*3/uL (ref 0.1–1.0)
Neutro Abs: 2.5 10*3/uL (ref 1.7–7.7)
Neutrophils Relative %: 38 %
PLATELETS: 227 10*3/uL (ref 150–400)
RBC: 4.07 MIL/uL (ref 3.87–5.11)
RDW: 14.5 % (ref 11.5–15.5)
WBC: 6.6 10*3/uL (ref 4.0–10.5)

## 2017-10-07 LAB — ETHANOL

## 2017-10-07 LAB — ACETAMINOPHEN LEVEL: Acetaminophen (Tylenol), Serum: <10 ug/mL — ABNORMAL LOW (ref 10–30)

## 2017-10-07 LAB — RAPID URINE DRUG SCREEN, HOSP PERFORMED
AMPHETAMINES: NOT DETECTED
BENZODIAZEPINES: POSITIVE — AB
Barbiturates: NOT DETECTED
Cocaine: NOT DETECTED
OPIATES: NOT DETECTED
Tetrahydrocannabinol: POSITIVE — AB

## 2017-10-07 LAB — I-STAT BETA HCG BLOOD, ED (MC, WL, AP ONLY)

## 2017-10-07 LAB — SALICYLATE LEVEL: Salicylate Lvl: <7 mg/dL (ref 2.8–30.0)

## 2017-10-07 MED ORDER — ALUM & MAG HYDROXIDE-SIMETH 200-200-20 MG/5ML PO SUSP: 30.0000 mL | Freq: Four times a day (QID) | ORAL | Status: DC | PRN

## 2017-10-07 MED ORDER — BUSPIRONE HCL 10 MG PO TABS
10.0000 mg | ORAL_TABLET | Freq: Three times a day (TID) | ORAL | Status: DC
  Filled 2017-10-07: qty 1

## 2017-10-07 MED ORDER — ONDANSETRON HCL 4 MG PO TABS: 4.0000 mg | ORAL_TABLET | Freq: Three times a day (TID) | ORAL | Status: DC | PRN

## 2017-10-07 MED ORDER — ACETAMINOPHEN 325 MG PO TABS: 650.0000 mg | ORAL_TABLET | ORAL | Status: DC | PRN

## 2017-10-07 MED ORDER — BUSPIRONE HCL 10 MG PO TABS: 10.0000 mg | ORAL_TABLET | Freq: Three times a day (TID) | ORAL | 0 refills | Status: AC

## 2017-10-07 MED ORDER — NICOTINE 21 MG/24HR TD PT24: 21.0000 mg | MEDICATED_PATCH | Freq: Every day | TRANSDERMAL | Status: DC

## 2017-10-07 NOTE — Progress Notes (Addendum)
Patient ID: Stacy Pierce, female   DOB: [birth year], 47 y.o.   MRN: [id number]  Pt was seen and chart reviewed with treatment team. Pt identifies her life stressors as not being on her medications and being homeless. Pt was at [facility] and was held overnight and discharged today with a script for Seroquel 100 mg PO QHS x 10 tablets. Pt will receive a prescription for Buspar 10 mg TID for her anxiety. Pt will be seen by social work for assistance with housing options. Pt is psychiatrically clear for discharge.   Laveda AbbeLaurie Britton Parks, NP-C 10-07-2017     1536  Patient's chart reviewed and case discussed with the physician extender and developed treatment plan. Reviewed the information documented and agree with the treatment plan.  Juanetta BeetsJacqueline Norman, DO 10/07/17 11:08 PM

## 2017-10-07 NOTE — ED Triage Notes (Signed)
Pt brought in by her therapist from [facility](Dezra Mandella [phone]) for  suicidal  (pt's sts plan to walk into traffic) and homicidal ideations. Per therapist, pt has been off of her regular medications for 2-3 weeks.

## 2017-10-07 NOTE — BH Assessment (Signed)
[facility] Progress Note  Per Laveda AbbeLaurie Britton Parks, FNP, this pt does not require psychiatric hospitalization at this time.  [facility] with recommendation to continue treatment with her unspecified current outpatient provider.  This has been included in pt's discharge instructions.  Pt would like to speak to a Child psychotherapistsocial worker to address other psychosocial needs.  York GriceJonathan Riffey, LCSW agrees to see her.  Pt's nurse, Aram BeechamCynthia, has been notified.  Doylene Canninghomas Bernie Ransford, MA Triage Specialist [phone]

## 2017-10-07 NOTE — ED Notes (Signed)
Patient talking to TTS 

## 2017-10-07 NOTE — H&P (Signed)
Behavioral Health Medical Screening Exam  Stacy Pierce is an [age] y.o. female who presents with suicidal thoughts without a plan. [facility] and discharged with outpatient resources and resources for homelessness. [facility] was [facility] yesterday and discharged after overnight observation. Patient has an appointment tomorrow for medication management.   Total Time spent with patient: 20 minutes  Psychiatric Specialty Exam: Physical Exam  Constitutional: She is oriented to person, place, and time. She appears well-developed and well-nourished. No distress.  HENT:  Head: Normocephalic and atraumatic.  Right Ear: External ear normal.  Left Ear: External ear normal.  Eyes: Conjunctivae are normal. Right eye exhibits no discharge. Left eye exhibits no discharge. No scleral icterus.  Respiratory: Effort normal. No respiratory distress.  Neurological: She is alert and oriented to person, place, and time.  Skin: Skin is warm and dry. She is not diaphoretic.  Psychiatric: Her mood appears anxious. She is not withdrawn and not actively hallucinating. Thought content is not paranoid and not delusional. Cognition and memory are normal. She expresses impulsivity. She does not express inappropriate judgment. She exhibits a depressed mood. She expresses homicidal and suicidal ideation. She expresses no suicidal plans and no homicidal plans.    Review of Systems  Constitutional: Negative for chills, fever and weight loss.  Psychiatric/Behavioral: Positive for depression, hallucinations and suicidal ideas. Negative for memory loss and substance abuse. The patient is nervous/anxious and has insomnia.   All other systems reviewed and are negative.   Blood pressure 110/72, pulse 67, temperature 99 F (37.2 C), resp. rate 18, SpO2 98 %.There is no height or weight on file to calculate BMI.  General Appearance: Casual and Well Groomed  Eye Contact:  Good  Speech:   Clear and Coherent and Normal Rate  Volume:  Normal  Mood:  Anxious and Depressed  Affect:  Appropriate, Congruent and Depressed  Thought Process:  Coherent, Goal Directed and Descriptions of Associations: Intact  Orientation:  Full (Time, Place, and Person)  Thought Content:  Logical and Hallucinations: Auditory  Suicidal Thoughts:  Yes.  without intent/plan  Homicidal Thoughts:  Yes.  without intent/plan  Memory:  Immediate;   Good Recent;   Good  Judgement:  Good  Insight:  Good  Psychomotor Activity:  Normal  Concentration: Concentration: Good and Attention Span: Good  Recall:  Good  Fund of Knowledge:Good  Language: Good  Akathisia:  No  Handed:  Right  AIMS (if indicated):     Assets:  Communication Skills Desire for Improvement Financial Resources/Insurance Leisure Time Physical Health  Sleep:       Musculoskeletal: Strength & Muscle Tone: within normal limits Gait & Station: normal   Blood pressure 110/72, pulse 67, temperature 99 F (37.2 C), resp. rate 18, SpO2 98 %.  Recommendations:  Based on my evaluation the patient does not appear to have an emergency medical condition.   No evidence of imminent risk to self or others at present.   Patient does not meet criteria for psychiatric inpatient admission. Supportive therapy provided about ongoing stressors. Discussed crisis plan, support from social network, calling 911, coming to the Emergency Department, and calling Suicide Hotline. keep appointment for medication managment tomorrow.  Jackelyn PolingJason A Cristi Gwynn, NP 10/07/2017, 10:03 PM
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
## 2017-10-07 NOTE — ED Notes (Signed)
Attempted to give medication to patient.  Patient refused saying, "this is only one of my medications."  "I have a whole list of medications."  When nurse asked patient where she received her medications from patient became upset and said,"don't you all read anything I have already told you!"  "Let me speak to my [facility] worker."

## 2017-10-07 NOTE — Discharge Instructions (Signed)
For your behavioral health needs, you are advised to continue treatment with your current provide.
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
## 2017-10-07 NOTE — ED Notes (Signed)
[facility] social worker, Christiane HaJonathan, in room with patient.

## 2017-10-07 NOTE — Progress Notes (Signed)
Consult request has been received. CSW attempting to follow up at present time.  CSW met with pt to offer resources, initally pt refused but as session progressed, pt accepted meeting schedules for area Alcoholics Anonymous and Narcotics Anonymous 12-Step meetings as well as inpatient/outpatient SA Tx resources.  CSW provided education to the pt as to the efficacy of 12-step programs for community support for those needing support in addition to or other than inpatient/outpatient treatment and provided pt with list of [employer] in [place] and educated the pt on making contact, assessing for open beds and requesting an interview/assessment for admission. Pt appreciated CSW's efforts and thanked the CSW.  Please reconsult if future social work needs arise.  CSW signing off, as social work intervention is no longer needed.  Alphonse Guild. Caoimhe Damron, LCSW, LCAS, CSI Clinical Social Worker Ph: [phone]
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
## 2019-01-25 ENCOUNTER — Other Ambulatory Visit: Payer: Self-pay

## 2019-01-25 ENCOUNTER — Emergency Department (HOSPITAL_BASED_OUTPATIENT_CLINIC_OR_DEPARTMENT_OTHER)
Admission: EM | Admit: 2019-01-25 | Discharge: 2019-01-25 | Disposition: A | Discharge status: Home / Self Care | Payer: Medicaid Other | Attending: Emergency Medicine | Admitting: Emergency Medicine

## 2019-01-25 ENCOUNTER — Encounter (HOSPITAL_BASED_OUTPATIENT_CLINIC_OR_DEPARTMENT_OTHER): Payer: Self-pay | Admitting: Emergency Medicine

## 2019-01-25 DIAGNOSIS — Z79899 Other long term (current) drug therapy: Secondary | ICD-10-CM | POA: Insufficient documentation

## 2019-01-25 DIAGNOSIS — N390 Urinary tract infection, site not specified: Secondary | ICD-10-CM | POA: Insufficient documentation

## 2019-01-25 DIAGNOSIS — F17210 Nicotine dependence, cigarettes, uncomplicated: Secondary | ICD-10-CM | POA: Diagnosis not present

## 2019-01-25 DIAGNOSIS — E119 Type 2 diabetes mellitus without complications: Secondary | ICD-10-CM | POA: Insufficient documentation

## 2019-01-25 DIAGNOSIS — J449 Chronic obstructive pulmonary disease, unspecified: Secondary | ICD-10-CM | POA: Insufficient documentation

## 2019-01-25 DIAGNOSIS — R300 Dysuria: Secondary | ICD-10-CM | POA: Diagnosis present

## 2019-01-25 LAB — URINALYSIS, ROUTINE W REFLEX MICROSCOPIC
Bilirubin Urine: NEGATIVE
Glucose, UA: NEGATIVE mg/dL
Ketones, ur: NEGATIVE mg/dL
Nitrite: POSITIVE — AB
Protein, ur: 100 mg/dL — AB
Specific Gravity, Urine: >1.03 — ABNORMAL HIGH (ref 1.005–1.030)
pH: 6 (ref 5.0–8.0)

## 2019-01-25 LAB — URINALYSIS, MICROSCOPIC (REFLEX): WBC, UA: >50 WBC/hpf (ref 0–5)

## 2019-01-25 LAB — PREGNANCY, URINE: Preg Test, Ur: NEGATIVE

## 2019-01-25 MED ORDER — SULFAMETHOXAZOLE-TRIMETHOPRIM 800-160 MG PO TABS: 1.0000 | ORAL_TABLET | Freq: Two times a day (BID) | ORAL | 0 refills | Status: AC

## 2019-01-25 MED ORDER — PHENAZOPYRIDINE HCL 200 MG PO TABS: 200.0000 mg | ORAL_TABLET | Freq: Three times a day (TID) | ORAL | 0 refills | Status: AC
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
## 2019-01-25 NOTE — Discharge Instructions (Signed)
You were seen in the emergency department for symptoms of a urinary tract infection.  Your urinalysis showed signs of infection.  We are starting you on some antibiotics and some medicine to help with the spasm.  He should drink plenty of fluids.  We also did a pelvic exam and sent some test that will take a few days to result.  We will call you if there is any need to change antibiotics.

## 2019-01-25 NOTE — ED Triage Notes (Signed)
Patient presents with complaints of urinary sx. nad noted

## 2019-01-26 LAB — GC/CHLAMYDIA PROBE AMP (~~LOC~~) NOT AT ARMC
Chlamydia: NEGATIVE
Neisseria Gonorrhea: NEGATIVE

## 2019-01-27 LAB — URINE CULTURE: Culture: >=100000 — AB

## 2019-01-28 ENCOUNTER — Telehealth: Payer: Self-pay | Admitting: Emergency Medicine

## 2019-01-28 NOTE — Telephone Encounter (Signed)
Post ED Visit - Positive Culture Follow-up  Culture report reviewed by antimicrobial stewardship pharmacist: [place] [facility] []  Nathan Batchelder, Pharm.D. []  [address], Pharm.D., BCPS AQ-ID []  Heide Guile, Pharm.D., BCPS []  Parks Neptune, Pharm.D., BCPS []  [place], Pharm.D., BCPS, AAHIVP []  [place], Pharm.D., BCPS, AAHIVP []  Legrand Como, PharmD, BCPS []  Salome Arnt, PharmD, BCPS []  Johnnette Gourd, PharmD, BCPS [x]  Hughes Better, PharmD []  Oletha Cruel, PharmD, BCPS []  Laqueta Linden, PharmD  [place] Team []  [address], PharmD []  Leodis Sias, PharmD []  Lindell Spar, PharmD []  Royetta Asal, Rph []  Graylin Shiver) Rema Fendt, PharmD []  Glennon Mac, PharmD []  Arlyn Dunning, PharmD []  Netta Cedars, PharmD []  Dia Sitter, PharmD []  Leone Haven, PharmD []  Gretta Arab, PharmD []  Theodis Shove, PharmD []  Peggyann Juba, PharmD   Positive urine culture Treated with Sulfamethoxazole-Trimethoprim, organism sensitive to the same and no further patient follow-up is required at this time.  Stacy Pierce 01/28/2019, 3:46 PM
